# Patient Record
Sex: Male | Born: 1950
Health system: Southern US, Community
[De-identification: ages and names within clinical notes are randomized; demographics above are authoritative.]

## PROBLEM LIST (undated history)

## (undated) DIAGNOSIS — F329 Major depressive disorder, single episode, unspecified: Secondary | ICD-10-CM

## (undated) DIAGNOSIS — F419 Anxiety disorder, unspecified: Secondary | ICD-10-CM

## (undated) DIAGNOSIS — Z8546 Personal history of malignant neoplasm of prostate: Secondary | ICD-10-CM

## (undated) DIAGNOSIS — C801 Malignant (primary) neoplasm, unspecified: Secondary | ICD-10-CM

## (undated) DIAGNOSIS — R011 Cardiac murmur, unspecified: Secondary | ICD-10-CM

## (undated) DIAGNOSIS — K219 Gastro-esophageal reflux disease without esophagitis: Secondary | ICD-10-CM

## (undated) DIAGNOSIS — F4001 Agoraphobia with panic disorder: Secondary | ICD-10-CM

## (undated) DIAGNOSIS — L570 Actinic keratosis: Secondary | ICD-10-CM

## (undated) DIAGNOSIS — K769 Liver disease, unspecified: Secondary | ICD-10-CM

## (undated) DIAGNOSIS — F32A Depression, unspecified: Secondary | ICD-10-CM

## (undated) DIAGNOSIS — N289 Disorder of kidney and ureter, unspecified: Secondary | ICD-10-CM

## (undated) HISTORY — DX: Major depressive disorder, single episode, unspecified: F32.9

## (undated) HISTORY — DX: Cardiac murmur, unspecified: R01.1

## (undated) HISTORY — DX: Agoraphobia with panic disorder: F40.01

## (undated) HISTORY — DX: Personal history of malignant neoplasm of prostate: Z85.46

## (undated) HISTORY — DX: Depression, unspecified: F32.A

## (undated) HISTORY — PX: PROSTATE SURGERY: SHX751

## (undated) HISTORY — DX: Malignant (primary) neoplasm, unspecified: C80.1

## (undated) HISTORY — DX: Actinic keratosis: L57.0

## (undated) HISTORY — DX: Anxiety disorder, unspecified: F41.9

## (undated) HISTORY — DX: Liver disease, unspecified: K76.9

## (undated) HISTORY — DX: Disorder of kidney and ureter, unspecified: N28.9

## (undated) HISTORY — DX: Gastro-esophageal reflux disease without esophagitis: K21.9

## (undated) HISTORY — PX: ANKLE SURGERY: SHX546

---

## 2005-02-02 ENCOUNTER — Encounter: Admission: RE | Admit: 2005-02-02 | Discharge: 2005-02-02 | Payer: Self-pay | Admitting: Neurological Surgery

## 2006-06-03 ENCOUNTER — Ambulatory Visit: Payer: Self-pay | Admitting: Family Medicine

## 2006-12-16 ENCOUNTER — Ambulatory Visit: Payer: Self-pay | Admitting: Gastroenterology

## 2006-12-16 LAB — HM COLONOSCOPY

## 2008-05-08 ENCOUNTER — Ambulatory Visit: Payer: Self-pay

## 2010-08-17 ENCOUNTER — Encounter: Payer: Self-pay | Admitting: Neurological Surgery

## 2010-11-18 ENCOUNTER — Emergency Department: Payer: Self-pay | Admitting: Emergency Medicine

## 2012-04-11 ENCOUNTER — Ambulatory Visit: Payer: Self-pay | Admitting: Family Medicine

## 2012-12-14 ENCOUNTER — Ambulatory Visit: Payer: Self-pay | Admitting: Family Medicine

## 2013-05-19 ENCOUNTER — Ambulatory Visit: Payer: Self-pay | Admitting: Family Medicine

## 2013-06-08 DIAGNOSIS — Z8546 Personal history of malignant neoplasm of prostate: Secondary | ICD-10-CM | POA: Insufficient documentation

## 2013-06-08 DIAGNOSIS — R339 Retention of urine, unspecified: Secondary | ICD-10-CM | POA: Insufficient documentation

## 2013-06-08 DIAGNOSIS — N529 Male erectile dysfunction, unspecified: Secondary | ICD-10-CM | POA: Insufficient documentation

## 2013-06-08 DIAGNOSIS — N393 Stress incontinence (female) (male): Secondary | ICD-10-CM | POA: Insufficient documentation

## 2013-06-09 ENCOUNTER — Encounter: Payer: Self-pay | Admitting: Cardiovascular Disease

## 2014-05-31 ENCOUNTER — Ambulatory Visit: Payer: Self-pay | Admitting: Family Medicine

## 2014-11-20 DIAGNOSIS — Z859 Personal history of malignant neoplasm, unspecified: Secondary | ICD-10-CM | POA: Insufficient documentation

## 2015-01-17 ENCOUNTER — Ambulatory Visit: Admitting: Psychiatry

## 2015-01-21 ENCOUNTER — Other Ambulatory Visit: Payer: Self-pay | Admitting: Psychiatry

## 2015-01-21 ENCOUNTER — Other Ambulatory Visit: Payer: Self-pay | Admitting: Family Medicine

## 2015-01-21 DIAGNOSIS — N289 Disorder of kidney and ureter, unspecified: Secondary | ICD-10-CM

## 2015-01-21 DIAGNOSIS — R739 Hyperglycemia, unspecified: Secondary | ICD-10-CM

## 2015-01-24 ENCOUNTER — Other Ambulatory Visit: Payer: Self-pay | Admitting: Family Medicine

## 2015-01-25 LAB — RENAL FUNCTION PANEL
ALBUMIN: 4.5 g/dL (ref 3.6–4.8)
BUN/Creatinine Ratio: 13 (ref 10–22)
BUN: 19 mg/dL (ref 8–27)
CO2: 25 mmol/L (ref 18–29)
CREATININE: 1.42 mg/dL — AB (ref 0.76–1.27)
Calcium: 9.8 mg/dL (ref 8.6–10.2)
Chloride: 97 mmol/L (ref 97–108)
GFR, EST AFRICAN AMERICAN: 60 mL/min/{1.73_m2} (ref >59)
GFR, EST NON AFRICAN AMERICAN: 52 mL/min/{1.73_m2} — AB (ref >59)
GLUCOSE: 98 mg/dL (ref 65–99)
PHOSPHORUS: 3.2 mg/dL (ref 2.5–4.5)
Potassium: 5.2 mmol/L (ref 3.5–5.2)
Sodium: 138 mmol/L (ref 134–144)

## 2015-01-31 ENCOUNTER — Other Ambulatory Visit: Payer: Self-pay

## 2015-01-31 ENCOUNTER — Encounter: Payer: Self-pay | Admitting: Psychiatry

## 2015-01-31 ENCOUNTER — Ambulatory Visit (INDEPENDENT_AMBULATORY_CARE_PROVIDER_SITE_OTHER): Payer: 59 | Admitting: Psychiatry

## 2015-01-31 VITALS — BP 138/82 | HR 82 | Temp 97.3°F | Ht 72.0 in | Wt 216.0 lb

## 2015-01-31 DIAGNOSIS — F4001 Agoraphobia with panic disorder: Secondary | ICD-10-CM | POA: Diagnosis not present

## 2015-01-31 DIAGNOSIS — F3342 Major depressive disorder, recurrent, in full remission: Secondary | ICD-10-CM | POA: Insufficient documentation

## 2015-01-31 DIAGNOSIS — F331 Major depressive disorder, recurrent, moderate: Secondary | ICD-10-CM | POA: Insufficient documentation

## 2015-01-31 MED ORDER — PROPRANOLOL HCL 10 MG PO TABS: 10.0000 mg | ORAL_TABLET | Freq: Two times a day (BID) | ORAL | Status: DC

## 2015-01-31 NOTE — Progress Notes (Signed)
BH MD/PA/NP OP Progress Note  01/31/2015 2:07 PM Richard Bean  MRN:  [id number]  Subjective:     Patient is a 64 year old male who presented for follow-up. He reported that he has been compliant with his medication and takes Lexapro on a regular basis. He reported that he stays at home and lives with his wife and mother-in-law. Patient reported that he continues to have panic attacks especially when he is been driving around and has to cross the bridge. 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 Patient reported that he sleeps well at night. He reported that he just hangs around and is trying to find and her part-time position. Patient reported that he does not have any other acute issues at this time. He is interested in medication management at this time.    Chief Complaint:  Chief Complaint    Follow-up; Anxiety     Visit Diagnosis:     ICD-9-CM ICD-10-CM   1. Panic disorder with agoraphobia and mild panic attacks 300.21 F40.01     Past Medical History:  Past Medical History  Diagnosis Date  . Depression   . Panic disorder with agoraphobia     Past Surgical History  Procedure Laterality Date  . Prostate surgery     Family History:  Family History  Problem Relation Age of Onset  . COPD Mother   . Prostate cancer Father   . Depression Sister   . Breast cancer Sister   . Alcohol abuse Brother    Social History:  History   Social History  . Marital Status: Married    Spouse Name: N/A  . Number of Children: N/A  . Years of Education: N/A   Social History Main Topics  . Smoking status: Former Smoker -- 1.50 packs/day for 30 years    Types: Cigarettes    Quit date: 01/31/1975  . Smokeless tobacco: Never Used  . Alcohol Use: 0.0 oz/week    0 Standard drinks or equivalent per week  . Drug Use: No  . Sexual Activity: Not on file   Other Topics Concern  . None   Social History Narrative   Additional History:  Lives with his wife.  Assessment:    Musculoskeletal: Strength & Muscle Tone: within normal limits Gait & Station: normal Patient leans: N/A  Psychiatric Specialty Exam: HPI  Review of Systems  Constitutional: Negative for chills.  HENT: Negative for hearing loss and nosebleeds.   Eyes: Negative for photophobia.  Respiratory: Negative for hemoptysis.   Cardiovascular: Negative for palpitations.  Gastrointestinal: Negative for abdominal pain.  Genitourinary: Negative for frequency.  Musculoskeletal: Positive for back pain, joint pain and neck pain.  Neurological: Negative for sensory change.  Endo/Heme/Allergies: Negative for environmental allergies.  Psychiatric/Behavioral: Negative for depression, hallucinations and substance abuse. The patient is nervous/anxious.     Blood pressure 138/82, pulse 82, temperature 97.3 F (36.3 C), temperature source Tympanic, height 6' (1.829 m), weight 216 lb (97.977 kg), SpO2 98 %.Body mass index is 29.29 kg/(m^2).  General Appearance: Casual  Eye Contact:  Fair  Speech:  Clear and Coherent  Volume:  Normal  Mood:  Anxious  Affect:  Congruent  Thought Process:  Intact  Orientation:  Full (Time, Place, and Person)  Thought Content:  WDL  Suicidal Thoughts:  No  Homicidal Thoughts:  No  Memory:  Immediate;   Fair  Judgement:  Fair  Insight:  Fair  Psychomotor Activity:  Normal  Concentration:  Fair  Recall:  [place] of Knowledge: Fair  Language: Fair  Akathisia:  No  Handed:  Right  AIMS (if indicated):    Assets:  Communication Skills Desire for Improvement Physical Health  ADL's:  Intact  Cognition: WNL  Sleep:     Is the patient at risk to self?  No. Has the patient been a risk to self in the past 6 months?  No. Has the patient been a risk to self within the distant past?  No. Is the patient a risk to others?  No. Has the patient been a risk to others in the past 6 months?  No. Has the patient been a risk to others within the distant past?   No.  Current Medications: Current Outpatient Prescriptions  Medication Sig Dispense Refill  . escitalopram (LEXAPRO) 20 MG tablet Take 1 tablet (20 mg total) by mouth daily. 90 tablet 1  . tadalafil (CIALIS) 5 MG tablet Take by mouth.     No current facility-administered medications for this visit.    Medical Decision Making:  Established Problem, Stable/Improving (1), Review and summation of old records (2) and Review of Last Therapy Session (1)  Treatment Plan Summary:Medication management   Discussed with patient what the medications and will start him on propranolol when necessary for his panic attacks and discussed with him about the side effects in detail and he demonstrated understanding. He reported that he does not take any Xanax at this time. Follow-up in 2 months or earlier   More than 50% of the time spent in psychoeducation, counseling and coordination of care.    This note was generated in part or whole with voice recognition software. Voice regonition is usually quite accurate but there are transcription errors that can and very often do occur. I apologize for any typographical errors that were not detected and corrected.   Rainey Pines 01/31/2015, 2:07 PM

## 2015-02-01 ENCOUNTER — Telehealth: Payer: Self-pay

## 2015-02-01 NOTE — Telephone Encounter (Signed)
Patient has been advised

## 2015-02-01 NOTE — Telephone Encounter (Signed)
-----   Message from Carmon Ginsberg, [place] sent at 01/31/2015  4:13 PM EDT ----- Kidney function has improved a bit and is stable. Would repeat labs in 6 months.p

## 2015-02-13 ENCOUNTER — Telehealth: Payer: Self-pay

## 2015-02-13 NOTE — Telephone Encounter (Signed)
received a fax wanting clarification on medication propranolol 10 mg  per dr. Gretel Acre she responsed (please dispanse 60 tablets pt will take 1-2 tablets prm)

## 2015-03-26 ENCOUNTER — Other Ambulatory Visit: Payer: Self-pay | Admitting: Psychiatry

## 2015-04-04 ENCOUNTER — Ambulatory Visit: Payer: Self-pay | Admitting: Psychiatry

## 2015-05-06 ENCOUNTER — Encounter: Payer: Self-pay | Admitting: Psychiatry

## 2015-05-06 ENCOUNTER — Ambulatory Visit (INDEPENDENT_AMBULATORY_CARE_PROVIDER_SITE_OTHER): Payer: 59 | Admitting: Psychiatry

## 2015-05-06 VITALS — BP 122/92 | HR 60 | Temp 97.3°F | Ht 72.0 in | Wt 218.2 lb

## 2015-05-06 DIAGNOSIS — F4001 Agoraphobia with panic disorder: Secondary | ICD-10-CM | POA: Diagnosis not present

## 2015-05-06 MED ORDER — ESCITALOPRAM OXALATE 20 MG PO TABS: 20.0000 mg | ORAL_TABLET | Freq: Every day | ORAL | Status: DC

## 2015-05-06 MED ORDER — PROPRANOLOL HCL 10 MG PO TABS: 10.0000 mg | ORAL_TABLET | Freq: Two times a day (BID) | ORAL | Status: DC | PRN

## 2015-05-06 NOTE — Progress Notes (Signed)
BH MD/PA/NP OP Progress Note  05/06/2015 1:39 PM Richard Bean  MRN:  [id number]  Subjective:     Patient is a 64 year old male who presented for follow-up. He reported that he has been compliant with his medication and  continues to have occasional panic attacks. He reported that he is able to control them. He reported that he will feel the panic attack while he is driving his car. Patient stated that he is taking propranolol on a when necessary basis. He is still living with his wife and mother-in-law. He appears happy during the interview. He stated that her Lexapro is helping him and he is taking it at bedtime. He currently denied having any adverse effects of the medication. He appeared calm and cooperative during the interview. He currently denied having any paranoia. He denied having any mood swings anger anxiety he sleeps well at night.    Chief Complaint:  Chief Complaint    Follow-up; Medication Refill     Visit Diagnosis:     ICD-9-CM ICD-10-CM   1. Panic disorder with agoraphobia 300.21 F40.01     Past Medical History:  Past Medical History  Diagnosis Date  . Depression   . Panic disorder with agoraphobia   . Cancer ([place])   . Anxiety     Past Surgical History  Procedure Laterality Date  . Prostate surgery    . Ankle surgery Right    Family History:  Family History  Problem Relation Age of Onset  . COPD Mother   . Prostate cancer Father   . Cancer - Lung Father   . Depression Sister   . Stomach cancer Sister   . Alcohol abuse Brother   . Heart block Brother   . Breast cancer Sister   . Prostate cancer Brother   . Skin cancer Brother    Social History:  Social History   Social History  . Marital Status: Married    Spouse Name: N/A  . Number of Children: N/A  . Years of Education: N/A   Social History Main Topics  . Smoking status: Current Every Day Smoker -- 1.00 packs/day for 30 years    Types: Cigarettes    Start date: 05/06/1971  . Smokeless  tobacco: Never Used  . Alcohol Use: 6.6 oz/week    0 Standard drinks or equivalent, 10 Shots of liquor, 1 Cans of beer per week  . Drug Use: No  . Sexual Activity: No   Other Topics Concern  . None   Social History Narrative   Additional History:  Lives with his wife.  Assessment:   Musculoskeletal: Strength & Muscle Tone: within normal limits Gait & Station: normal Patient leans: N/A  Psychiatric Specialty Exam: HPI   Review of Systems  Constitutional: Negative for chills.  HENT: Negative for hearing loss and nosebleeds.   Eyes: Negative for photophobia.  Respiratory: Negative for hemoptysis.   Cardiovascular: Negative for palpitations.  Gastrointestinal: Negative for abdominal pain.  Genitourinary: Negative for frequency.  Musculoskeletal: Positive for back pain, joint pain and neck pain.  Neurological: Negative for sensory change.  Endo/Heme/Allergies: Negative for environmental allergies.  Psychiatric/Behavioral: Positive for depression. Negative for hallucinations and substance abuse. The patient is nervous/anxious.   All other systems reviewed and are negative.   Blood pressure 122/92, pulse 60, temperature 97.3 F (36.3 C), temperature source Tympanic, height 6' (1.829 m), weight 218 lb 3.2 oz (98.975 kg), SpO2 97 %.Body mass index is 29.59 kg/(m^2).  General Appearance: Casual  Eye  Contact:  Fair  Speech:  Clear and Coherent  Volume:  Normal  Mood:  Anxious  Affect:  Congruent  Thought Process:  Intact  Orientation:  Full (Time, Place, and Person)  Thought Content:  WDL  Suicidal Thoughts:  No  Homicidal Thoughts:  No  Memory:  Immediate;   Fair  Judgement:  Fair  Insight:  Fair  Psychomotor Activity:  Normal  Concentration:  Fair  Recall:  [employer] of Knowledge: Fair  Language: Fair  Akathisia:  No  Handed:  Right  AIMS (if indicated):    Assets:  Communication Skills Desire for Improvement Physical Health  ADL's:  Intact  Cognition: WNL   Sleep:     Is the patient at risk to self?  No. Has the patient been a risk to self in the past 6 months?  No. Has the patient been a risk to self within the distant past?  No. Is the patient a risk to others?  No. Has the patient been a risk to others in the past 6 months?  No. Has the patient been a risk to others within the distant past?  No.  Current Medications: Current Outpatient Prescriptions  Medication Sig Dispense Refill  . escitalopram (LEXAPRO) 20 MG tablet Take 1 tablet (20 mg total) by mouth daily. 90 tablet 1  . imipramine (TOFRANIL) 25 MG tablet Take 25 mg by mouth at bedtime.    . propranolol (INDERAL) 10 MG tablet Take 1 tablet by mouth  daily as needed for anxiety and titrate up to 1 tablet  two times daily as directed 120 tablet 0  . tadalafil (CIALIS) 5 MG tablet Take by mouth.     No current facility-administered medications for this visit.    Medical Decision Making:  Established Problem, Stable/Improving (1), Review and summation of old records (2) and Review of Last Therapy Session (1)  Treatment Plan Summary:Medication management   Anxiety symptoms Patient will continue on Lexapro 20 mg by mouth daily as well as on propranolol when necessary for his panic attacks. Discussed with him about the side effects of the medication and he demonstrated understanding   Follow-up in 4 months or earlier   More than 50% of the time spent in psychoeducation, counseling and coordination of care.  Time spent with the patient 25 minutes   This note was generated in part or whole with voice recognition software. Voice regonition is usually quite accurate but there are transcription errors that can and very often do occur. I apologize for any typographical errors that were not detected and corrected.   Rainey Pines 05/06/2015, 1:39 PM

## 2015-06-03 ENCOUNTER — Other Ambulatory Visit: Payer: Self-pay | Admitting: Psychiatry

## 2015-06-05 ENCOUNTER — Other Ambulatory Visit: Payer: Self-pay

## 2015-06-05 NOTE — Telephone Encounter (Signed)
received fax that rx last filled on  05-06-15 pt will not have enough medication to due until his next appt in  09-05-15 on propranolol 10mg 

## 2015-06-11 MED ORDER — PROPRANOLOL HCL 10 MG PO TABS: 10.0000 mg | ORAL_TABLET | Freq: Two times a day (BID) | ORAL | Status: DC | PRN

## 2015-06-17 ENCOUNTER — Encounter: Payer: Self-pay | Admitting: Family Medicine

## 2015-06-17 ENCOUNTER — Ambulatory Visit (INDEPENDENT_AMBULATORY_CARE_PROVIDER_SITE_OTHER): Payer: 59 | Admitting: Family Medicine

## 2015-06-17 VITALS — BP 130/76 | HR 102 | Temp 97.9°F | Resp 16 | Ht 72.0 in | Wt 222.8 lb

## 2015-06-17 DIAGNOSIS — Z23 Encounter for immunization: Secondary | ICD-10-CM | POA: Diagnosis not present

## 2015-06-17 DIAGNOSIS — Z0000 Encounter for general adult medical examination without abnormal findings: Secondary | ICD-10-CM

## 2015-06-17 DIAGNOSIS — R12 Heartburn: Secondary | ICD-10-CM | POA: Diagnosis not present

## 2015-06-17 NOTE — Progress Notes (Addendum)
Subjective:     Patient ID: Richard Bean, male   DOB: 1951/02/25, 64 y.o.   MRN: [id number]  HPI  Chief Complaint  Patient presents with  . Annual Exam    Patient comes in office today for his annual physical he states that he has no questions or concerns. Patient is due for today  Flu vaccine., Patient states that he has had Tdap was within the last ten years, last reported Shingles was 05/24/2014. 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   Continues to be followed by Dr. Gretel Acre for panic disorder and Dr. Jacqlyn Larsen for E.D., incontinence, and surveillance after prostate cancer. States he walks 1-2 miles daily with his dog.    Review of Systems General: Feeling well. Will defer pneumovax and Hep C screen until next year. Does not wish to stop smoking at this time (formerly smoked 2.5 ppd now 1 ppd.). Caffeine consumption is 3 cups of coffee daily. HEENT: regular dental visits and is pending eye exam. Cardiovascular: no chest pain, shortness of breath, or palpitations. Does not wish to be on medication for cholesterol so wishes to defer lipid screen. GI: Daily heartburn for which he takes 6 antacids daily. No change in bowel habits. Normal colonoscopy in 2008. GU: nocturia 0-1. no change in bladder habits  Psychiatric: not depressed. Stress over depressed mother-in-law living with them. Musculoskeletal: intermittent left hip pain.    Objective:   Physical Exam  Constitutional: He appears well-developed and well-nourished. No distress.  Eyes: PERRLA, EOMI Neck: no thyromegaly, tenderness or nodules, no cervical adenopathy, no carotid bruits ENT: TM's intact without inflammation; No tonsillar enlargement or exudate,. Lungs: Clear Heart : regularly irregular without murmur  Abd: bowel sounds present, soft, non-tender, no organomegaly Rectal: deferred to urology Extremities: no lateral left hip  tenderness; Can perform IR/ER without discomfort in groin area. Skin: no atypical lesions noted on his back.     Assessment:    1. Annual physical exam - Comprehensive metabolic panel  2. Need for influenza vaccination - Flu Vaccine QUAD 36+ mos IM  3. Heartburn    Plan:    Discussed use of Prevacid otc. Further f/u pending lab work.

## 2015-06-17 NOTE — Patient Instructions (Signed)
Discussed use of Prevacid 15 mg. two daily for 2-4 weeks for heartburn.

## 2015-06-18 NOTE — Addendum Note (Signed)
Addended by: Quay Burow on: 06/18/2015 07:38 AM   Modules accepted: Miquel Dunn

## 2015-06-26 NOTE — Progress Notes (Signed)
According to note on  05-06-15 pt to take lexapro and the inderal was refilled on 06-06-15

## 2015-07-09 DIAGNOSIS — Z0000 Encounter for general adult medical examination without abnormal findings: Secondary | ICD-10-CM | POA: Diagnosis not present

## 2015-07-10 ENCOUNTER — Telehealth: Payer: Self-pay

## 2015-07-10 LAB — COMPREHENSIVE METABOLIC PANEL
ALK PHOS: 113 IU/L (ref 39–117)
ALT: 18 IU/L (ref 0–44)
AST: 19 IU/L (ref 0–40)
Albumin/Globulin Ratio: 2 (ref 1.1–2.5)
Albumin: 4 g/dL (ref 3.6–4.8)
BUN/Creatinine Ratio: 11 (ref 10–22)
BUN: 20 mg/dL (ref 8–27)
Bilirubin Total: 0.3 mg/dL (ref 0.0–1.2)
CALCIUM: 9.9 mg/dL (ref 8.6–10.2)
CO2: 26 mmol/L (ref 18–29)
CREATININE: 1.76 mg/dL — AB (ref 0.76–1.27)
Chloride: 98 mmol/L (ref 96–106)
GFR calc Af Amer: 46 mL/min/{1.73_m2} — ABNORMAL LOW (ref >59)
GFR, EST NON AFRICAN AMERICAN: 40 mL/min/{1.73_m2} — AB (ref >59)
GLUCOSE: 94 mg/dL (ref 65–99)
Globulin, Total: 2 g/dL (ref 1.5–4.5)
Potassium: 4.3 mmol/L (ref 3.5–5.2)
Sodium: 139 mmol/L (ref 134–144)
Total Protein: 6 g/dL (ref 6.0–8.5)

## 2015-07-10 NOTE — Telephone Encounter (Signed)
LMTCB

## 2015-07-10 NOTE — Telephone Encounter (Signed)
-----   Message from Carmon Ginsberg, [place] sent at 07/10/2015  7:57 AM EST ----- Kidney function has declined again. Are you regularly using antiinflammatory medication like aleve or ibuprofen which may affect kidney function? If not would recommend seeing a kidney doctor to sort this out. You don't have the usual risk factors-diabetes or hypertension-which usually contribute to kidney decline.

## 2015-07-15 ENCOUNTER — Other Ambulatory Visit: Payer: Self-pay | Admitting: Family Medicine

## 2015-07-15 DIAGNOSIS — N183 Chronic kidney disease, stage 3 unspecified: Secondary | ICD-10-CM

## 2015-07-15 NOTE — Telephone Encounter (Signed)
Referral in progress. 

## 2015-07-15 NOTE — Telephone Encounter (Signed)
Patient was advised of report he states that he stopped taking antiinflammatories years ago, he would like to proceed with seeing a kidney doctor to discuss issue. KW

## 2015-08-06 DIAGNOSIS — I129 Hypertensive chronic kidney disease with stage 1 through stage 4 chronic kidney disease, or unspecified chronic kidney disease: Secondary | ICD-10-CM | POA: Diagnosis not present

## 2015-08-06 DIAGNOSIS — N183 Chronic kidney disease, stage 3 (moderate): Secondary | ICD-10-CM | POA: Diagnosis not present

## 2015-08-07 DIAGNOSIS — N183 Chronic kidney disease, stage 3 (moderate): Secondary | ICD-10-CM | POA: Diagnosis not present

## 2015-09-05 ENCOUNTER — Ambulatory Visit (INDEPENDENT_AMBULATORY_CARE_PROVIDER_SITE_OTHER): Payer: Medicare Other | Admitting: Psychiatry

## 2015-09-05 ENCOUNTER — Encounter: Payer: Self-pay | Admitting: Psychiatry

## 2015-09-05 VITALS — BP 148/90 | HR 48 | Temp 98.1°F | Ht 72.0 in | Wt 227.0 lb

## 2015-09-05 DIAGNOSIS — F4001 Agoraphobia with panic disorder: Secondary | ICD-10-CM

## 2015-09-05 MED ORDER — ESCITALOPRAM OXALATE 20 MG PO TABS: 20.0000 mg | ORAL_TABLET | Freq: Every day | ORAL | Status: DC

## 2015-09-05 NOTE — Progress Notes (Signed)
BH MD/PA/NP OP Progress Note  09/05/2015 1:35 PM HA KRENGEL  MRN:  [id number]  Subjective:     Patient is a 65 year old male who presented for follow-up. He reported that he has been compliant with his medication and  he feels improvement in his panic attacks. 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 He reported that his last panic attack was while driving to [place]. He reported that he forgot to take the propranolol which was prescribed at his last appointment. He still has several pills. Patient reported that he takes Lexapro at night. He appeared calm and cooperative during the interview. He currently denied having any adverse effects of the medications He is still living with his wife and mother-in-law. He appears happy during the interview. He stated that her Lexapro is helping him and he is taking it at bedtime. He currently denied having any paranoia. He denied having any mood swings anger anxiety he sleeps well at night. He stated that he does not have any suicidal ideations or plans.   Chief Complaint:  Chief Complaint    Follow-up; Medication Refill     Visit Diagnosis:     ICD-9-CM ICD-10-CM   1. Panic disorder with agoraphobia and mild panic attacks 300.21 F40.01     Past Medical History:  Past Medical History  Diagnosis Date  . Depression   . Panic disorder with agoraphobia   . Cancer ([place])   . Anxiety     Past Surgical History  Procedure Laterality Date  . Prostate surgery    . Ankle surgery Right    Family History:  Family History  Problem Relation Age of Onset  . COPD Mother   . Prostate cancer Father   . Cancer - Lung Father   . Depression Sister   . Stomach cancer Sister   . Alcohol abuse Brother   . Heart block Brother   . Breast cancer Sister   . Prostate cancer Brother   . Skin cancer Brother    Social History:  Social History   Social History  . Marital Status: Married    Spouse  Name: N/A  . Number of Children: N/A  . Years of Education: N/A   Social History Main Topics  . Smoking status: Current Every Day Smoker -- 1.00 packs/day for 30 years    Types: Cigarettes    Start date: 05/06/1971  . Smokeless tobacco: Never Used  . Alcohol Use: 6.6 oz/week    1 Cans of beer, 10 Shots of liquor, 0 Standard drinks or equivalent per week  . Drug Use: No  . Sexual Activity: No   Other Topics Concern  . None   Social History Narrative   Additional History:  Lives with his wife.  Assessment:   Musculoskeletal: Strength & Muscle Tone: within normal limits Gait & Station: normal Patient leans: N/A  Psychiatric Specialty Exam: HPI   Review of Systems  Constitutional: Negative for chills.  HENT: Negative for hearing loss and nosebleeds.   Eyes: Negative for photophobia.  Respiratory: Negative for hemoptysis.   Cardiovascular: Negative for palpitations.  Gastrointestinal: Negative for abdominal pain.  Genitourinary: Negative for frequency.  Musculoskeletal: Positive for back pain, joint pain and neck pain.  Neurological: Negative for sensory change.  Endo/Heme/Allergies: Negative for environmental allergies.  Psychiatric/Behavioral: Positive for depression. Negative for hallucinations and substance abuse. The patient is nervous/anxious.   All other systems reviewed and are negative.   Blood  pressure 148/90, pulse 48, temperature 98.1 F (36.7 C), temperature source Tympanic, height 6' (1.829 m), weight 227 lb (102.967 kg), SpO2 98 %.Body mass index is 30.78 kg/(m^2).  General Appearance: Casual  Eye Contact:  Fair  Speech:  Clear and Coherent  Volume:  Normal  Mood:  Anxious  Affect:  Congruent  Thought Process:  Intact  Orientation:  Full (Time, Place, and Person)  Thought Content:  WDL  Suicidal Thoughts:  No  Homicidal Thoughts:  No  Memory:  Immediate;   Fair  Judgement:  Fair  Insight:  Fair  Psychomotor Activity:  Normal  Concentration:   Fair  Recall:  [employer] of Knowledge: Fair  Language: Fair  Akathisia:  No  Handed:  Right  AIMS (if indicated):    Assets:  Communication Skills Desire for Improvement Physical Health  ADL's:  Intact  Cognition: WNL  Sleep:     Is the patient at risk to self?  No. Has the patient been a risk to self in the past 6 months?  No. Has the patient been a risk to self within the distant past?  No. Is the patient a risk to others?  No. Has the patient been a risk to others in the past 6 months?  No. Has the patient been a risk to others within the distant past?  No.  Current Medications: Current Outpatient Prescriptions  Medication Sig Dispense Refill  . escitalopram (LEXAPRO) 20 MG tablet     . imipramine (TOFRANIL) 25 MG tablet Take 25 mg by mouth at bedtime.    . propranolol (INDERAL) 10 MG tablet Take 1 tablet by mouth two  times daily as needed 120 tablet 1  . tadalafil (CIALIS) 5 MG tablet Take by mouth.     No current facility-administered medications for this visit.    Medical Decision Making:  Established Problem, Stable/Improving (1), Review and summation of old records (2) and Review of Last Therapy Session (1)  Treatment Plan Summary:Medication management   Anxiety symptoms Patient will continue on Lexapro 20 mg by mouth daily as well as on propranolol when necessary for his panic attacks. 90 day supply of the Lexapro was filled. He has enough supply of propranolol at this time. Discussed with him about the side effects of the medication and he demonstrated understanding   Follow-up in 4 months or earlier   More than 50% of the time spent in psychoeducation, counseling and coordination of care.     This note was generated in part or whole with voice recognition software. Voice regonition is usually quite accurate but there are transcription errors that can and very often do occur. I apologize for any typographical errors that were not detected and corrected.    Rainey Pines, MD  09/05/2015, 1:35 PM

## 2015-09-06 DIAGNOSIS — N183 Chronic kidney disease, stage 3 (moderate): Secondary | ICD-10-CM | POA: Diagnosis not present

## 2015-09-06 DIAGNOSIS — I129 Hypertensive chronic kidney disease with stage 1 through stage 4 chronic kidney disease, or unspecified chronic kidney disease: Secondary | ICD-10-CM | POA: Diagnosis not present

## 2015-09-18 DIAGNOSIS — N393 Stress incontinence (female) (male): Secondary | ICD-10-CM | POA: Diagnosis not present

## 2015-09-18 DIAGNOSIS — R339 Retention of urine, unspecified: Secondary | ICD-10-CM | POA: Diagnosis not present

## 2015-09-18 DIAGNOSIS — Z8546 Personal history of malignant neoplasm of prostate: Secondary | ICD-10-CM | POA: Diagnosis not present

## 2015-09-18 DIAGNOSIS — N529 Male erectile dysfunction, unspecified: Secondary | ICD-10-CM | POA: Diagnosis not present

## 2015-09-20 DIAGNOSIS — I129 Hypertensive chronic kidney disease with stage 1 through stage 4 chronic kidney disease, or unspecified chronic kidney disease: Secondary | ICD-10-CM | POA: Diagnosis not present

## 2015-09-20 DIAGNOSIS — N183 Chronic kidney disease, stage 3 (moderate): Secondary | ICD-10-CM | POA: Diagnosis not present

## 2015-09-20 DIAGNOSIS — N400 Enlarged prostate without lower urinary tract symptoms: Secondary | ICD-10-CM | POA: Diagnosis not present

## 2015-10-15 ENCOUNTER — Encounter: Payer: Self-pay | Admitting: Family Medicine

## 2015-12-11 DIAGNOSIS — I129 Hypertensive chronic kidney disease with stage 1 through stage 4 chronic kidney disease, or unspecified chronic kidney disease: Secondary | ICD-10-CM | POA: Diagnosis not present

## 2015-12-11 DIAGNOSIS — N183 Chronic kidney disease, stage 3 (moderate): Secondary | ICD-10-CM | POA: Diagnosis not present

## 2016-01-02 ENCOUNTER — Ambulatory Visit (INDEPENDENT_AMBULATORY_CARE_PROVIDER_SITE_OTHER): Payer: Medicare Other | Admitting: Psychiatry

## 2016-01-02 VITALS — BP 130/76 | HR 92 | Temp 96.3°F | Ht 72.0 in | Wt 218.8 lb

## 2016-01-02 DIAGNOSIS — F4001 Agoraphobia with panic disorder: Secondary | ICD-10-CM

## 2016-01-02 MED ORDER — ESCITALOPRAM OXALATE 20 MG PO TABS: 20.0000 mg | ORAL_TABLET | Freq: Every day | ORAL | Status: DC

## 2016-01-02 NOTE — Progress Notes (Signed)
BH MD/PA/NP OP Progress Note  01/02/2016 1:38 PM Richard Bean  MRN:  [id number]  Subjective:     Patient is a 65 year old male who presented for follow-up. He reported that he has been compliant with his medication and he feels improvement in his panic attacks. He reported that he has not done any long road trips which leads to his panic attacks and anxiety. He reported that he is planning a trip in July to go to [place] to visit his friend. Patient reported that he has been following with his kidney doctor on a regular basis due to his kidney issues and hypertension. He has been compliant with his medications. He takes propanolol on a when necessary basis. He still has supply of the medication. He has been compliant with Lexapro and ran out of the medication yesterday. He appeared jovial and happy during the interview as usual. Does not have any sleep issues at this time. He denied having any more swings anger anxiety. He reported age he does not have any family history of psychiatric illness and his grandfather died at the age of 25 and his father died in 81s due to cancer.  He stated that he enjoys his life most of the time except for occasional anxiety and panic.  He denied having any paranoia. He  denied having any suicidal ideations or plans.  Chief Complaint:   Visit Diagnosis:     ICD-9-CM ICD-10-CM   1. Panic disorder with agoraphobia and mild panic attacks 300.21 F40.01     Past Medical History:  Past Medical History  Diagnosis Date  . Depression   . Panic disorder with agoraphobia   . Cancer ([place])   . Anxiety     Past Surgical History  Procedure Laterality Date  . Prostate surgery    . Ankle surgery Right    Family History:  Family History  Problem Relation Age of Onset  . COPD Mother   . Prostate cancer Father   . Cancer - Lung Father   . Depression Sister   . Stomach cancer Sister   . Alcohol abuse Brother   . Heart block Brother   . Breast cancer Sister   .  Prostate cancer Brother   . Skin cancer Brother    Social History:  Social History   Social History  . Marital Status: Married    Spouse Name: N/A  . Number of Children: N/A  . Years of Education: N/A   Social History Main Topics  . Smoking status: Current Every Day Smoker -- 1.00 packs/day for 30 years    Types: Cigarettes    Start date: 05/06/1971  . Smokeless tobacco: Never Used  . Alcohol Use: 6.6 oz/week    1 Cans of beer, 10 Shots of liquor, 0 Standard drinks or equivalent per week  . Drug Use: No  . Sexual Activity: No   Other Topics Concern  . Not on file   Social History Narrative   Additional History:  Lives with his wife.  Assessment:   Musculoskeletal: Strength & Muscle Tone: within normal limits Gait & Station: normal Patient leans: N/A  Psychiatric Specialty Exam: HPI  Review of Systems  Constitutional: Negative for chills.  HENT: Negative for hearing loss and nosebleeds.   Eyes: Negative for photophobia.  Respiratory: Negative for hemoptysis.   Cardiovascular: Negative for palpitations.  Gastrointestinal: Negative for abdominal pain.  Genitourinary: Negative for frequency.  Musculoskeletal: Positive for back pain, joint pain and neck pain.  Neurological:  Negative for sensory change.  Endo/Heme/Allergies: Negative for environmental allergies.  Psychiatric/Behavioral: Negative for hallucinations and substance abuse.  All other systems reviewed and are negative.   There were no vitals taken for this visit.There is no weight on file to calculate BMI.  General Appearance: Casual  Eye Contact:  Fair  Speech:  Clear and Coherent  Volume:  Normal  Mood:  Anxious  Affect:  Congruent  Thought Process:  Intact  Orientation:  Full (Time, Place, and Person)  Thought Content:  WDL  Suicidal Thoughts:  No  Homicidal Thoughts:  No  Memory:  Immediate;   Fair  Judgement:  Fair  Insight:  Fair  Psychomotor Activity:  Normal  Concentration:  Fair   Recall:  [employer] of Knowledge: Fair  Language: Fair  Akathisia:  No  Handed:  Right  AIMS (if indicated):    Assets:  Communication Skills Desire for Improvement Physical Health  ADL's:  Intact  Cognition: WNL  Sleep:     Is the patient at risk to self?  No. Has the patient been a risk to self in the past 6 months?  No. Has the patient been a risk to self within the distant past?  No. Is the patient a risk to others?  No. Has the patient been a risk to others in the past 6 months?  No. Has the patient been a risk to others within the distant past?  No.  Current Medications: Current Outpatient Prescriptions  Medication Sig Dispense Refill  . escitalopram (LEXAPRO) 20 MG tablet Take 1 tablet (20 mg total) by mouth daily. 90 tablet 3  . imipramine (TOFRANIL) 25 MG tablet Take 25 mg by mouth at bedtime.    . propranolol (INDERAL) 10 MG tablet Take 1 tablet by mouth two  times daily as needed 120 tablet 1  . tadalafil (CIALIS) 5 MG tablet Take by mouth.     No current facility-administered medications for this visit.    Medical Decision Making:  Established Problem, Stable/Improving (1), Review and summation of old records (2) and Review of Last Therapy Session (1)  Treatment Plan Summary:Medication management   Anxiety symptoms Patient will continue on Lexapro 20 mg by mouth daily as well as on propranolol when necessary for his panic attacks.  90 day supply of the Lexapro was filled. I will also prescribe 15 day supply of the Lexapro as he ran out of the medication to bridge the prescription before he can get his 90 day supply.  He has enough supply of propranolol at this time. Discussed with him about the side effects of the medication and he demonstrated understanding   Follow-up in 4 months or earlier   More than 50% of the time spent in psychoeducation, counseling and coordination of care.     This note was generated in part or whole with voice recognition  software. Voice regonition is usually quite accurate but there are transcription errors that can and very often do occur. I apologize for any typographical errors that were not detected and corrected.   Rainey Pines, MD  01/02/2016, 1:38 PM

## 2016-02-08 ENCOUNTER — Other Ambulatory Visit: Payer: Self-pay | Admitting: Psychiatry

## 2016-02-10 NOTE — Telephone Encounter (Signed)
Pt was given 90 days supply of medication  With 3 refills at last appt

## 2016-03-16 ENCOUNTER — Other Ambulatory Visit: Payer: Self-pay | Admitting: Family Medicine

## 2016-03-16 DIAGNOSIS — N5234 Erectile dysfunction following simple prostatectomy: Secondary | ICD-10-CM

## 2016-03-16 MED ORDER — TADALAFIL 5 MG PO TABS: 5.0000 mg | ORAL_TABLET | Freq: Every day | ORAL | 11 refills | Status: DC

## 2016-04-30 ENCOUNTER — Ambulatory Visit (INDEPENDENT_AMBULATORY_CARE_PROVIDER_SITE_OTHER): Payer: Medicare Other | Admitting: Psychiatry

## 2016-04-30 ENCOUNTER — Encounter: Payer: Self-pay | Admitting: Psychiatry

## 2016-04-30 VITALS — BP 150/79 | HR 48 | Temp 97.8°F | Wt 219.0 lb

## 2016-04-30 DIAGNOSIS — F4001 Agoraphobia with panic disorder: Secondary | ICD-10-CM

## 2016-04-30 MED ORDER — ESCITALOPRAM OXALATE 20 MG PO TABS: 20.0000 mg | ORAL_TABLET | Freq: Every day | ORAL | 3 refills | Status: DC

## 2016-04-30 NOTE — Progress Notes (Signed)
BH MD/PA/NP OP Progress Note  04/30/2016 1:28 PM Richard Bean  MRN:  [id number]  Subjective:     Patient is a 65 year old male who presented for follow-up. He reported that he has been compliant with his medication. Patient appeared happy and calm during the interview. He reported that he has been compliant with his medications and feels around the house. He was happy as usual. No acute symptoms noted during the interview. He sleeps well at night. He denied having any suicidal homicidal ideations or plans.  He currently lives with his wife and his mother-in-law. He has good relationship with them. He currently smokes 1 pack of cigarettes on a daily basis. We discussed about smoking cessation and he reported that he has tried in the past but was not successful. He does not want to stop smoking at this time.    He denied having any paranoia. He  denied having any suicidal ideations or plans.  Chief Complaint:  Chief Complaint    Follow-up; Medication Refill     Visit Diagnosis:   No diagnosis found.  Past Medical History:  Past Medical History:  Diagnosis Date  . Anxiety   . Cancer ([place])   . Depression   . Panic disorder with agoraphobia     Past Surgical History:  Procedure Laterality Date  . ANKLE SURGERY Right   . PROSTATE SURGERY     Family History:  Family History  Problem Relation Age of Onset  . COPD Mother   . Prostate cancer Father   . Cancer - Lung Father   . Depression Sister   . Stomach cancer Sister   . Alcohol abuse Brother   . Heart block Brother   . Breast cancer Sister   . Prostate cancer Brother   . Skin cancer Brother    Social History:  Social History   Social History  . Marital status: Married    Spouse name: N/A  . Number of children: N/A  . Years of education: N/A   Social History Main Topics  . Smoking status: Current Every Day Smoker    Packs/day: 1.00    Years: 30.00    Types: Cigarettes    Start date: 05/06/1971  . Smokeless  tobacco: Never Used  . Alcohol use 6.6 oz/week    1 Cans of beer, 10 Shots of liquor per week  . Drug use: No  . Sexual activity: No   Other Topics Concern  . None   Social History Narrative  . None   Additional History:  Lives with his wife.  Assessment:   Musculoskeletal: Strength & Muscle Tone: within normal limits Gait & Station: normal Patient leans: N/A  Psychiatric Specialty Exam: Medication Refill  Associated symptoms include neck pain. Pertinent negatives include no abdominal pain or chills.    Review of Systems  Constitutional: Negative for chills.  HENT: Negative for hearing loss and nosebleeds.   Eyes: Negative for photophobia.  Respiratory: Negative for hemoptysis.   Cardiovascular: Negative for palpitations.  Gastrointestinal: Negative for abdominal pain.  Genitourinary: Negative for frequency.  Musculoskeletal: Positive for back pain, joint pain and neck pain.  Neurological: Negative for sensory change.  Endo/Heme/Allergies: Negative for environmental allergies.  Psychiatric/Behavioral: Negative for hallucinations and substance abuse.  All other systems reviewed and are negative.   Blood pressure (!) 150/79, pulse (!) 48, temperature 97.8 F (36.6 C), temperature source Oral, weight 219 lb (99.3 kg).Body mass index is 29.7 kg/m.  General Appearance: Casual  Eye Contact:  Fair  Speech:  Clear and Coherent  Volume:  Normal  Mood:  Anxious  Affect:  Congruent  Thought Process:  Intact  Orientation:  Full (Time, Place, and Person)  Thought Content:  WDL  Suicidal Thoughts:  No  Homicidal Thoughts:  No  Memory:  Immediate;   Fair  Judgement:  Fair  Insight:  Fair  Psychomotor Activity:  Normal  Concentration:  Fair  Recall:  [employer] of Knowledge: Fair  Language: Fair  Akathisia:  No  Handed:  Right  AIMS (if indicated):    Assets:  Communication Skills Desire for Improvement Physical Health  ADL's:  Intact  Cognition: WNL  Sleep:      Is the patient at risk to self?  No. Has the patient been a risk to self in the past 6 months?  No. Has the patient been a risk to self within the distant past?  No. Is the patient a risk to others?  No. Has the patient been a risk to others in the past 6 months?  No. Has the patient been a risk to others within the distant past?  No.  Current Medications: Current Outpatient Prescriptions  Medication Sig Dispense Refill  . amLODipine (NORVASC) 5 MG tablet Take 5 mg by mouth daily.    . enalapril (VASOTEC) 5 MG tablet Take 5 mg by mouth daily.    Marland Kitchen escitalopram (LEXAPRO) 20 MG tablet Take 1 tablet (20 mg total) by mouth daily. 90 tablet 3  . escitalopram (LEXAPRO) 20 MG tablet Take 1 tablet (20 mg total) by mouth daily. 15 tablet 0  . imipramine (TOFRANIL) 25 MG tablet Take 25 mg by mouth at bedtime.    . propranolol (INDERAL) 10 MG tablet Take 1 tablet by mouth two  times daily as needed 120 tablet 1  . tadalafil (CIALIS) 5 MG tablet Take 1 tablet (5 mg total) by mouth daily. 30 tablet 11   No current facility-administered medications for this visit.     Medical Decision Making:  Established Problem, Stable/Improving (1), Review and summation of old records (2) and Review of Last Therapy Session (1)  Treatment Plan Summary:Medication management   Anxiety symptoms Patient will continue on Lexapro 20 mg by mouth daily.  90 day supply of the Lexapro was filled.   He has enough supply of propranolol at this time. Discussed with him about the side effects of the medication and he demonstrated understanding  Advised patient about my departure from this practice and he demonstrated understanding. He will make a follow-up appointment with the next provider.   Follow-up in 6 months or earlier   More than 50% of the time spent in psychoeducation, counseling and coordination of care.     This note was generated in part or whole with voice recognition software. Voice regonition is  usually quite accurate but there are transcription errors that can and very often do occur. I apologize for any typographical errors that were not detected and corrected.   Rainey Pines, MD  04/30/2016, 1:28 PM

## 2016-06-04 ENCOUNTER — Ambulatory Visit (INDEPENDENT_AMBULATORY_CARE_PROVIDER_SITE_OTHER): Payer: Medicare Other

## 2016-06-04 DIAGNOSIS — Z23 Encounter for immunization: Secondary | ICD-10-CM | POA: Diagnosis not present

## 2016-06-04 DIAGNOSIS — I129 Hypertensive chronic kidney disease with stage 1 through stage 4 chronic kidney disease, or unspecified chronic kidney disease: Secondary | ICD-10-CM | POA: Diagnosis not present

## 2016-06-04 DIAGNOSIS — N183 Chronic kidney disease, stage 3 (moderate): Secondary | ICD-10-CM | POA: Diagnosis not present

## 2016-07-30 ENCOUNTER — Ambulatory Visit (INDEPENDENT_AMBULATORY_CARE_PROVIDER_SITE_OTHER): Payer: Medicare Other | Admitting: Psychiatry

## 2016-07-30 ENCOUNTER — Encounter: Payer: Self-pay | Admitting: Psychiatry

## 2016-07-30 VITALS — BP 178/90 | HR 97 | Temp 97.6°F | Wt 227.0 lb

## 2016-07-30 DIAGNOSIS — F4001 Agoraphobia with panic disorder: Secondary | ICD-10-CM

## 2016-07-30 MED ORDER — ESCITALOPRAM OXALATE 20 MG PO TABS: 20.0000 mg | ORAL_TABLET | Freq: Every day | ORAL | 3 refills | Status: DC

## 2016-07-30 NOTE — Progress Notes (Signed)
BH MD/PA/NP OP Progress Note  07/30/2016 2:17 PM Richard Bean  MRN:  [id number]  Subjective:     Patient is a 66 year old male who presented for follow-up. He was very excited to see I'm still his outpatient provider. He reported that he was going to see another provider in the office. Patient reported that he did not enjoy over the holidays as he was spending time with his mother-in-law and she was acting like a "grinch". Patient continues to be pleasant and cooperative during the interview. He stated that he sleeps between 4-8 hours and sometimes feels tired. He has been compliant with his medications. He has been happy and jovial as usual. He denied having any suicidal homicidal ideations or plans. He reported that he goes out with his friends when the weather is nice. He does not have any acute symptoms at this time. He denied having any perceptual disturbances. He denied having any suicidal homicidal ideations or plans.    He currently smokes 1 pack of cigarettes on a daily basis.   Chief Complaint:  Chief Complaint    Follow-up; Medication Refill     Visit Diagnosis:   No diagnosis found.  Past Medical History:  Past Medical History:  Diagnosis Date  . Anxiety   . Cancer ([place])   . Depression   . Panic disorder with agoraphobia     Past Surgical History:  Procedure Laterality Date  . ANKLE SURGERY Right   . PROSTATE SURGERY     Family History:  Family History  Problem Relation Age of Onset  . COPD Mother   . Prostate cancer Father   . Cancer - Lung Father   . Depression Sister   . Stomach cancer Sister   . Alcohol abuse Brother   . Heart block Brother   . Breast cancer Sister   . Prostate cancer Brother   . Skin cancer Brother    Social History:  Social History   Social History  . Marital status: Married    Spouse name: N/A  . Number of children: N/A  . Years of education: N/A   Social History Main Topics  . Smoking status: Current Every Day Smoker   Packs/day: 1.00    Years: 30.00    Types: Cigarettes    Start date: 05/06/1971  . Smokeless tobacco: Never Used  . Alcohol use 6.6 oz/week    1 Cans of beer, 10 Shots of liquor per week  . Drug use: No  . Sexual activity: No   Other Topics Concern  . None   Social History Narrative  . None   Additional History:  Lives with his wife.  Assessment:   Musculoskeletal: Strength & Muscle Tone: within normal limits Gait & Station: normal Patient leans: N/A  Psychiatric Specialty Exam: Medication Refill  Associated symptoms include neck pain. Pertinent negatives include no abdominal pain or chills.    Review of Systems  Constitutional: Negative for chills.  HENT: Negative for hearing loss and nosebleeds.   Eyes: Negative for photophobia.  Respiratory: Negative for hemoptysis.   Cardiovascular: Negative for palpitations.  Gastrointestinal: Negative for abdominal pain.  Genitourinary: Negative for frequency.  Musculoskeletal: Positive for back pain, joint pain and neck pain.  Neurological: Negative for sensory change.  Endo/Heme/Allergies: Negative for environmental allergies.  Psychiatric/Behavioral: Negative for hallucinations and substance abuse.  All other systems reviewed and are negative.   Blood pressure (!) 178/90, pulse 97, temperature 97.6 F (36.4 C), temperature source Oral, weight 227 lb (  103 kg).Body mass index is 30.79 kg/m.  General Appearance: Casual  Eye Contact:  Fair  Speech:  Clear and Coherent  Volume:  Normal  Mood:  Anxious  Affect:  Congruent  Thought Process:  Intact  Orientation:  Full (Time, Place, and Person)  Thought Content:  WDL  Suicidal Thoughts:  No  Homicidal Thoughts:  No  Memory:  Immediate;   Fair  Judgement:  Fair  Insight:  Fair  Psychomotor Activity:  Normal  Concentration:  Fair  Recall:  [employer] of Knowledge: Fair  Language: Fair  Akathisia:  No  Handed:  Right  AIMS (if indicated):    Assets:  Communication  Skills Desire for Improvement Physical Health  ADL's:  Intact  Cognition: WNL  Sleep:     Is the patient at risk to self?  No. Has the patient been a risk to self in the past 6 months?  No. Has the patient been a risk to self within the distant past?  No. Is the patient a risk to others?  No. Has the patient been a risk to others in the past 6 months?  No. Has the patient been a risk to others within the distant past?  No.  Current Medications: Current Outpatient Prescriptions  Medication Sig Dispense Refill  . amLODipine (NORVASC) 5 MG tablet Take 5 mg by mouth daily.    . enalapril (VASOTEC) 5 MG tablet Take 5 mg by mouth daily.    Marland Kitchen escitalopram (LEXAPRO) 20 MG tablet Take 1 tablet (20 mg total) by mouth daily. 90 tablet 3  . imipramine (TOFRANIL) 25 MG tablet Take 25 mg by mouth at bedtime.    . propranolol (INDERAL) 10 MG tablet Take 1 tablet by mouth two  times daily as needed 120 tablet 1  . tadalafil (CIALIS) 5 MG tablet Take 1 tablet (5 mg total) by mouth daily. 30 tablet 11   No current facility-administered medications for this visit.     Medical Decision Making:  Established Problem, Stable/Improving (1), Review and summation of old records (2) and Review of Last Therapy Session (1)  Treatment Plan Summary:Medication management   Anxiety symptoms Patient will continue on Lexapro 20 mg by mouth daily.  90 day supply of the Lexapro was filled.   He has enough supply of propranolol at this time as he only takes it on a when necessary basis. Discussed with him about the side effects of the medication and he demonstrated understanding  Follow-up in 3 months or earlier   More than 50% of the time spent in psychoeducation, counseling and coordination of care.     This note was generated in part or whole with voice recognition software. Voice regonition is usually quite accurate but there are transcription errors that can and very often do occur. I apologize for any  typographical errors that were not detected and corrected.   Rainey Pines, MD  07/30/2016, 2:17 PM

## 2016-10-08 DIAGNOSIS — N529 Male erectile dysfunction, unspecified: Secondary | ICD-10-CM | POA: Diagnosis not present

## 2016-10-08 DIAGNOSIS — N393 Stress incontinence (female) (male): Secondary | ICD-10-CM | POA: Diagnosis not present

## 2016-10-08 DIAGNOSIS — R339 Retention of urine, unspecified: Secondary | ICD-10-CM | POA: Diagnosis not present

## 2016-10-08 DIAGNOSIS — Z8546 Personal history of malignant neoplasm of prostate: Secondary | ICD-10-CM | POA: Diagnosis not present

## 2016-10-12 ENCOUNTER — Encounter: Payer: Self-pay | Admitting: Family Medicine

## 2016-10-12 ENCOUNTER — Ambulatory Visit (INDEPENDENT_AMBULATORY_CARE_PROVIDER_SITE_OTHER): Payer: Medicare Other | Admitting: Family Medicine

## 2016-10-12 VITALS — BP 130/80 | HR 76 | Temp 97.8°F | Resp 16 | Ht 72.0 in | Wt 223.6 lb

## 2016-10-12 DIAGNOSIS — I129 Hypertensive chronic kidney disease with stage 1 through stage 4 chronic kidney disease, or unspecified chronic kidney disease: Secondary | ICD-10-CM | POA: Diagnosis not present

## 2016-10-12 DIAGNOSIS — Z0000 Encounter for general adult medical examination without abnormal findings: Secondary | ICD-10-CM | POA: Diagnosis not present

## 2016-10-12 DIAGNOSIS — F331 Major depressive disorder, recurrent, moderate: Secondary | ICD-10-CM

## 2016-10-12 DIAGNOSIS — Z13220 Encounter for screening for lipoid disorders: Secondary | ICD-10-CM

## 2016-10-12 DIAGNOSIS — Z8546 Personal history of malignant neoplasm of prostate: Secondary | ICD-10-CM | POA: Diagnosis not present

## 2016-10-12 DIAGNOSIS — Z131 Encounter for screening for diabetes mellitus: Secondary | ICD-10-CM

## 2016-10-12 DIAGNOSIS — M5030 Other cervical disc degeneration, unspecified cervical region: Secondary | ICD-10-CM | POA: Insufficient documentation

## 2016-10-12 DIAGNOSIS — Z23 Encounter for immunization: Secondary | ICD-10-CM | POA: Diagnosis not present

## 2016-10-12 DIAGNOSIS — N183 Chronic kidney disease, stage 3 unspecified: Secondary | ICD-10-CM

## 2016-10-12 DIAGNOSIS — Z720 Tobacco use: Secondary | ICD-10-CM | POA: Diagnosis not present

## 2016-10-12 DIAGNOSIS — I10 Essential (primary) hypertension: Secondary | ICD-10-CM | POA: Insufficient documentation

## 2016-10-12 MED ORDER — TRAMADOL HCL 50 MG PO TABS: 50.0000 mg | ORAL_TABLET | Freq: Four times a day (QID) | ORAL | 0 refills | Status: DC | PRN

## 2016-10-12 NOTE — Patient Instructions (Addendum)
We will call you with the lab results. Consider exercising 30 minutes daily. Use Tylenol for mild neck pain; tramadol for severe pain. Let me know if it works.

## 2016-10-12 NOTE — Progress Notes (Addendum)
Subjective:     Patient ID: Richard Bean, male   DOB: 1951/04/25, 66 y.o.   MRN: [id number]  HPI  Chief Complaint  Patient presents with  . Annual Exam    Patient comes in office today for his annual physical, he states that he is feeling well and has no conerns to address today. Patient is working on improving his diet, he is not actively exercising, patient averages between 5-8hrs of sleep at night. Patient is due today for TDap but has declined stating he has had in the last 10 yrs . Patient last reported colonoscopy 12/16/06, Flu vaccine 06/04/16, Zoster vaccine 05/24/14.   States he remains retired and is pending divorce from his wife: "I'm moving to a new house." He is up to date with urology, Dr. Jacqlyn Larsen; psychiatry, Dr.Faheem; and renal, Dr. [name].    Review of Systems General: Feeling well, does not wish to stop smoking at this time. HEENT: regular dental visits and is pending eye exam (glasses) Cardiovascular: no chest pain, shortness of breath, or palpitations GI: occasional heartburn he treats with antacids. no change in bowel habits. He is due his 10 year f/u colonoscopy but wishes to wait. GU: nocturia x0- 1, no change in bladder habits. Followed by urology Neuro: no change in memory Psychiatric: not depressed- followed by psychiatry Musculoskeletal: cervical DDD has worsened but non-radicular. Takes tylenol with modest relief. Can not take nsaid's due to CKD. Had a tripping episode at home two months ago with residual right chest wall pain.    Objective:   Physical Exam Eyes: PERRLA, EOMI Neck: no thyromegaly, tenderness or nodules, no cervical adenopathy, no carotid bruits ENT: TM's intact without inflammation; No tonsillar enlargement or exudate, Lungs: Clear Heart : RRR without murmur or gallop Abd: bowel sounds present, soft, non-tender, no organomegaly Extremities: no edema Neuro: 1.What year is it?          0 or 4-----0 2.What month is it?       0 or  3---0 3.Remember the following address (ask patient to repeat and remember for later)                           Janice Norrie                          [address] [place]  4.What time is it?          0 or 4------0 5. Count backwards from 20-1     0 or 2 (1 error) or 4-----0 6. Months of the year backwards       0 or 2 or 4-----0 7. Repeat previous memory phrase   0 or 3 (one error) or 4 (2 errors) or 6 (3 errors) or 8 (4 errors) or 10 (all incorrect)------0 ______________________________________________________________________ Total score: 0 0-7 normal   8-9 mild cognitive impairment    10-28 significant cognitive impairment    Assessment:    1. Medicare annual wellness visit, initial  2. Need for vaccination against Streptococcus pneumoniae - Pneumococcal conjugate vaccine 13-valent IM  3. Screening for diabetes mellitus - Comprehensive metabolic panel  4. Screening for cholesterol level - Lipid panel  5. H/O malignant neoplasm of prostate: Per urology  6. Tobacco use: not contemplating cessation  7. Depression, major, recurrent, moderate ([place]): per psychiatry  8. DDD (degenerative disc disease), cervical - traMADol (ULTRAM) 50 MG tablet; Take 1 tablet (50  mg total) by mouth every 6 (six) hours as needed for severe pain.  Dispense: 28 tablet; Refill: 0  9. Chronic kidney disease (CKD), stage III (moderate): per renal - Comprehensive metabolic panel 10. HTN: continue current medication    Plan:    Further f/u pending lab work. Encouraged exercising 30 minutes daily. Phone f/u regarding tramadol efficacy and tolerance.

## 2016-10-13 ENCOUNTER — Telehealth: Payer: Self-pay

## 2016-10-13 LAB — COMPREHENSIVE METABOLIC PANEL
ALK PHOS: 142 IU/L — AB (ref 39–117)
ALT: 29 IU/L (ref 0–44)
AST: 24 IU/L (ref 0–40)
Albumin/Globulin Ratio: 1.9 (ref 1.2–2.2)
Albumin: 4.9 g/dL — ABNORMAL HIGH (ref 3.6–4.8)
BILIRUBIN TOTAL: 0.5 mg/dL (ref 0.0–1.2)
BUN/Creatinine Ratio: 14 (ref 10–24)
BUN: 21 mg/dL (ref 8–27)
CHLORIDE: 97 mmol/L (ref 96–106)
CO2: 25 mmol/L (ref 18–29)
Calcium: 9.9 mg/dL (ref 8.6–10.2)
Creatinine, Ser: 1.48 mg/dL — ABNORMAL HIGH (ref 0.76–1.27)
GFR calc Af Amer: 56 mL/min/{1.73_m2} — ABNORMAL LOW (ref >59)
GFR calc non Af Amer: 49 mL/min/{1.73_m2} — ABNORMAL LOW (ref >59)
GLUCOSE: 99 mg/dL (ref 65–99)
Globulin, Total: 2.6 g/dL (ref 1.5–4.5)
Potassium: 5.1 mmol/L (ref 3.5–5.2)
Sodium: 139 mmol/L (ref 134–144)
Total Protein: 7.5 g/dL (ref 6.0–8.5)

## 2016-10-13 LAB — LIPID PANEL
CHOL/HDL RATIO: 3.7 ratio (ref 0.0–5.0)
Cholesterol, Total: 265 mg/dL — ABNORMAL HIGH (ref 100–199)
HDL: 72 mg/dL (ref >39)
LDL Calculated: 169 mg/dL — ABNORMAL HIGH (ref 0–99)
Triglycerides: 118 mg/dL (ref 0–149)
VLDL CHOLESTEROL CAL: 24 mg/dL (ref 5–40)

## 2016-10-13 NOTE — Telephone Encounter (Signed)
-----   Message from Carmon Ginsberg, [place] sent at 10/13/2016  7:48 AM EDT ----- Your labs look ok except mildly elevated cholesterol. Due to your age, smoking, and hx of high blood pressure your calculated risk of developing cardiovascular disease is 21.5 %. This is above the 7.5% when we usually recommend a cholesterol lowering drug. Do you wish to start a cholesterol medication? Also I did not add a Hepatitis C screen to your labs. Do you want this test? (If so, see if we can add to the current blood draw)

## 2016-10-13 NOTE — Telephone Encounter (Signed)
Patient advised as directed below. Per patient he does not want Cholesterol lowering medication at this time and does not want the Hepatitis C screening.  Thanks,  -Myrle Dues

## 2016-10-28 ENCOUNTER — Encounter: Payer: Self-pay | Admitting: Psychiatry

## 2016-10-28 ENCOUNTER — Ambulatory Visit (INDEPENDENT_AMBULATORY_CARE_PROVIDER_SITE_OTHER): Payer: Medicare Other | Admitting: Psychiatry

## 2016-10-28 VITALS — BP 147/82 | HR 103 | Temp 98.3°F | Wt 221.6 lb

## 2016-10-28 DIAGNOSIS — F4001 Agoraphobia with panic disorder: Secondary | ICD-10-CM

## 2016-10-28 DIAGNOSIS — F39 Unspecified mood [affective] disorder: Secondary | ICD-10-CM

## 2016-10-28 MED ORDER — ESCITALOPRAM OXALATE 10 MG PO TABS: 10.0000 mg | ORAL_TABLET | Freq: Every day | ORAL | 1 refills | Status: DC

## 2016-10-28 NOTE — Progress Notes (Signed)
BH MD/PA/NP OP Progress Note  10/28/2016 2:02 PM Richard Bean  MRN:  [id number]  Subjective:     Patient is a 66 year old male who presented for follow-up. He was very hyper and excited as usual. He was noted to be talking fast during the interview. He reported that he has recently moved to his new home in [place] and is is still unpacking although he has moved in family. Patient reported that he is taking for her medications at this time but does not remember the names of his medications. He reported that the medications are helping him. He is not driving out of town at this time. He stated that the medications are helping him. He is not having any panic attacks. He currently denied having any suicidal homicidal ideations or plans. Discussed about decreasing the dose of Lexapro as it appears that it is making him more hyper and " manic-like". However he became anxious as he feels that he will become more anxious and he was start driving. Advised patient that he has not driven for long distance since last July and he demonstrated understanding.  He denied having any perceptual disturbances. He denied having any suicidal homicidal ideations or plans.    He currently smokes 1 pack of cigarettes on a daily basis.   Chief Complaint:  Chief Complaint    Follow-up; Medication Refill     Visit Diagnosis:     ICD-9-CM ICD-10-CM   1. Panic disorder with agoraphobia and mild panic attacks 300.21 F40.01   2. Episodic mood disorder (HCC) 296.90 F39     Past Medical History:  Past Medical History:  Diagnosis Date  . Anxiety   . Cancer ([place])   . Depression   . Panic disorder with agoraphobia     Past Surgical History:  Procedure Laterality Date  . ANKLE SURGERY Right   . PROSTATE SURGERY     Family History:  Family History  Problem Relation Age of Onset  . COPD Mother   . Prostate cancer Father   . Cancer - Lung Father   . Depression Sister   . Stomach cancer Sister   . Alcohol abuse  Brother   . Heart block Brother   . Breast cancer Sister   . Prostate cancer Brother   . Skin cancer Brother    Social History:  Social History   Social History  . Marital status: Married    Spouse name: N/A  . Number of children: N/A  . Years of education: N/A   Social History Main Topics  . Smoking status: Current Every Day Smoker    Packs/day: 1.00    Years: 30.00    Types: Cigarettes    Start date: 05/06/1971  . Smokeless tobacco: Never Used  . Alcohol use 6.6 oz/week    1 Cans of beer, 10 Shots of liquor per week  . Drug use: No  . Sexual activity: No   Other Topics Concern  . None   Social History Narrative  . None   Additional History:  Lives with his wife.  Assessment:   Musculoskeletal: Strength & Muscle Tone: within normal limits Gait & Station: normal Patient leans: N/A  Psychiatric Specialty Exam: Medication Refill  Associated symptoms include neck pain. Pertinent negatives include no abdominal pain or chills.    Review of Systems  Constitutional: Negative for chills.  HENT: Negative for hearing loss and nosebleeds.   Eyes: Negative for photophobia.  Respiratory: Negative for hemoptysis.   Cardiovascular: Negative  for palpitations.  Gastrointestinal: Negative for abdominal pain.  Genitourinary: Negative for frequency.  Musculoskeletal: Positive for back pain, joint pain and neck pain.  Neurological: Negative for sensory change.  Endo/Heme/Allergies: Negative for environmental allergies.  Psychiatric/Behavioral: Negative for hallucinations and substance abuse.  All other systems reviewed and are negative.   Blood pressure (!) 147/82, pulse (!) 103, temperature 98.3 F (36.8 C), temperature source Oral, weight 221 lb 9.6 oz (100.5 kg).Body mass index is 30.05 kg/m.  General Appearance: Casual  Eye Contact:  Fair  Speech:  Clear and Coherent  Volume:  Normal  Mood:  Anxious  Affect:  Congruent  Thought Process:  Intact  Orientation:   Full (Time, Place, and Person)  Thought Content:  WDL  Suicidal Thoughts:  No  Homicidal Thoughts:  No  Memory:  Immediate;   Fair  Judgement:  Fair  Insight:  Fair  Psychomotor Activity:  Normal  Concentration:  Fair  Recall:  [employer] of Knowledge: Fair  Language: Fair  Akathisia:  No  Handed:  Right  AIMS (if indicated):    Assets:  Communication Skills Desire for Improvement Physical Health  ADL's:  Intact  Cognition: WNL  Sleep:     Is the patient at risk to self?  No. Has the patient been a risk to self in the past 6 months?  No. Has the patient been a risk to self within the distant past?  No. Is the patient a risk to others?  No. Has the patient been a risk to others in the past 6 months?  No. Has the patient been a risk to others within the distant past?  No.  Current Medications: Current Outpatient Prescriptions  Medication Sig Dispense Refill  . amLODipine (NORVASC) 5 MG tablet Take 5 mg by mouth daily.    . enalapril (VASOTEC) 5 MG tablet Take 5 mg by mouth daily.    Marland Kitchen escitalopram (LEXAPRO) 10 MG tablet Take 1 tablet (10 mg total) by mouth daily. 90 tablet 1  . imipramine (TOFRANIL) 25 MG tablet Take 25 mg by mouth at bedtime.    . traMADol (ULTRAM) 50 MG tablet Take 1 tablet (50 mg total) by mouth every 6 (six) hours as needed for severe pain. 28 tablet 0   No current facility-administered medications for this visit.     Medical Decision Making:  Established Problem, Stable/Improving (1), Review and summation of old records (2) and Review of Last Therapy Session (1)  Treatment Plan Summary:Medication management   Anxiety symptoms I will decrease the dose of Lexapro 10 mg daily.  90 day supply of the Lexapro was filled.   Will discontinue propranolol as patient is not taking the medication at this time.   Follow-up in 3 months or earlier   More than 50% of the time spent in psychoeducation, counseling and coordination of care.     This note was  generated in part or whole with voice recognition software. Voice regonition is usually quite accurate but there are transcription errors that can and very often do occur. I apologize for any typographical errors that were not detected and corrected.   Rainey Pines, MD  10/28/2016, 2:02 PM

## 2016-11-30 DIAGNOSIS — N183 Chronic kidney disease, stage 3 (moderate): Secondary | ICD-10-CM | POA: Diagnosis not present

## 2016-11-30 DIAGNOSIS — I129 Hypertensive chronic kidney disease with stage 1 through stage 4 chronic kidney disease, or unspecified chronic kidney disease: Secondary | ICD-10-CM | POA: Diagnosis not present

## 2017-01-11 ENCOUNTER — Ambulatory Visit (INDEPENDENT_AMBULATORY_CARE_PROVIDER_SITE_OTHER): Payer: Medicare Other | Admitting: Physician Assistant

## 2017-01-11 ENCOUNTER — Encounter: Payer: Self-pay | Admitting: Physician Assistant

## 2017-01-11 VITALS — BP 138/70 | HR 84 | Temp 98.4°F | Resp 16 | Wt 220.0 lb

## 2017-01-11 DIAGNOSIS — I129 Hypertensive chronic kidney disease with stage 1 through stage 4 chronic kidney disease, or unspecified chronic kidney disease: Secondary | ICD-10-CM | POA: Diagnosis not present

## 2017-01-11 DIAGNOSIS — I491 Atrial premature depolarization: Secondary | ICD-10-CM

## 2017-01-11 DIAGNOSIS — R42 Dizziness and giddiness: Secondary | ICD-10-CM

## 2017-01-11 NOTE — Progress Notes (Addendum)
Patient: Richard Bean Male    DOB: [birth year]   66 y.o.   MRN: [id number] Visit Date: 01/11/2017  Today's Provider: Trinna Post, PA-C   Chief Complaint  Patient presents with  . Dizziness    Started about 3-4 weeks ago.    Subjective:     Richard Bean is a 66 y/o man with history of anxiety, HTN, untreated HLD, one pack per day smoking and CKD III presenting today with light headedness ongoing for 3-4 weeks.   He takes 5 mg Norvasc and 5 mg enalapril daily for his HTN, as prescribed by his nephrologist. He thinks his symptoms are due to his blood pressure. He says his blood pressure has been fluctuating. He says he does not measure his blood pressure, but only goes by readings in office. Initial reading today was 116/62. Patient says this is abnormal for him. He says the light headedness happens at no particular time. It happened every other day for while, but most recently has moved to daily. It does not happen at rest or when he's moving his head. Most recently it happened when he was hanging his curtains. He doesn't have any chest pain. Some nausea. No diaphoresis, no headache. He denies blood loss including melena, hematochezia, hematemesis. Last colonoscopy 10+ years ago, has refused new one. Has HLD, refused statin. Says this is not normal for him, something is wrong. Denies any new anxiety or depression symptoms.   Dizziness  This is a new problem. The current episode started 1 to 4 weeks ago. The problem occurs daily. The problem has been gradually worsening. Associated symptoms include nausea. Pertinent negatives include no abdominal pain, chest pain, headaches, numbness, vomiting or weakness. Nothing aggravates the symptoms. He has tried nothing for the symptoms.      Allergies  Allergen Reactions  . Chlorpheniramine-Phenylephrine      Current Outpatient Prescriptions:  .  amLODipine (NORVASC) 5 MG tablet, Take 5 mg by mouth daily., Disp: , Rfl:  .  enalapril  (VASOTEC) 5 MG tablet, Take 5 mg by mouth daily., Disp: , Rfl:  .  escitalopram (LEXAPRO) 10 MG tablet, Take 1 tablet (10 mg total) by mouth daily., Disp: 90 tablet, Rfl: 1 .  imipramine (TOFRANIL) 25 MG tablet, Take 25 mg by mouth at bedtime., Disp: , Rfl:  .  traMADol (ULTRAM) 50 MG tablet, Take 1 tablet (50 mg total) by mouth every 6 (six) hours as needed for severe pain. (Patient not taking: Reported on 01/11/2017), Disp: 28 tablet, Rfl: 0  Review of Systems  Constitutional: Negative.   HENT: Positive for tinnitus (Chronic issue). Negative for ear discharge, ear pain and nosebleeds.   Respiratory: Negative for apnea, choking, chest tightness, shortness of breath, wheezing and stridor.   Cardiovascular: Positive for palpitations. Negative for chest pain and leg swelling.  Gastrointestinal: Positive for nausea. Negative for abdominal distention, abdominal pain, anal bleeding, blood in stool, constipation, diarrhea, rectal pain and vomiting.  Musculoskeletal: Negative for gait problem.  Neurological: Positive for dizziness and light-headedness. Negative for tremors, seizures, syncope, facial asymmetry, speech difficulty, weakness, numbness and headaches.  Hematological: Negative for adenopathy. Does not bruise/bleed easily.    Social History  Substance Use Topics  . Smoking status: Current Every Day Smoker    Packs/day: 1.00    Years: 30.00    Types: Cigarettes    Start date: 05/06/1971  . Smokeless tobacco: Never Used  . Alcohol use 6.6 oz/week  1 Cans of beer, 10 Shots of liquor per week   Objective:   BP 138/70 (BP Location: Left Arm, Patient Position: Sitting, Cuff Size: Normal)   Pulse 84   Temp 98.4 F (36.9 C) (Oral)   Resp 16   Wt 220 lb (99.8 kg)   BMI 29.84 kg/m  Vitals:   01/11/17 1335  BP: 138/70  Pulse: 84  Resp: 16  Temp: 98.4 F (36.9 C)  TempSrc: Oral  Weight: 220 lb (99.8 kg)     Physical Exam  Constitutional: He is oriented to person, place, and  time. He appears well-developed and well-nourished.  Orthostatic vital signs are normal.   HENT:  Mouth/Throat: Mucous membranes are normal. Mucous membranes are not pale and not dry.  Eyes: Conjunctivae are normal. Pupils are equal, round, and reactive to light. Right eye exhibits no nystagmus. Left eye exhibits no nystagmus.  Cardiovascular: Normal rate and regular rhythm.   Pulmonary/Chest: Effort normal and breath sounds normal.  Abdominal: Soft. Bowel sounds are normal.  Musculoskeletal: He exhibits no edema or tenderness.  No tenderness in deep venous system bilateral lower extremity.  Neurological: He is alert and oriented to person, place, and time.  Skin: Skin is warm and dry. No erythema.  Psychiatric: He has a normal mood and affect. His behavior is normal.  Vitals reviewed.       Assessment & Plan:     1. Light headedness  Orthostatics normal, though patient with some symptoms standing up. Will check for anemia as below. EKG with frequent PACs, will refer to cardiology for event monitoring. Untreated HLD with 21% risk of CV event in next 10 years. Patient refuses statin. BP normotensive in office and also while patient had symptoms. Encouraged fluids, hydration. Should follow up with Mikki Santee if needed, otherwise f/u with cardiology.  - CBC With Differential - EKG 12-Lead - Ambulatory referral to Cardiology  2. Hypertension, renal disease, stage 1-4 or unspecified chronic kidney disease   3. Premature atrial contraction  - Ambulatory referral to Cardiology  Return if symptoms worsen or fail to improve.  The entirety of the information documented in the History of Present Illness, Review of Systems and Physical Exam were personally obtained by me. Portions of this information were initially documented by Ashley Royalty, CMA and reviewed by me for thoroughness and accuracy.   I have spent 25 minutes with this patient, >50% of which was spent on counseling and coordination of  care.       Trinna Post, PA-C  [facility]

## 2017-01-11 NOTE — Patient Instructions (Signed)
Dizziness Dizziness is a common problem. It is a feeling of unsteadiness or light-headedness. You may feel like you are about to faint. Dizziness can lead to injury if you stumble or fall. Anyone can become dizzy, but dizziness is more common in older adults. This condition can be caused by a number of things, including medicines, dehydration, or illness. Follow these instructions at home: Taking these steps may help with your condition: Eating and drinking   Drink enough fluid to keep your urine clear or pale yellow. This helps to keep you from becoming dehydrated. Try to drink more clear fluids, such as water.  Do not drink alcohol.  Limit your caffeine intake if directed by your health care provider.  Limit your salt intake if directed by your health care provider. Activity   Avoid making quick movements.  Rise slowly from chairs and steady yourself until you feel okay.  In the morning, first sit up on the side of the bed. When you feel okay, stand slowly while you hold onto something until you know that your balance is fine.  Move your legs often if you need to stand in one place for a long time. Tighten and relax your muscles in your legs while you are standing.  Do not drive or operate heavy machinery if you feel dizzy.  Avoid bending down if you feel dizzy. Place items in your home so that they are easy for you to reach without leaning over. Lifestyle   Do not use any tobacco products, including cigarettes, chewing tobacco, or electronic cigarettes. If you need help quitting, ask your health care provider.  Try to reduce your stress level, such as with yoga or meditation. Talk with your health care provider if you need help. General instructions   Watch your dizziness for any changes.  Take medicines only as directed by your health care provider. Talk with your health care provider if you think that your dizziness is caused by a medicine that you are taking.  Tell a friend  or a family member that you are feeling dizzy. If he or she notices any changes in your behavior, have this person call your health care provider.  Keep all follow-up visits as directed by your health care provider. This is important. Contact a health care provider if:  Your dizziness does not go away.  Your dizziness or light-headedness gets worse.  You feel nauseous.  You have reduced hearing.  You have new symptoms.  You are unsteady on your feet or you feel like the room is spinning. Get help right away if:  You vomit or have diarrhea and are unable to eat or drink anything.  You have problems talking, walking, swallowing, or using your arms, hands, or legs.  You feel generally weak.  You are not thinking clearly or you have trouble forming sentences. It may take a friend or family member to notice this.  You have chest pain, abdominal pain, shortness of breath, or sweating.  Your vision changes.  You notice any bleeding.  You have a headache.  You have neck pain or a stiff neck.  You have a fever. This information is not intended to replace advice given to you by your health care provider. Make sure you discuss any questions you have with your health care provider. Document Released: 01/06/2001 Document Revised: 12/19/2015 Document Reviewed: 07/09/2014 Elsevier Interactive Patient Education  2017 [employer]  

## 2017-01-12 LAB — CBC WITH DIFFERENTIAL
Basophils Absolute: 0.1 10*3/uL (ref 0.0–0.2)
Basos: 1 %
EOS (ABSOLUTE): 0.2 10*3/uL (ref 0.0–0.4)
Eos: 2 %
Hematocrit: 46.7 % (ref 37.5–51.0)
Hemoglobin: 16.2 g/dL (ref 13.0–17.7)
Immature Grans (Abs): 0.1 10*3/uL (ref 0.0–0.1)
Immature Granulocytes: 1 %
Lymphocytes Absolute: 1.4 10*3/uL (ref 0.7–3.1)
Lymphs: 15 %
MCH: 30.5 pg (ref 26.6–33.0)
MCHC: 34.7 g/dL (ref 31.5–35.7)
MCV: 88 fL (ref 79–97)
Monocytes Absolute: 0.7 10*3/uL (ref 0.1–0.9)
Monocytes: 8 %
Neutrophils Absolute: 6.9 10*3/uL (ref 1.4–7.0)
Neutrophils: 73 %
RBC: 5.31 x10E6/uL (ref 4.14–5.80)
RDW: 14.2 % (ref 12.3–15.4)
WBC: 9.3 10*3/uL (ref 3.4–10.8)

## 2017-01-12 NOTE — Progress Notes (Signed)
Advised  ED 

## 2017-02-01 ENCOUNTER — Ambulatory Visit: Payer: Medicare Other | Admitting: Psychiatry

## 2017-02-05 NOTE — Progress Notes (Signed)
Cardiology Office Note  Date:  02/08/2017   ID:  Richard Bean, DOB Jun 08, 1951, MRN [id number]  PCP:  Richard Ginsberg, PA   Chief Complaint  Patient presents with  . other    Ref by Dr. Jaynie Bean for lightheaded and PAC's on recent EKG. Pt. c/o being lightheaded at times and tingling in left shoulder and arm. Meds reviewed by the pt. verbally.     HPI:  Richard Bean is a 66 y/o man with history of  anxiety,  HTN,  HLD, total cholesterol 265 one pack per day smoking  CKD III  creatinine 1.5  lightheadedness ongoing for 3-4 weeks. Orthostatics normal He presents today by referral from  Richard Bean for consultation of his  APCs and dizziness  He reports having dizzy spells over the past several months Calls them lightheaded spells Typically will happen when he is standing, Last episode was approximately 3 weeks ago, since then no further episodes Had episode was a "bad spell", lasted for  2 min  he was Opening curtains.  Appreciated severe lightheadedness/dizziness, Tunnel vision, just stood there Felt his bypass out Went away on its own  Previous episodes were not as severe but were similar No documentation of orthostasis on recent visit with primary care or again today  Blood pressure remaining 150s up to 160s when supine or standing, his no significant change in heart rate  Denies any shortness of breath or chest pain on exertion  EKG personally reviewed by myself on todays visit Shows normal sinus rhythm with rate 89 bpm no significant ST or T-wave changes, rare APC   Has HLD, refused statin in the past   PMH:   has a past medical history of Anxiety; Cancer ([place]); Depression; and Panic disorder with agoraphobia.  PSH:    Past Surgical History:  Procedure Laterality Date  . ANKLE SURGERY Right   . PROSTATE SURGERY      Current Outpatient Prescriptions  Medication Sig Dispense Refill  . amLODipine (NORVASC) 5 MG tablet Take 5 mg by mouth daily.    . enalapril  (VASOTEC) 5 MG tablet Take 5 mg by mouth daily.    Marland Kitchen escitalopram (LEXAPRO) 10 MG tablet Take 1 tablet (10 mg total) by mouth daily. 90 tablet 1  . imipramine (TOFRANIL) 25 MG tablet Take 25 mg by mouth at bedtime.    . sildenafil (REVATIO) 20 MG tablet Take 20 mg by mouth as directed.     No current facility-administered medications for this visit.      Allergies:   Chlorpheniramine-phenylephrine   Social History:  The patient  reports that he has been smoking Cigarettes.  He started smoking about 45 years ago. He has a 30.00 pack-year smoking history. He has never used smokeless tobacco. He reports that he drinks about 6.6 oz of alcohol per week . He reports that he does not use drugs.   Family History:   family history includes Alcohol abuse in his brother; Breast cancer in his sister; COPD in his mother; Cancer - Lung in his father; Depression in his sister; Heart block in his brother; Prostate cancer in his brother and father; Skin cancer in his brother; Stomach cancer in his sister.    Review of Systems: Review of Systems  Constitutional: Negative.   Respiratory: Negative.   Cardiovascular: Negative.   Gastrointestinal: Negative.   Musculoskeletal: Negative.   Neurological: Positive for dizziness.       Lightheaded, near syncope  Psychiatric/Behavioral: Negative.   All  other systems reviewed and are negative.    PHYSICAL EXAM: VS:  BP (!) 150/82 (BP Location: Right Arm, Patient Position: Sitting, Cuff Size: Normal)   Pulse 89   Ht 6' (1.829 m)   Wt 219 lb (99.3 kg)   BMI 29.70 kg/m  , BMI Body mass index is 29.7 kg/m. GEN: Well nourished, well developed, in no acute distress  HEENT: normal  Neck: no JVD, carotid bruits, or masses Cardiac: RRR; no murmurs, rubs, or gallops,no edema  Respiratory:  clear to auscultation bilaterally, normal work of breathing GI: soft, nontender, nondistended, + BS MS: no deformity or atrophy  Skin: warm and dry, no rash Neuro:   Strength and sensation are intact Psych: euthymic mood, full affect    Recent Labs: 10/12/2016: ALT 29; BUN 21; Creatinine, Ser 1.48; Potassium 5.1; Sodium 139 01/11/2017: Hemoglobin 16.2    Lipid Panel Lab Results  Component Value Date   CHOL 265 (H) 10/12/2016   HDL 72 10/12/2016   LDLCALC 169 (H) 10/12/2016   TRIG 118 10/12/2016      Wt Readings from Last 3 Encounters:  02/08/17 219 lb (99.3 kg)  01/11/17 220 lb (99.8 kg)  10/12/16 223 lb 9.6 oz (101.4 kg)       ASSESSMENT AND PLAN:  Lightheaded Etiology of episodes is unclear Denies having symptoms of angina Unable to exclude arrhythmia as a cause of his symptoms Less likely orthostasis Suggested he wear a 30 day monitor given the infrequent nature of his near syncope episodes Symptoms present acutely and resolve relatively acutely concerning for arrhythmia  PAC (premature atrial contraction) Benign in nature, no further workup needed  Tobacco use We have encouraged him to continue to work on weaning his cigarettes and smoking cessation. He will continue to work on this and does not want any assistance with chantix.   Chronic kidney disease (CKD), stage III (moderate) Baseline creatinine 1.48 Recommended he avoid NSAIDs  Hypertension, renal disease, stage 1-4 or unspecified chronic kidney disease Blood pressure elevated, recommended he monitor blood pressure at home If this is continues to run high, would increase amlodipine up to 10 mg daily Could also increase enalapril up to 10 mg  Depression, major, recurrent, moderate ([place]) Managed by primary care  Hyperlipidemia Does not want a statin  Prefers the least amount of pills possible   Disposition:   F/U   as needed  We will call him with the results of his 30 day monitor  Suggested he call our office for further episodes   No orders of the defined types were placed in this encounter.    Signed, Esmond Plants, M.D., Ph.D. 02/08/2017  [name], [place]

## 2017-02-08 ENCOUNTER — Encounter: Payer: Self-pay | Admitting: Cardiovascular Disease

## 2017-02-08 ENCOUNTER — Ambulatory Visit (INDEPENDENT_AMBULATORY_CARE_PROVIDER_SITE_OTHER): Payer: Medicare Other | Admitting: Cardiovascular Disease

## 2017-02-08 VITALS — BP 150/82 | HR 89 | Ht 72.0 in | Wt 219.0 lb

## 2017-02-08 DIAGNOSIS — F331 Major depressive disorder, recurrent, moderate: Secondary | ICD-10-CM | POA: Diagnosis not present

## 2017-02-08 DIAGNOSIS — Z720 Tobacco use: Secondary | ICD-10-CM

## 2017-02-08 DIAGNOSIS — R42 Dizziness and giddiness: Secondary | ICD-10-CM

## 2017-02-08 DIAGNOSIS — E782 Mixed hyperlipidemia: Secondary | ICD-10-CM | POA: Diagnosis not present

## 2017-02-08 DIAGNOSIS — N183 Chronic kidney disease, stage 3 unspecified: Secondary | ICD-10-CM

## 2017-02-08 DIAGNOSIS — I491 Atrial premature depolarization: Secondary | ICD-10-CM

## 2017-02-08 DIAGNOSIS — I129 Hypertensive chronic kidney disease with stage 1 through stage 4 chronic kidney disease, or unspecified chronic kidney disease: Secondary | ICD-10-CM

## 2017-02-08 NOTE — Patient Instructions (Addendum)
Medication Instructions:   No medication changes made  Labwork:  No new labs needed  Testing/Procedures:  We will order a 30 day monitor for near syncope   Follow-Up: It was a pleasure seeing you in the office today. Please call us if you have new issues that need to be addressed before your next appt.  [phone]  Your physician wants you to follow-up in:  As needed  If you need a refill on your cardiac medications before your next appointment, please call your pharmacy.

## 2017-02-14 DIAGNOSIS — R42 Dizziness and giddiness: Secondary | ICD-10-CM | POA: Diagnosis not present

## 2017-02-15 ENCOUNTER — Ambulatory Visit (INDEPENDENT_AMBULATORY_CARE_PROVIDER_SITE_OTHER): Payer: Medicare Other | Admitting: Psychiatry

## 2017-02-15 ENCOUNTER — Encounter: Payer: Self-pay | Admitting: Psychiatry

## 2017-02-15 VITALS — BP 128/68 | HR 93 | Ht 72.0 in | Wt 221.0 lb

## 2017-02-15 DIAGNOSIS — F39 Unspecified mood [affective] disorder: Secondary | ICD-10-CM

## 2017-02-15 DIAGNOSIS — F4001 Agoraphobia with panic disorder: Secondary | ICD-10-CM

## 2017-02-15 MED ORDER — ESCITALOPRAM OXALATE 10 MG PO TABS: 10.0000 mg | ORAL_TABLET | Freq: Every day | ORAL | 1 refills | Status: DC

## 2017-02-15 NOTE — Progress Notes (Signed)
BH MD/PA/NP OP Progress Note  02/15/2017 2:10 PM Richard Bean  MRN:  [id number]  Subjective:     Patient is a 66 year old male who presented for follow-up. He was very hyper and excited as usual. He stated that he started having panic attack when he was driving to [place] last week. He stated that he pulled over and took his propranolol. He stated that the medication helped him and he was able to drive again in 15 minutes. He stated that he has been taking Lexapro on a regular basis. He stays at home and spends time at home. He was calm and alert during the interview. Patient reported that he does not have any adverse reactions from the medications. He is planning to go to the [facility] next week. He stated that the current combination of medications has been helpful. He currently denied having any suicidal homicidal ideations or plans.     He denied having any perceptual disturbances.  He currently smokes 1 pack of cigarettes on a daily basis.   Chief Complaint:   Visit Diagnosis:     ICD-10-CM   1. Panic disorder with agoraphobia and mild panic attacks F40.01   2. Episodic mood disorder (HCC) F39     Past Medical History:  Past Medical History:  Diagnosis Date  . Anxiety   . Cancer ([place])   . Depression   . Panic disorder with agoraphobia     Past Surgical History:  Procedure Laterality Date  . ANKLE SURGERY Right   . PROSTATE SURGERY     Family History:  Family History  Problem Relation Age of Onset  . COPD Mother   . Prostate cancer Father   . Cancer - Lung Father   . Depression Sister   . Stomach cancer Sister   . Alcohol abuse Brother   . Heart block Brother   . Breast cancer Sister   . Prostate cancer Brother   . Skin cancer Brother    Social History:  Social History   Social History  . Marital status: Married    Spouse name: N/A  . Number of children: N/A  . Years of education: N/A   Social History Main Topics  . Smoking status: Current  Every Day Smoker    Packs/day: 1.00    Years: 30.00    Types: Cigarettes    Start date: 05/06/1971  . Smokeless tobacco: Never Used  . Alcohol use 6.6 oz/week    1 Cans of beer, 10 Shots of liquor per week  . Drug use: No  . Sexual activity: No   Other Topics Concern  . Not on file   Social History Narrative  . No narrative on file   Additional History:  Lives with his wife.  Assessment:   Musculoskeletal: Strength & Muscle Tone: within normal limits Gait & Station: normal Patient leans: N/A  Psychiatric Specialty Exam: Medication Refill  Associated symptoms include neck pain. Pertinent negatives include no abdominal pain or chills.    Review of Systems  Constitutional: Negative for chills.  HENT: Negative for hearing loss and nosebleeds.   Eyes: Negative for photophobia.  Respiratory: Negative for hemoptysis.   Cardiovascular: Negative for palpitations.  Gastrointestinal: Negative for abdominal pain.  Genitourinary: Negative for frequency.  Musculoskeletal: Positive for back pain, joint pain and neck pain.  Neurological: Negative for sensory change.  Endo/Heme/Allergies: Negative for environmental allergies.  Psychiatric/Behavioral: Negative for hallucinations and substance abuse.  All other systems reviewed and are negative.  There were no vitals taken for this visit.There is no height or weight on file to calculate BMI.  General Appearance: Casual  Eye Contact:  Fair  Speech:  Clear and Coherent  Volume:  Normal  Mood:  Anxious  Affect:  Congruent  Thought Process:  Intact  Orientation:  Full (Time, Place, and Person)  Thought Content:  WDL  Suicidal Thoughts:  No  Homicidal Thoughts:  No  Memory:  Immediate;   Fair  Judgement:  Fair  Insight:  Fair  Psychomotor Activity:  Normal  Concentration:  Fair  Recall:  [employer] of Knowledge: Fair  Language: Fair  Akathisia:  No  Handed:  Right  AIMS (if indicated):    Assets:  Communication  Skills Desire for Improvement Physical Health  ADL's:  Intact  Cognition: WNL  Sleep:     Is the patient at risk to self?  No. Has the patient been a risk to self in the past 6 months?  No. Has the patient been a risk to self within the distant past?  No. Is the patient a risk to others?  No. Has the patient been a risk to others in the past 6 months?  No. Has the patient been a risk to others within the distant past?  No.  Current Medications: Current Outpatient Prescriptions  Medication Sig Dispense Refill  . amLODipine (NORVASC) 5 MG tablet Take 5 mg by mouth daily.    . enalapril (VASOTEC) 5 MG tablet Take 5 mg by mouth daily.    Marland Kitchen escitalopram (LEXAPRO) 10 MG tablet Take 1 tablet (10 mg total) by mouth daily. 90 tablet 1  . imipramine (TOFRANIL) 25 MG tablet Take 25 mg by mouth at bedtime.    . sildenafil (REVATIO) 20 MG tablet Take 20 mg by mouth as directed.     No current facility-administered medications for this visit.     Medical Decision Making:  Established Problem, Stable/Improving (1), Review and summation of old records (2) and Review of Last Therapy Session (1)  Treatment Plan Summary:Medication management   Anxiety symptoms Continue Lexapro 10 mg daily.  90 day supply of the Lexapro was filled.  Propanolol 10 mg when necessary as needed for panic attacks  Follow-up in 3 months or earlier   More than 50% of the time spent in psychoeducation, counseling and coordination of care.     This note was generated in part or whole with voice recognition software. Voice regonition is usually quite accurate but there are transcription errors that can and very often do occur. I apologize for any typographical errors that were not detected and corrected.   Rainey Pines, MD  02/15/2017, 2:10 PM

## 2017-03-26 ENCOUNTER — Encounter: Payer: Self-pay | Admitting: Family Medicine

## 2017-05-17 ENCOUNTER — Ambulatory Visit: Payer: Medicare Other | Admitting: Psychiatry

## 2017-06-07 DIAGNOSIS — I129 Hypertensive chronic kidney disease with stage 1 through stage 4 chronic kidney disease, or unspecified chronic kidney disease: Secondary | ICD-10-CM | POA: Diagnosis not present

## 2017-06-07 DIAGNOSIS — N183 Chronic kidney disease, stage 3 (moderate): Secondary | ICD-10-CM | POA: Diagnosis not present

## 2017-06-21 ENCOUNTER — Encounter: Payer: Self-pay | Admitting: Psychiatry

## 2017-06-21 ENCOUNTER — Ambulatory Visit (INDEPENDENT_AMBULATORY_CARE_PROVIDER_SITE_OTHER): Payer: Medicare Other | Admitting: Psychiatry

## 2017-06-21 DIAGNOSIS — F4001 Agoraphobia with panic disorder: Secondary | ICD-10-CM

## 2017-06-21 DIAGNOSIS — F39 Unspecified mood [affective] disorder: Secondary | ICD-10-CM | POA: Diagnosis not present

## 2017-06-21 MED ORDER — ESCITALOPRAM OXALATE 20 MG PO TABS: 20.0000 mg | ORAL_TABLET | Freq: Every day | ORAL | 2 refills | Status: DC

## 2017-06-21 NOTE — Progress Notes (Signed)
BH MD/PA/NP OP Progress Note  06/21/2017 1:26 PM Richard Bean  MRN:  [id number]  Subjective:     Patient is a 66 year old male who presented for follow-up. He stated that he continues to have anxiety which is getting worse since her dose of Lexapro was decreased to 10 mg. He wants to go back on 20 mg. He reported that he travels to [place] at least twice per week to meet with his friends in the bar over there. He reported that he drinks on Mondays and Fridays on a regular basis. He reported that he has been trying to control his drinking. He denied having any withdrawal symptoms. Patient reported that continued milligram of Lexapro was helping him. He only takes the propranolol on a when necessary basis. He denied having any suicidal ideations or plans. He appeared calm and alert during the interview. He reported that he does not have any mood symptoms. He denied having any suicidal homicidal ideations or plans. He currently lives by himself.   He denied having any perceptual disturbances.  He currently smokes 1 pack of cigarettes on a daily basis.   Chief Complaint:   Visit Diagnosis:     ICD-10-CM   1. Panic disorder with agoraphobia and mild panic attacks F40.01   2. Episodic mood disorder (HCC) F39     Past Medical History:  Past Medical History:  Diagnosis Date  . Anxiety   . Cancer ([place])   . Depression   . Panic disorder with agoraphobia     Past Surgical History:  Procedure Laterality Date  . ANKLE SURGERY Right   . PROSTATE SURGERY     Family History:  Family History  Problem Relation Age of Onset  . COPD Mother   . Prostate cancer Father   . Cancer - Lung Father   . Depression Sister   . Stomach cancer Sister   . Alcohol abuse Brother   . Heart block Brother   . Breast cancer Sister   . Prostate cancer Brother   . Skin cancer Brother    Social History:  Social History   Socioeconomic History  . Marital status: Married    Spouse name: Not on file  .  Number of children: Not on file  . Years of education: Not on file  . Highest education level: Not on file  Social Needs  . Financial resource strain: Not on file  . Food insecurity - worry: Not on file  . Food insecurity - inability: Not on file  . Transportation needs - medical: Not on file  . Transportation needs - non-medical: Not on file  Occupational History  . Not on file  Tobacco Use  . Smoking status: Current Every Day Smoker    Packs/day: 1.00    Years: 30.00    Pack years: 30.00    Types: Cigarettes    Start date: 05/06/1971  . Smokeless tobacco: Never Used  Substance and Sexual Activity  . Alcohol use: Yes    Alcohol/week: 9.6 oz    Types: 10 Shots of liquor, 6 Cans of beer per week  . Drug use: No  . Sexual activity: No  Other Topics Concern  . Not on file  Social History Narrative  . Not on file   Additional History:  Lives with his wife.  Assessment:   Musculoskeletal: Strength & Muscle Tone: within normal limits Gait & Station: normal Patient leans: N/A  Psychiatric Specialty Exam: Medication Refill  Associated symptoms include neck pain.  Pertinent negatives include no abdominal pain or chills.    Review of Systems  Constitutional: Negative for chills.  HENT: Negative for hearing loss and nosebleeds.   Eyes: Negative for photophobia.  Respiratory: Negative for hemoptysis.   Cardiovascular: Negative for palpitations.  Gastrointestinal: Negative for abdominal pain.  Genitourinary: Negative for frequency.  Musculoskeletal: Positive for back pain, joint pain and neck pain.  Neurological: Negative for sensory change.  Endo/Heme/Allergies: Negative for environmental allergies.  Psychiatric/Behavioral: Negative for hallucinations and substance abuse.  All other systems reviewed and are negative.   There were no vitals taken for this visit.There is no height or weight on file to calculate BMI.  General Appearance: Casual  Eye Contact:  Fair   Speech:  Clear and Coherent  Volume:  Normal  Mood:  Anxious  Affect:  Congruent  Thought Process:  Intact  Orientation:  Full (Time, Place, and Person)  Thought Content:  WDL  Suicidal Thoughts:  No  Homicidal Thoughts:  No  Memory:  Immediate;   Fair  Judgement:  Fair  Insight:  Fair  Psychomotor Activity:  Normal  Concentration:  Fair  Recall:  [employer] of Knowledge: Fair  Language: Fair  Akathisia:  No  Handed:  Right  AIMS (if indicated):    Assets:  Communication Skills Desire for Improvement Physical Health  ADL's:  Intact  Cognition: WNL  Sleep:     Is the patient at risk to self?  No. Has the patient been a risk to self in the past 6 months?  No. Has the patient been a risk to self within the distant past?  No. Is the patient a risk to others?  No. Has the patient been a risk to others in the past 6 months?  No. Has the patient been a risk to others within the distant past?  No.  Current Medications: Current Outpatient Medications  Medication Sig Dispense Refill  . amLODipine (NORVASC) 5 MG tablet Take 5 mg by mouth daily.    . enalapril (VASOTEC) 5 MG tablet Take 5 mg by mouth daily.    Marland Kitchen escitalopram (LEXAPRO) 20 MG tablet Take 1 tablet (20 mg total) by mouth daily. 90 tablet 2  . imipramine (TOFRANIL) 25 MG tablet Take 25 mg by mouth at bedtime.    . sildenafil (REVATIO) 20 MG tablet Take 20 mg by mouth as directed.     No current facility-administered medications for this visit.     Medical Decision Making:  Established Problem, Stable/Improving (1), Review and summation of old records (2) and Review of Last Therapy Session (1)  Treatment Plan Summary:Medication management   Anxiety symptoms Increase Lexapro 20 mg daily.  90 day supply of the Lexapro was filled.  Propanolol 10 mg when necessary as needed for panic attacks-has supply  Follow-up in 3 months or earlier   More than 50% of the time spent in psychoeducation, counseling and  coordination of care.     This note was generated in part or whole with voice recognition software. Voice regonition is usually quite accurate but there are transcription errors that can and very often do occur. I apologize for any typographical errors that were not detected and corrected.   Rainey Pines, MD  06/21/2017, 1:26 PM

## 2017-08-12 ENCOUNTER — Telehealth: Payer: Self-pay

## 2017-08-12 DIAGNOSIS — Z1211 Encounter for screening for malignant neoplasm of colon: Secondary | ICD-10-CM

## 2017-08-12 NOTE — Telephone Encounter (Signed)
Advised patient that screening colonoscopy is due , patient agreed. KW

## 2017-08-12 NOTE — Telephone Encounter (Signed)
Pt returned call. Thanks TNP °

## 2017-08-12 NOTE — Telephone Encounter (Signed)
Left message for patient to call office back. Patient is due for colonoscopy screening, order has been placed in chart for patient to have screening done.

## 2017-09-15 DIAGNOSIS — Z1211 Encounter for screening for malignant neoplasm of colon: Secondary | ICD-10-CM | POA: Diagnosis not present

## 2017-09-15 DIAGNOSIS — K219 Gastro-esophageal reflux disease without esophagitis: Secondary | ICD-10-CM | POA: Diagnosis not present

## 2017-09-20 ENCOUNTER — Encounter: Payer: Self-pay | Admitting: Psychiatry

## 2017-09-20 ENCOUNTER — Other Ambulatory Visit: Payer: Self-pay

## 2017-09-20 ENCOUNTER — Ambulatory Visit (INDEPENDENT_AMBULATORY_CARE_PROVIDER_SITE_OTHER): Payer: Medicare Other | Admitting: Psychiatry

## 2017-09-20 VITALS — BP 160/78 | HR 99 | Temp 98.3°F | Wt 225.6 lb

## 2017-09-20 DIAGNOSIS — F4001 Agoraphobia with panic disorder: Secondary | ICD-10-CM

## 2017-09-20 DIAGNOSIS — F39 Unspecified mood [affective] disorder: Secondary | ICD-10-CM

## 2017-09-20 MED ORDER — ESCITALOPRAM OXALATE 20 MG PO TABS: 20.0000 mg | ORAL_TABLET | Freq: Every day | ORAL | 2 refills | Status: DC

## 2017-09-20 NOTE — Progress Notes (Signed)
BH MD/PA/NP OP Progress Note  09/20/2017 2:29 PM Richard Bean  MRN:  [id number]  Subjective:     Patient is a 67 year old male who presented for follow-up. He stated that he has been doing well and has been taking his Lexapro 20 mg on a daily basis. Patient reported that it is much better and he is not having any anxiety. He is compliant with his medications. He reported that he takes propanolol on a when necessary basis. He drinks alcohol with his friends and he is going out to play pool 2-3 times per week. He is minimizing his use of alcohol at this time. He does not have any withdrawal symptoms. He denied having any suicidal homicidal ideations or plans. He denied having any perceptual disturbances. He appears pleasant and cooperative during the interview.   He currently smokes 1 pack of cigarettes on a daily basis.   Chief Complaint:  Chief Complaint    Follow-up; Medication Refill     Visit Diagnosis:     ICD-10-CM   1. Panic disorder with agoraphobia and mild panic attacks F40.01   2. Episodic mood disorder (HCC) F39     Past Medical History:  Past Medical History:  Diagnosis Date  . Anxiety   . Cancer ([place])   . Depression   . Panic disorder with agoraphobia     Past Surgical History:  Procedure Laterality Date  . ANKLE SURGERY Right   . PROSTATE SURGERY     Family History:  Family History  Problem Relation Age of Onset  . COPD Mother   . Prostate cancer Father   . Cancer - Lung Father   . Depression Sister   . Stomach cancer Sister   . Alcohol abuse Brother   . Heart block Brother   . Breast cancer Sister   . Prostate cancer Brother   . Skin cancer Brother    Social History:  Social History   Socioeconomic History  . Marital status: Married    Spouse name: None  . Number of children: None  . Years of education: None  . Highest education level: None  Social Needs  . Financial resource strain: None  . Food insecurity - worry: None  . Food insecurity  - inability: None  . Transportation needs - medical: None  . Transportation needs - non-medical: None  Occupational History  . None  Tobacco Use  . Smoking status: Current Every Day Smoker    Packs/day: 1.00    Years: 30.00    Pack years: 30.00    Types: Cigarettes    Start date: 05/06/1971  . Smokeless tobacco: Never Used  Substance and Sexual Activity  . Alcohol use: Yes    Alcohol/week: 9.6 oz    Types: 10 Shots of liquor, 6 Cans of beer per week  . Drug use: No  . Sexual activity: No  Other Topics Concern  . None  Social History Narrative  . None   Additional History:  Lives with his wife.  Assessment:   Musculoskeletal: Strength & Muscle Tone: within normal limits Gait & Station: normal Patient leans: N/A  Psychiatric Specialty Exam: Medication Refill  Associated symptoms include neck pain. Pertinent negatives include no abdominal pain or chills.    Review of Systems  Constitutional: Negative for chills.  HENT: Negative for hearing loss and nosebleeds.   Eyes: Negative for photophobia.  Respiratory: Negative for hemoptysis.   Cardiovascular: Negative for palpitations.  Gastrointestinal: Negative for abdominal pain.  Genitourinary: Negative  for frequency.  Musculoskeletal: Positive for back pain, joint pain and neck pain.  Neurological: Negative for sensory change.  Endo/Heme/Allergies: Negative for environmental allergies.  Psychiatric/Behavioral: Negative for hallucinations and substance abuse.  All other systems reviewed and are negative.   Blood pressure (!) 160/78, pulse 99, temperature 98.3 F (36.8 C), temperature source Oral, weight 225 lb 9.6 oz (102.3 kg).Body mass index is 30.6 kg/m.  General Appearance: Casual  Eye Contact:  Fair  Speech:  Clear and Coherent  Volume:  Normal  Mood:  Anxious  Affect:  Congruent  Thought Process:  Intact  Orientation:  Full (Time, Place, and Person)  Thought Content:  WDL  Suicidal Thoughts:  No   Homicidal Thoughts:  No  Memory:  Immediate;   Fair  Judgement:  Fair  Insight:  Fair  Psychomotor Activity:  Normal  Concentration:  Fair  Recall:  [employer] of Knowledge: Fair  Language: Fair  Akathisia:  No  Handed:  Right  AIMS (if indicated):    Assets:  Communication Skills Desire for Improvement Physical Health  ADL's:  Intact  Cognition: WNL  Sleep:     Is the patient at risk to self?  No. Has the patient been a risk to self in the past 6 months?  No. Has the patient been a risk to self within the distant past?  No. Is the patient a risk to others?  No. Has the patient been a risk to others in the past 6 months?  No. Has the patient been a risk to others within the distant past?  No.  Current Medications: Current Outpatient Medications  Medication Sig Dispense Refill  . amLODipine (NORVASC) 5 MG tablet Take 5 mg by mouth daily.    . enalapril (VASOTEC) 5 MG tablet Take 5 mg by mouth daily.    Marland Kitchen escitalopram (LEXAPRO) 20 MG tablet Take 1 tablet (20 mg total) by mouth daily. 90 tablet 2  . imipramine (TOFRANIL) 25 MG tablet Take 25 mg by mouth at bedtime.    . sildenafil (REVATIO) 20 MG tablet Take 20 mg by mouth as directed.     No current facility-administered medications for this visit.     Medical Decision Making:  Established Problem, Stable/Improving (1), Review and summation of old records (2) and Review of Last Therapy Session (1)  Treatment Plan Summary:Medication management   Anxiety symptoms  Lexapro 20 mg daily.  90 day supply of the Lexapro was filled.  Propanolol 10 mg when necessary as needed for panic attacks-has supply  Follow-up in 3 months or earlier   More than 50% of the time spent in psychoeducation, counseling and coordination of care.     This note was generated in part or whole with voice recognition software. Voice regonition is usually quite accurate but there are transcription errors that can and very often do occur. I  apologize for any typographical errors that were not detected and corrected.   Rainey Pines, MD  09/20/2017, 2:29 PM

## 2017-10-22 DIAGNOSIS — K635 Polyp of colon: Secondary | ICD-10-CM | POA: Diagnosis not present

## 2017-10-22 DIAGNOSIS — D126 Benign neoplasm of colon, unspecified: Secondary | ICD-10-CM | POA: Diagnosis not present

## 2017-10-22 DIAGNOSIS — Z1211 Encounter for screening for malignant neoplasm of colon: Secondary | ICD-10-CM | POA: Diagnosis not present

## 2017-10-22 DIAGNOSIS — I10 Essential (primary) hypertension: Secondary | ICD-10-CM | POA: Diagnosis not present

## 2017-10-22 DIAGNOSIS — D123 Benign neoplasm of transverse colon: Secondary | ICD-10-CM | POA: Diagnosis not present

## 2017-10-22 DIAGNOSIS — K620 Anal polyp: Secondary | ICD-10-CM | POA: Diagnosis not present

## 2017-10-22 DIAGNOSIS — F418 Other specified anxiety disorders: Secondary | ICD-10-CM | POA: Diagnosis not present

## 2017-10-22 DIAGNOSIS — K621 Rectal polyp: Secondary | ICD-10-CM | POA: Diagnosis not present

## 2017-10-22 LAB — HM COLONOSCOPY

## 2017-12-13 DIAGNOSIS — J129 Viral pneumonia, unspecified: Secondary | ICD-10-CM | POA: Diagnosis not present

## 2017-12-13 DIAGNOSIS — I129 Hypertensive chronic kidney disease with stage 1 through stage 4 chronic kidney disease, or unspecified chronic kidney disease: Secondary | ICD-10-CM | POA: Diagnosis not present

## 2017-12-13 DIAGNOSIS — Z720 Tobacco use: Secondary | ICD-10-CM | POA: Diagnosis not present

## 2017-12-13 DIAGNOSIS — N183 Chronic kidney disease, stage 3 (moderate): Secondary | ICD-10-CM | POA: Diagnosis not present

## 2018-03-07 ENCOUNTER — Encounter: Payer: Self-pay | Admitting: Family Medicine

## 2018-03-07 ENCOUNTER — Ambulatory Visit (INDEPENDENT_AMBULATORY_CARE_PROVIDER_SITE_OTHER): Payer: Medicare Other | Admitting: Family Medicine

## 2018-03-07 VITALS — BP 120/70 | HR 68 | Temp 98.7°F | Resp 15 | Ht 72.0 in | Wt 220.0 lb

## 2018-03-07 DIAGNOSIS — R05 Cough: Secondary | ICD-10-CM | POA: Diagnosis not present

## 2018-03-07 DIAGNOSIS — N183 Chronic kidney disease, stage 3 unspecified: Secondary | ICD-10-CM

## 2018-03-07 DIAGNOSIS — M5030 Other cervical disc degeneration, unspecified cervical region: Secondary | ICD-10-CM | POA: Diagnosis not present

## 2018-03-07 DIAGNOSIS — E782 Mixed hyperlipidemia: Secondary | ICD-10-CM | POA: Diagnosis not present

## 2018-03-07 DIAGNOSIS — I129 Hypertensive chronic kidney disease with stage 1 through stage 4 chronic kidney disease, or unspecified chronic kidney disease: Secondary | ICD-10-CM | POA: Diagnosis not present

## 2018-03-07 DIAGNOSIS — F331 Major depressive disorder, recurrent, moderate: Secondary | ICD-10-CM

## 2018-03-07 DIAGNOSIS — L989 Disorder of the skin and subcutaneous tissue, unspecified: Secondary | ICD-10-CM | POA: Diagnosis not present

## 2018-03-07 DIAGNOSIS — R053 Chronic cough: Secondary | ICD-10-CM

## 2018-03-07 DIAGNOSIS — Z122 Encounter for screening for malignant neoplasm of respiratory organs: Secondary | ICD-10-CM | POA: Diagnosis not present

## 2018-03-07 DIAGNOSIS — Z8546 Personal history of malignant neoplasm of prostate: Secondary | ICD-10-CM | POA: Diagnosis not present

## 2018-03-07 DIAGNOSIS — Z0000 Encounter for general adult medical examination without abnormal findings: Secondary | ICD-10-CM | POA: Diagnosis not present

## 2018-03-07 MED ORDER — IMIPRAMINE HCL 25 MG PO TABS: 25.0000 mg | ORAL_TABLET | Freq: Every day | ORAL | 3 refills | Status: DC

## 2018-03-07 NOTE — Progress Notes (Signed)
  Subjective:     Patient ID: Richard Bean, male   DOB: 1951-03-17, 67 y.o.   MRN: [id number] Chief Complaint  Patient presents with  . Annual Exam    Patient comes in office today for his annual physical, he states that he feels well today but would like to address bowel issues he has had in the past 30days. Patient reports that he follows a well balanced diet but is not actively exercising. Patient sleeps on average 5hrs a night and states that his libido is absent   HPI Continues to be followed by psychiatry, Dr. Gretel Acre; Renal, Dr.Kolluru, and urology, Dr. Jacqlyn Larsen who has moved out of state. Wishes referral to Dr. Bernardo Heater. Continues to smoke 1 ppd but is contemplating quitting. He does drink regularly but does not consider himself a heavy drinker.  Review of Systems General: Feeling well. Will update Pneumovax and tetanus if needed at time of flu shot in September HEENT: regular dental visits and eye exams Cardiovascular: no chest pain, shortness of breath, or palpitations Respiratory: reports chronic smoker's cough. Wishes screening for lung cancer GI: no heartburn. States after he ran out of imipramine in June he has had more bowel frequency but no diarrhea. Reports feeling of incomplete emptying but no frank incontinence. GU: Increased urinary frequency after running out of imipramine. Followed by urology. Psychiatric: not depressed. Continues to be followed by psychiatry Musculoskeletal: no joint pain    Objective:   Physical Exam  Constitutional: He appears well-developed and well-nourished. No distress.  Eyes: PERRLA, EOMI Neck: no thyromegaly, tenderness or nodules, no cervical adenopathy or carotid bruits. ENT: TM's intact without inflammation; No tonsillar enlargement or exudate, Lungs: Clear Heart : RRR without murmur or gallop Abd: bowel sounds present, soft, non-tender, no organomegaly Extremities: no edema,  Skin: Right upper arm with ? Cutaneous horn. Cryotherapy for 45  seconds.     Assessment:    1. Annual physical exam  2. Chronic kidney disease (CKD), stage III (moderate) ([place]): Per renal  3. DDD (degenerative disc disease), cervical: magnetic bracelet helps and has reduced his analgesic use.  4. Depression, major, recurrent, moderate ([place]): per psychiatry  5. Hypertension, renal disease, stage 1-4 or unspecified chronic kidney disease: per renal - Comprehensive metabolic panel  6. H/O malignant neoplasm of prostate - PSA - Ambulatory referral to Urology  7. Mixed hyperlipidemia - Lipid panel  8. Skin lesion of right arm - Cryotherapy/destruction benign or premalignant lesion  9. Encounter for screening for malignant neoplasm of respiratory organs - CT CHEST LUNG CA SCREEN LOW DOSE W/O CM; Future  10. Chronic cough - CT CHEST LUNG CA SCREEN LOW DOSE W/O CM; Future    Plan:    Further f/u pending lab work. Update immunizations at flu clinic next month.

## 2018-03-07 NOTE — Patient Instructions (Signed)
We will call you with the lab results and referral to urology.

## 2018-03-11 ENCOUNTER — Other Ambulatory Visit: Payer: Self-pay | Admitting: *Deleted

## 2018-03-11 ENCOUNTER — Telehealth: Payer: Self-pay | Admitting: *Deleted

## 2018-03-11 NOTE — Progress Notes (Signed)
Attempted to cancel order, unable CT notified

## 2018-03-11 NOTE — Telephone Encounter (Signed)
Received referral for low dose lung cancer screening CT scan made incorrectly.  Provider notified.  Will get patient into CT screening through the process here at the cancer center, appt that was scheduled is cancelled at this time.  Left message for patient to NOT got to the appt previously scheduled and to call us here.   will attempt call at later point.

## 2018-03-14 ENCOUNTER — Ambulatory Visit: Payer: Medicare Other | Admitting: Psychiatry

## 2018-03-14 ENCOUNTER — Ambulatory Visit: Admission: RE | Admit: 2018-03-14 | Payer: Medicare Other | Source: Ambulatory Visit

## 2018-03-14 ENCOUNTER — Ambulatory Visit: Payer: Medicare Other

## 2018-03-15 ENCOUNTER — Telehealth: Payer: Self-pay | Admitting: *Deleted

## 2018-03-15 NOTE — Telephone Encounter (Signed)
Attempted to contact patient r/t LDCT Screening follow up due at this time.  No answer received, message left for patient to call [phone] to schedule appointment.

## 2018-03-17 ENCOUNTER — Encounter: Payer: Self-pay | Admitting: *Deleted

## 2018-03-17 ENCOUNTER — Telehealth: Payer: Self-pay | Admitting: *Deleted

## 2018-03-17 NOTE — Telephone Encounter (Signed)
Received referral for low dose lung cancer screening CT scan.  Message left at phone number listed in EMR for patient to call either myself or Shawn Perkins back at [phone] to facilitate scheduling the scan.    

## 2018-03-21 ENCOUNTER — Ambulatory Visit: Payer: Medicare Other | Admitting: Psychiatry

## 2018-03-22 ENCOUNTER — Encounter: Payer: Self-pay | Admitting: *Deleted

## 2018-03-22 ENCOUNTER — Telehealth: Payer: Self-pay | Admitting: *Deleted

## 2018-03-22 DIAGNOSIS — Z122 Encounter for screening for malignant neoplasm of respiratory organs: Secondary | ICD-10-CM

## 2018-03-22 NOTE — Telephone Encounter (Signed)
Received a referral for initial lung cancer screening scan.  Contacted the patient and obtained their smoking history, 70.5 pkyr history, current smoker trying to decrease to 0.25ppd  as well as answering questions related to screening process.  Patient denies signs of lung cancer such as weight loss or hemoptysis at this time.  Patient denies comorbidity that would prevent curative treatment if lung cancer were found.  Patient is scheduled for the Shared Decision Making Visit and CT scan on 03-29-18 @1345 .

## 2018-03-25 ENCOUNTER — Telehealth: Payer: Self-pay | Admitting: *Deleted

## 2018-03-25 NOTE — Telephone Encounter (Signed)
Called patient to remind him of his LDCT screening on 03-29-18 at 1345, unable to reach patient message left for patient and instructed to call Tuesday am with questions or cancellation to [phone]

## 2018-03-29 ENCOUNTER — Inpatient Hospital Stay: Payer: Medicare Other | Attending: Oncology | Admitting: Oncology

## 2018-03-29 ENCOUNTER — Ambulatory Visit
Admission: RE | Admit: 2018-03-29 | Discharge: 2018-03-29 | Disposition: A | Discharge status: Home / Self Care | Payer: Medicare Other | Source: Ambulatory Visit | Attending: Oncology | Admitting: Oncology

## 2018-03-29 DIAGNOSIS — J438 Other emphysema: Secondary | ICD-10-CM | POA: Diagnosis not present

## 2018-03-29 DIAGNOSIS — I2510 Atherosclerotic heart disease of native coronary artery without angina pectoris: Secondary | ICD-10-CM | POA: Insufficient documentation

## 2018-03-29 DIAGNOSIS — N200 Calculus of kidney: Secondary | ICD-10-CM | POA: Diagnosis not present

## 2018-03-29 DIAGNOSIS — Z87891 Personal history of nicotine dependence: Secondary | ICD-10-CM

## 2018-03-29 DIAGNOSIS — F17210 Nicotine dependence, cigarettes, uncomplicated: Secondary | ICD-10-CM | POA: Diagnosis not present

## 2018-03-29 DIAGNOSIS — Z122 Encounter for screening for malignant neoplasm of respiratory organs: Secondary | ICD-10-CM | POA: Insufficient documentation

## 2018-03-29 DIAGNOSIS — J432 Centrilobular emphysema: Secondary | ICD-10-CM | POA: Insufficient documentation

## 2018-03-29 DIAGNOSIS — I700 Atherosclerosis of aorta: Secondary | ICD-10-CM | POA: Insufficient documentation

## 2018-03-29 NOTE — Progress Notes (Signed)
In accordance with CMS guidelines, patient has met eligibility criteria including age, absence of signs or symptoms of lung cancer.  Social History   Tobacco Use  . Smoking status: Current Every Day Smoker    Packs/day: 1.50    Years: 47.00    Pack years: 70.50    Types: Cigarettes    Start date: 05/06/1971  . Smokeless tobacco: Never Used  Substance Use Topics  . Alcohol use: Yes    Alcohol/week: 16.0 standard drinks    Types: 6 Cans of beer, 10 Shots of liquor per week  . Drug use: No     A shared decision-making session was conducted prior to the performance of CT scan. This includes one or more decision aids, includes benefits and harms of screening, follow-up diagnostic testing, over-diagnosis, false positive rate, and total radiation exposure.  Counseling on the importance of adherence to annual lung cancer LDCT screening, impact of co-morbidities, and ability or willingness to undergo diagnosis and treatment is imperative for compliance of the program.  Counseling on the importance of continued smoking cessation for former smokers; the importance of smoking cessation for current smokers, and information about tobacco cessation interventions have been given to patient including [place] and [facility] quit [place] programs.  Written order for lung cancer screening with LDCT has been given to the patient and any and all questions have been answered to the best of my abilities.   Yearly follow up will be coordinated by Burgess Estelle, Thoracic Navigator.  Faythe Casa, NP 03/29/2018 2:46 PM

## 2018-04-01 ENCOUNTER — Encounter: Payer: Self-pay | Admitting: *Deleted

## 2018-04-04 ENCOUNTER — Ambulatory Visit (INDEPENDENT_AMBULATORY_CARE_PROVIDER_SITE_OTHER): Payer: Medicare Other | Admitting: Psychiatry

## 2018-04-04 ENCOUNTER — Encounter: Payer: Self-pay | Admitting: Psychiatry

## 2018-04-04 ENCOUNTER — Other Ambulatory Visit: Payer: Self-pay

## 2018-04-04 VITALS — BP 132/70 | HR 93 | Temp 98.8°F | Wt 219.2 lb

## 2018-04-04 DIAGNOSIS — F39 Unspecified mood [affective] disorder: Secondary | ICD-10-CM | POA: Diagnosis not present

## 2018-04-04 DIAGNOSIS — F4001 Agoraphobia with panic disorder: Secondary | ICD-10-CM | POA: Diagnosis not present

## 2018-04-04 MED ORDER — ESCITALOPRAM OXALATE 20 MG PO TABS: 20.0000 mg | ORAL_TABLET | Freq: Every day | ORAL | 2 refills | Status: DC

## 2018-04-04 NOTE — Progress Notes (Signed)
BH MD/PA/NP OP Progress Note  04/04/2018 3:06 PM Richard Bean  MRN:  [id number]  Subjective:     Patient is a 67 year old male who presented for follow-up. He stated that he has been doing well and has been taking his Lexapro 20 mg on a daily basis. Patient reported that his family.  He enjoyed the trip.  He reported that he does not have any side effects of the medications at this time.  As needed basis and he still has enough supply of the medication.  He stated that his blood pressure is under control.  He appears calm and cooperative during the interview.  No acute issues noted at this time.  He denies having any withdrawal symptoms and continues to minimize his use of alcohol at this time..   . He appears pleasant and cooperative during the interview.   He currently smokes 1 pack of cigarettes on a daily basis.   Chief Complaint:  Chief Complaint    Follow-up; Medication Refill     Visit Diagnosis:     ICD-10-CM   1. Panic disorder with agoraphobia and mild panic attacks F40.01   2. Episodic mood disorder (HCC) F39     Past Medical History:  Past Medical History:  Diagnosis Date  . Anxiety   . [place] ([place]   . Depression   . Panic disorder with agoraphobia     Past Surgical History:  Procedure Laterality Date  . ANKLE SURGERY Right   . PROSTATE SURGERY     Family History:  Family History  Problem Relation Age of Onset  . COPD Mother   . Prostate cancer Father   . Cancer - Lung Father   . Depression Sister   . Stomach cancer Sister   . Alcohol abuse Brother   . Heart block Brother   . Breast cancer Sister   . Prostate cancer Brother   . Skin cancer Brother    Social History:  Social History   Socioeconomic History  . Marital status: Married    Spouse name: Not on file  . Number of children: Not on file  . Years of education: Not on file  . Highest education level: Not on file  Occupational History  . Not on file  Social Needs  . Financial resource  strain: Not hard at all  . Food insecurity:    Worry: Never true    Inability: Never true  . Transportation needs:    Medical: No    Non-medical: No  Tobacco Use  . Smoking status: Current Every Day Smoker    Packs/day: 1.50    Years: 47.00    Pack years: 70.50    Types: Cigarettes    Start date: 05/06/1971  . Smokeless tobacco: Never Used  Substance and Sexual Activity  . Alcohol use: Yes    Alcohol/week: 16.0 standard drinks    Types: 6 Cans of beer, 10 Shots of liquor per week  . Drug use: No  . Sexual activity: Never  Lifestyle  . Physical activity:    Days per week: 0 days    Minutes per session: 0 min  . Stress: Not on file  Relationships  . Social connections:    Talks on phone: Not on file    Gets together: Not on file    Attends religious service: Not on file    Active member of club or organization: Not on file    Attends meetings of clubs or organizations: Not on file  Relationship status: Not on file  Other Topics Concern  . Not on file  Social History Narrative  . Not on file   Additional History:  Lives with his wife.  Assessment:   Musculoskeletal: Strength & Muscle Tone: within normal limits Gait & Station: normal Patient leans: N/A  Psychiatric Specialty Exam: Medication Refill  Associated symptoms include neck pain. Pertinent negatives include no abdominal pain or chills.    Review of Systems  Constitutional: Negative for chills.  HENT: Negative for hearing loss and nosebleeds.   Eyes: Negative for photophobia.  Respiratory: Negative for hemoptysis.   Cardiovascular: Negative for palpitations.  Gastrointestinal: Negative for abdominal pain.  Genitourinary: Negative for frequency.  Musculoskeletal: Positive for back pain, joint pain and neck pain.  Neurological: Negative for sensory change.  Endo/Heme/Allergies: Negative for environmental allergies.  Psychiatric/Behavioral: Negative for hallucinations and substance abuse.  All other  systems reviewed and are negative.   Blood pressure 132/70, pulse 93, temperature 98.8 F (37.1 C), temperature source Oral, weight 219 lb 3.2 oz (99.4 kg).Body mass index is 29.73 kg/m.  General Appearance: Casual  Eye Contact:  Fair  Speech:  Clear and Coherent  Volume:  Normal  Mood:  Anxious  Affect:  Congruent  Thought Process:  Intact  Orientation:  Full (Time, Place, and Person)  Thought Content:  WDL  Suicidal Thoughts:  No  Homicidal Thoughts:  No  Memory:  Immediate;   Fair  Judgement:  Fair  Insight:  Fair  Psychomotor Activity:  Normal  Concentration:  Fair  Recall:  [employer] of Knowledge: Fair  Language: Fair  Akathisia:  No  Handed:  Right  AIMS (if indicated):    Assets:  Communication Skills Desire for Improvement Physical Health  ADL's:  Intact  Cognition: WNL  Sleep:     Is the patient at risk to self?  No. Has the patient been a risk to self in the past 6 months?  No. Has the patient been a risk to self within the distant past?  No. Is the patient a risk to others?  No. Has the patient been a risk to others in the past 6 months?  No. Has the patient been a risk to others within the distant past?  No.  Current Medications: Current Outpatient Medications  Medication Sig Dispense Refill  . amLODipine (NORVASC) 5 MG tablet Take 5 mg by mouth daily.    . enalapril (VASOTEC) 5 MG tablet Take 5 mg by mouth daily.    Marland Kitchen escitalopram (LEXAPRO) 20 MG tablet Take 1 tablet (20 mg total) by mouth daily. 90 tablet 2  . imipramine (TOFRANIL) 25 MG tablet Take 1 tablet (25 mg total) by mouth at bedtime. 90 tablet 3  . sildenafil (REVATIO) 20 MG tablet Take 20 mg by mouth as directed.     No current facility-administered medications for this visit.     Medical Decision Making:  Established Problem, Stable/Improving (1), Review and summation of old records (2) and Review of Last Therapy Session (1)  Treatment Plan Summary:Medication management   Anxiety  symptoms  Lexapro 20 mg daily.  90 day supply of the Lexapro was filled.  Propanolol 10 mg when necessary as needed for panic attacks-has supply  Follow-up in 3 months or earlier   More than 50% of the time spent in psychoeducation, counseling and coordination of care.     This note was generated in part or whole with voice recognition software. Voice regonition is usually quite  accurate but there are transcription errors that can and very often do occur. I apologize for any typographical errors that were not detected and corrected.   Rainey Pines, MD  04/04/2018, 3:06 PM

## 2018-05-05 ENCOUNTER — Ambulatory Visit (INDEPENDENT_AMBULATORY_CARE_PROVIDER_SITE_OTHER): Payer: Medicare Other | Admitting: Family Medicine

## 2018-05-05 ENCOUNTER — Encounter: Payer: Self-pay | Admitting: Family Medicine

## 2018-05-05 VITALS — BP 126/80 | HR 89 | Temp 98.8°F | Resp 16 | Wt 223.0 lb

## 2018-05-05 DIAGNOSIS — Z23 Encounter for immunization: Secondary | ICD-10-CM

## 2018-05-05 DIAGNOSIS — F17210 Nicotine dependence, cigarettes, uncomplicated: Secondary | ICD-10-CM | POA: Diagnosis not present

## 2018-05-05 MED ORDER — BUPROPION HCL ER (SR) 150 MG PO TB12: 150.0000 mg | ORAL_TABLET | Freq: Two times a day (BID) | ORAL | 0 refills | Status: DC

## 2018-05-05 NOTE — Patient Instructions (Signed)
Do add nicotine products and hypnosis. Let me know how you are doing before you run out of medication.

## 2018-05-05 NOTE — Addendum Note (Signed)
Addended by: Minette Headland on: 05/05/2018 03:45 PM   Modules accepted: Orders

## 2018-05-05 NOTE — Progress Notes (Signed)
  Subjective:     Patient ID: Richard Bean, male   DOB: 1951/04/13, 67 y.o.   MRN: [id number] Chief Complaint  Patient presents with  . Nicotine Dependence    Patient comes in office today to address quitting smoking. Patient states that he started smoking at the age of 67 and smokes 2 packs a day. Patient reports that he tried quitting "cold [place]" once before and got down to less than half a pack a day but than slowly increased back up. Today patient would like to discuss medication options.    HPI States he tried a cigarette taper but was not successful. Does not wish to try Chantix due to dream side effect. He has also signed up for hypnosis. He decided to quit smoking when recent lung CT showed evidence of emphysema.   Review of Systems     Objective:   Physical Exam  Constitutional: He appears well-developed and well-nourished. No distress.  Psychiatric:  anxious       Assessment:    1. Cigarette nicotine dependence without complication - buPROPion (WELLBUTRIN SR) 150 MG 12 hr tablet; Take 1 tablet (150 mg total) by mouth 2 (two) times daily. Start at one pill for the first week  Dispense: 60 tablet; Refill: 0  2. Need for influenza vaccination    Plan:    Also use the nicotine patch and proceed with hypnosis. Phone f/u in 3-4 weeks.

## 2018-05-10 ENCOUNTER — Ambulatory Visit: Payer: Self-pay | Admitting: Urology

## 2018-05-19 ENCOUNTER — Encounter: Payer: Self-pay | Admitting: Urology

## 2018-05-19 ENCOUNTER — Ambulatory Visit (INDEPENDENT_AMBULATORY_CARE_PROVIDER_SITE_OTHER): Payer: Medicare Other | Admitting: Urology

## 2018-05-19 VITALS — BP 127/73 | HR 93 | Ht 72.0 in | Wt 219.4 lb

## 2018-05-19 DIAGNOSIS — Z8546 Personal history of malignant neoplasm of prostate: Secondary | ICD-10-CM | POA: Diagnosis not present

## 2018-05-19 DIAGNOSIS — R339 Retention of urine, unspecified: Secondary | ICD-10-CM | POA: Diagnosis not present

## 2018-05-19 DIAGNOSIS — N393 Stress incontinence (female) (male): Secondary | ICD-10-CM | POA: Diagnosis not present

## 2018-05-19 LAB — URINALYSIS, COMPLETE
Bilirubin, UA: NEGATIVE
GLUCOSE, UA: NEGATIVE
KETONES UA: NEGATIVE
Leukocytes, UA: NEGATIVE
NITRITE UA: NEGATIVE
Protein, UA: NEGATIVE
RBC UA: NEGATIVE
Specific Gravity, UA: 1.015 (ref 1.005–1.030)
UUROB: 0.2 mg/dL (ref 0.2–1.0)
pH, UA: 6.5 (ref 5.0–7.5)

## 2018-05-19 LAB — BLADDER SCAN AMB NON-IMAGING

## 2018-05-19 NOTE — Progress Notes (Signed)
05/19/2018 2:23 PM   Richard Bean Jun 20, 1951 [id number]  Referring provider: Carmon Ginsberg, [address] [place] [address], [place] [address]  Chief Complaint  Patient presents with  . Establish Care  . Prostate Cancer    HPI: 67 year-old male presents to establish local urologic care.  He has a history of adenocarcinoma the prostate and is status post RALP in [place] in 2007.  He has most recently been followed by Dr. Jacqlyn Larsen and saw him in March 2018.  He was placed on imipramine for lower urinary tract symptoms/stress incontinence which he states has been effective.  He is taking 25 mg daily.  He has no bothersome lower urinary tract symptoms.  His last PSA March 2018 was undetectable at <0.1.  Denies dysuria or gross hematuria.  Denies flank, abdominal, pelvic or scrotal pain.   PMH: Past Medical History:  Diagnosis Date  . Anxiety   . Cancer ([place])   . Depression   . GERD (gastroesophageal reflux disease)   . H/O prostate cancer   . Heart murmur   . Panic disorder with agoraphobia     Surgical History: Past Surgical History:  Procedure Laterality Date  . ANKLE SURGERY Right   . PROSTATE SURGERY      Home Medications:  Allergies as of 05/19/2018      Reactions   Chlorpheniramine-phenylephrine       Medication List        Accurate as of 05/19/18  2:23 PM. Always use your most recent med list.          amLODipine 5 MG tablet Commonly known as:  NORVASC Take 5 mg by mouth daily.   buPROPion 150 MG 12 hr tablet Commonly known as:  WELLBUTRIN SR Take 1 tablet (150 mg total) by mouth 2 (two) times daily. Start at one pill for the first week   enalapril 5 MG tablet Commonly known as:  VASOTEC Take 5 mg by mouth daily.   escitalopram 20 MG tablet Commonly known as:  LEXAPRO Take 1 tablet (20 mg total) by mouth daily.   imipramine 25 MG tablet Commonly known as:  TOFRANIL Take 1 tablet (25 mg total) by mouth at bedtime.   sildenafil 20 MG  tablet Commonly known as:  REVATIO Take 20 mg by mouth as directed.       Allergies:  Allergies  Allergen Reactions  . Chlorpheniramine-Phenylephrine     Family History: Family History  Problem Relation Age of Onset  . COPD Mother   . Prostate cancer Father   . Cancer - Lung Father   . Depression Sister   . Stomach cancer Sister   . Alcohol abuse Brother   . Heart block Brother   . Breast cancer Sister   . Prostate cancer Brother   . Skin cancer Brother     Social History:  reports that he has been smoking cigarettes. He started smoking about 47 years ago. He has a 70.50 pack-year smoking history. He has never used smokeless tobacco. He reports that he drinks about 16.0 standard drinks of alcohol per week. He reports that he does not use drugs.  ROS: UROLOGY Frequent Urination?: No Hard to postpone urination?: Yes Burning/pain with urination?: No Get up at night to urinate?: No Leakage of urine?: Yes Urine stream starts and stops?: No Trouble starting stream?: No Do you have to strain to urinate?: No Blood in urine?: No Urinary tract infection?: No Sexually transmitted disease?: No Injury to kidneys  or bladder?: No Painful intercourse?: No Weak stream?: Yes Erection problems?: Yes Penile pain?: No  Gastrointestinal Nausea?: No Vomiting?: No Indigestion/heartburn?: Yes Diarrhea?: No Constipation?: No  Constitutional Fever: No Night sweats?: No Weight loss?: No Fatigue?: No  Skin Skin rash/lesions?: No Itching?: No  Eyes Blurred vision?: No Double vision?: No  Ears/Nose/Throat Sore throat?: No Sinus problems?: No  Hematologic/Lymphatic Swollen glands?: No Easy bruising?: No  Cardiovascular Leg swelling?: No Chest pain?: No  Respiratory Cough?: Yes Shortness of breath?: No  Endocrine Excessive thirst?: No  Musculoskeletal Back pain?: No Joint pain?: Yes  Neurological Headaches?: No Dizziness?: No  Psychologic Depression?:  No Anxiety?: Yes  Physical Exam: BP 127/73 (BP Location: Left Arm, Patient Position: Sitting, Cuff Size: Large)   Pulse 93   Ht 6' (1.829 m)   Wt 219 lb 6.4 oz (99.5 kg)   BMI 29.76 kg/m   Constitutional:  Alert and oriented, No acute distress. Cardiovascular: No clubbing, cyanosis, or edema. Respiratory: Normal respiratory effort, no increased work of breathing. Lymph: No cervical or inguinal lymphadenopathy. Skin: No rashes, bruises or suspicious lesions. Neurologic: Grossly intact, no focal deficits, moving all 4 extremities. Psychiatric: Normal mood and affect.   Assessment & Plan:   67 year old male with a history of adenocarcinoma the prostate status post RALP over 10 years ago.  His PSA has remained undetectable.  A PSA was ordered today and he will be notified with results.  Imipramine was refilled and he will continue annual follow-up.   Abbie Sons, [place] [address], [place], [place] [address] [phone]

## 2018-05-20 ENCOUNTER — Telehealth: Payer: Self-pay | Admitting: Family Medicine

## 2018-05-20 LAB — PSA: Prostate Specific Ag, Serum: <0.1 ng/mL (ref 0.0–4.0)

## 2018-05-20 NOTE — Telephone Encounter (Signed)
Patient notified

## 2018-05-20 NOTE — Telephone Encounter (Signed)
-----   Message from Abbie Sons, MD sent at 05/20/2018  8:53 AM EDT ----- PSA is undetectable at <0.1.  Urinalysis was normal.

## 2018-06-02 DIAGNOSIS — N183 Chronic kidney disease, stage 3 (moderate): Secondary | ICD-10-CM | POA: Diagnosis not present

## 2018-06-02 DIAGNOSIS — I129 Hypertensive chronic kidney disease with stage 1 through stage 4 chronic kidney disease, or unspecified chronic kidney disease: Secondary | ICD-10-CM | POA: Diagnosis not present

## 2018-06-02 DIAGNOSIS — Z720 Tobacco use: Secondary | ICD-10-CM | POA: Diagnosis not present

## 2018-06-02 DIAGNOSIS — N400 Enlarged prostate without lower urinary tract symptoms: Secondary | ICD-10-CM | POA: Diagnosis not present

## 2018-06-08 DIAGNOSIS — N183 Chronic kidney disease, stage 3 (moderate): Secondary | ICD-10-CM | POA: Diagnosis not present

## 2018-06-08 DIAGNOSIS — I129 Hypertensive chronic kidney disease with stage 1 through stage 4 chronic kidney disease, or unspecified chronic kidney disease: Secondary | ICD-10-CM | POA: Diagnosis not present

## 2018-08-01 ENCOUNTER — Ambulatory Visit: Payer: Medicare Other | Admitting: Psychiatry

## 2018-08-08 ENCOUNTER — Ambulatory Visit (INDEPENDENT_AMBULATORY_CARE_PROVIDER_SITE_OTHER): Payer: Medicare Other | Admitting: Psychiatry

## 2018-08-08 ENCOUNTER — Encounter: Payer: Self-pay | Admitting: Psychiatry

## 2018-08-08 ENCOUNTER — Other Ambulatory Visit: Payer: Self-pay

## 2018-08-08 VITALS — BP 135/79 | HR 102 | Temp 98.5°F | Wt 233.4 lb

## 2018-08-08 DIAGNOSIS — F39 Unspecified mood [affective] disorder: Secondary | ICD-10-CM | POA: Diagnosis not present

## 2018-08-08 DIAGNOSIS — F4001 Agoraphobia with panic disorder: Secondary | ICD-10-CM

## 2018-08-08 MED ORDER — ESCITALOPRAM OXALATE 20 MG PO TABS: 20.0000 mg | ORAL_TABLET | Freq: Every day | ORAL | 2 refills | Status: DC

## 2018-08-08 NOTE — Progress Notes (Signed)
BH MD/PA/NP OP Progress Note  08/08/2018 3:08 PM Richard Bean  MRN:  [id number]  Subjective:     Patient is a 68 year old male who presented for follow-up. He stated that he has been doing well and has been taking his Lexapro 20 mg on a daily basis. Patient reported that he stayed locally for the holidays and did not travel as his daughter lives in [place].  Patient reported that he has been doing well and has been trying to quit smoking.  He was prescribed bupropion by his primary care physician but he stopped the medication after 1 month as the medication was not helpful.  He reported that he is using the patch at this time.  He reported that he is only smoking 3 to 4 cigarettes on a weekly basis.  He stated that he is still struggling to stop smoking.  He remains pleasant and cooperative during the interview as usual.  No side effects of the medications reported at this time.  He denied having any suicidal homicidal ideations.  He drinks 3-4 times per week with his friends when he go out to play pool with them.  He stated that he does not have any addiction and does not have any withdrawal symptoms.    No acute issues noted at this time.  He denies having any withdrawal symptoms and continues to minimize his use of alcohol at this time..   . He appears pleasant and cooperative during the interview.    Chief Complaint:  Chief Complaint    Follow-up; Medication Refill     Visit Diagnosis:     ICD-10-CM   1. Panic disorder with agoraphobia and mild panic attacks F40.01   2. Episodic mood disorder (HCC) F39     Past Medical History:  Past Medical History:  Diagnosis Date  . Anxiety   . Cancer ([place])   . Depression   . GERD (gastroesophageal reflux disease)   . H/O prostate cancer   . Heart murmur   . Panic disorder with agoraphobia     Past Surgical History:  Procedure Laterality Date  . ANKLE SURGERY Right   . PROSTATE SURGERY     Family History:  Family History  Problem  Relation Age of Onset  . COPD Mother   . Prostate cancer Father   . Cancer - Lung Father   . Depression Sister   . Stomach cancer Sister   . Alcohol abuse Brother   . Heart block Brother   . Breast cancer Sister   . Prostate cancer Brother   . Skin cancer Brother    Social History:  Social History   Socioeconomic History  . Marital status: Married    Spouse name: Not on file  . Number of children: Not on file  . Years of education: Not on file  . Highest education level: Not on file  Occupational History  . Not on file  Social Needs  . Financial resource strain: Not hard at all  . Food insecurity:    Worry: Never true    Inability: Never true  . Transportation needs:    Medical: No    Non-medical: No  Tobacco Use  . Smoking status: Current Every Day Smoker    Packs/day: 1.50    Years: 47.00    Pack years: 70.50    Types: Cigarettes    Start date: 05/06/1971  . Smokeless tobacco: Never Used  Substance and Sexual Activity  . Alcohol use: Yes  Alcohol/week: 16.0 standard drinks    Types: 6 Cans of beer, 10 Shots of liquor per week  . Drug use: No  . Sexual activity: Yes  Lifestyle  . Physical activity:    Days per week: 0 days    Minutes per session: 0 min  . Stress: Not on file  Relationships  . Social connections:    Talks on phone: Not on file    Gets together: Not on file    Attends religious service: Not on file    Active member of club or organization: Not on file    Attends meetings of clubs or organizations: Not on file    Relationship status: Not on file  Other Topics Concern  . Not on file  Social History Narrative  . Not on file   Additional History:  Lives with his wife.  Assessment:   Musculoskeletal: Strength & Muscle Tone: within normal limits Gait & Station: normal Patient leans: N/A  Psychiatric Specialty Exam: Medication Refill  Associated symptoms include neck pain. Pertinent negatives include no abdominal pain or chills.     Review of Systems  Constitutional: Negative for chills.  HENT: Negative for hearing loss and nosebleeds.   Eyes: Negative for photophobia.  Respiratory: Negative for hemoptysis.   Cardiovascular: Negative for palpitations.  Gastrointestinal: Negative for abdominal pain.  Genitourinary: Negative for frequency.  Musculoskeletal: Positive for back pain, joint pain and neck pain.  Neurological: Negative for sensory change.  Endo/Heme/Allergies: Negative for environmental allergies.  Psychiatric/Behavioral: Negative for hallucinations and substance abuse.  All other systems reviewed and are negative.   Blood pressure 135/79, pulse (!) 102, temperature 98.5 F (36.9 C), temperature source Oral, weight 233 lb 6.4 oz (105.9 kg).Body mass index is 31.65 kg/m.  General Appearance: Casual  Eye Contact:  Fair  Speech:  Clear and Coherent  Volume:  Normal  Mood:  Anxious  Affect:  Congruent  Thought Process:  Intact  Orientation:  Full (Time, Place, and Person)  Thought Content:  WDL  Suicidal Thoughts:  No  Homicidal Thoughts:  No  Memory:  Immediate;   Fair  Judgement:  Fair  Insight:  Fair  Psychomotor Activity:  Normal  Concentration:  Fair  Recall:  [employer] of Knowledge: Fair  Language: Fair  Akathisia:  No  Handed:  Right  AIMS (if indicated):    Assets:  Communication Skills Desire for Improvement Physical Health  ADL's:  Intact  Cognition: WNL  Sleep:     Is the patient at risk to self?  No. Has the patient been a risk to self in the past 6 months?  No. Has the patient been a risk to self within the distant past?  No. Is the patient a risk to others?  No. Has the patient been a risk to others in the past 6 months?  No. Has the patient been a risk to others within the distant past?  No.  Current Medications: Current Outpatient Medications  Medication Sig Dispense Refill  . amLODipine (NORVASC) 5 MG tablet Take 5 mg by mouth daily.    Marland Kitchen buPROPion (WELLBUTRIN  SR) 150 MG 12 hr tablet Take 1 tablet (150 mg total) by mouth 2 (two) times daily. Start at one pill for the first week 60 tablet 0  . enalapril (VASOTEC) 5 MG tablet Take 5 mg by mouth daily.    Marland Kitchen escitalopram (LEXAPRO) 20 MG tablet Take 1 tablet (20 mg total) by mouth daily. 90 tablet 2  .  imipramine (TOFRANIL) 25 MG tablet Take 1 tablet (25 mg total) by mouth at bedtime. 90 tablet 3  . sildenafil (REVATIO) 20 MG tablet Take 20 mg by mouth as directed.     No current facility-administered medications for this visit.     Medical Decision Making:  Established Problem, Stable/Improving (1), Review and summation of old records (2) and Review of Last Therapy Session (1)  Treatment Plan Summary:Medication management   Anxiety symptoms  Lexapro 20 mg daily.  90 day supply of the Lexapro was filled.  Propanolol 10 mg when necessary as needed for panic attacks-has supply  Follow-up in 3 months or earlier   More than 50% of the time spent in psychoeducation, counseling and coordination of care.     This note was generated in part or whole with voice recognition software. Voice regonition is usually quite accurate but there are transcription errors that can and very often do occur. I apologize for any typographical errors that were not detected and corrected.   Rainey Pines, MD  08/08/2018, 3:08 PM

## 2018-11-07 ENCOUNTER — Other Ambulatory Visit: Payer: Self-pay

## 2018-11-07 ENCOUNTER — Encounter: Payer: Self-pay | Admitting: Psychiatry

## 2018-11-07 ENCOUNTER — Ambulatory Visit (INDEPENDENT_AMBULATORY_CARE_PROVIDER_SITE_OTHER): Payer: Medicare Other | Admitting: Psychiatry

## 2018-11-07 DIAGNOSIS — F4001 Agoraphobia with panic disorder: Secondary | ICD-10-CM

## 2018-11-07 MED ORDER — ESCITALOPRAM OXALATE 20 MG PO TABS: 20.0000 mg | ORAL_TABLET | Freq: Every day | ORAL | 2 refills | Status: DC

## 2018-11-07 NOTE — Progress Notes (Signed)
Patient ID: Richard Bean, male   DOB: 03-08-1951, 68 y.o.   MRN: [id number]  Patient is a 68 year old male with history of panic disorder who was followed up for his medication. He reported that he has been staying at home due to Covid. He stated that he has some friends who will visit him occasionally.he stated that he is compliant with his medications. He traveled from [place] few weeks ago and had a panic attack at that time. However he was able to pull up and control his symptoms. He currently denied side effects of his medications. He is compliant with the medications. No other acute symptoms noted at this time. He denied having any suicidal ideations or plans.  Plan Will refill his medications. He will follow up and three months earlier depending on his symptoms. I have discussed the assessment and treatment plan with the patient. The patient was provided an opportunity to ask questions and all were answered. The patient agreed with the plan and demonstrated an understanding of the instructions.   The patient was advised to call back or seek an in-person evaluation if the symptoms worsen or if the condition fails to improve as anticipated.   I provided 10 minutes of non-face-to-face time during this encounter.

## 2019-01-02 DIAGNOSIS — N183 Chronic kidney disease, stage 3 (moderate): Secondary | ICD-10-CM | POA: Diagnosis not present

## 2019-01-02 DIAGNOSIS — I129 Hypertensive chronic kidney disease with stage 1 through stage 4 chronic kidney disease, or unspecified chronic kidney disease: Secondary | ICD-10-CM | POA: Diagnosis not present

## 2019-02-06 ENCOUNTER — Other Ambulatory Visit: Payer: Self-pay

## 2019-02-06 ENCOUNTER — Encounter: Payer: Self-pay | Admitting: Psychiatry

## 2019-02-06 ENCOUNTER — Ambulatory Visit (INDEPENDENT_AMBULATORY_CARE_PROVIDER_SITE_OTHER): Payer: Medicare Other | Admitting: Psychiatry

## 2019-02-06 DIAGNOSIS — F39 Unspecified mood [affective] disorder: Secondary | ICD-10-CM

## 2019-02-06 DIAGNOSIS — F4001 Agoraphobia with panic disorder: Secondary | ICD-10-CM | POA: Diagnosis not present

## 2019-02-06 MED ORDER — ESCITALOPRAM OXALATE 20 MG PO TABS: 20.0000 mg | ORAL_TABLET | Freq: Every day | ORAL | 2 refills | Status: DC

## 2019-02-06 NOTE — Progress Notes (Signed)
Patient ID: Richard Bean, male   DOB: November 09, 1950, 68 y.o.   MRN: [id number]  Patient is a 68 year old male with history of more disorder and panic who was evaluated for medication adjustment. He reported that he is trying to stay safe and healthy and has been staying at home. He reported that his sister is chronically sick and they are not expecting her to live by this weekend. He's trying to take care of her. He reported that he's going to visit her at this time. He stated that he is staying at home and is taking precautions. He stated that he is compliant with his medications. He currently denied feeling depressed hopeless helpless. He denied having any suicidal homicidal ideations or plans. No other acute symptoms noted at this time.  Plan Continue medications as prescribed. He will follow in four months earlier depending on his symptoms.    I connected with patient via telemedicine application and verified that I am speaking with the correct person using two identifiers.  I discussed the limitations of evaluation and management by telemedicine and the availability of in person appointments. The patient expressed understanding and agreed to proceed.  I discussed the assessment and treatment plan with the patient. The patient was provided an opportunity to ask questions and all were answered. The patient agreed with the plan and demonstrated an understanding of the instructions.   The patient was advised to call back or seek an in-person evaluation if the symptoms worsen or if the condition fails to improve as anticipated.   I provided 15 minutes of non-face-to-face time during this encounter.

## 2019-03-02 ENCOUNTER — Telehealth: Payer: Self-pay | Admitting: Family Medicine

## 2019-03-02 DIAGNOSIS — F331 Major depressive disorder, recurrent, moderate: Secondary | ICD-10-CM

## 2019-03-02 MED ORDER — IMIPRAMINE HCL 25 MG PO TABS: 25.0000 mg | ORAL_TABLET | Freq: Every day | ORAL | 0 refills | Status: DC

## 2019-03-02 NOTE — Telephone Encounter (Signed)
[facility] faxed refill request for the following medications:  imipramine (TOFRANIL) 25 MG tablet   Please advise.

## 2019-03-02 NOTE — Telephone Encounter (Signed)
I've sent 90 days. He needs to schedule a follow up, chronic issues not addressed in one year.

## 2019-03-07 ENCOUNTER — Ambulatory Visit: Payer: Self-pay | Admitting: Physician Assistant

## 2019-03-09 ENCOUNTER — Telehealth: Payer: Self-pay | Admitting: Physician Assistant

## 2019-03-09 ENCOUNTER — Encounter: Payer: Self-pay | Admitting: Physician Assistant

## 2019-03-09 ENCOUNTER — Ambulatory Visit (INDEPENDENT_AMBULATORY_CARE_PROVIDER_SITE_OTHER): Payer: Medicare Other | Admitting: Physician Assistant

## 2019-03-09 ENCOUNTER — Other Ambulatory Visit: Payer: Self-pay

## 2019-03-09 VITALS — BP 136/72 | HR 80 | Temp 98.6°F | Resp 16 | Wt 222.6 lb

## 2019-03-09 DIAGNOSIS — J439 Emphysema, unspecified: Secondary | ICD-10-CM | POA: Diagnosis not present

## 2019-03-09 DIAGNOSIS — Z114 Encounter for screening for human immunodeficiency virus [HIV]: Secondary | ICD-10-CM

## 2019-03-09 DIAGNOSIS — Z125 Encounter for screening for malignant neoplasm of prostate: Secondary | ICD-10-CM | POA: Diagnosis not present

## 2019-03-09 DIAGNOSIS — L989 Disorder of the skin and subcutaneous tissue, unspecified: Secondary | ICD-10-CM | POA: Diagnosis not present

## 2019-03-09 DIAGNOSIS — Z23 Encounter for immunization: Secondary | ICD-10-CM | POA: Diagnosis not present

## 2019-03-09 DIAGNOSIS — I2510 Atherosclerotic heart disease of native coronary artery without angina pectoris: Secondary | ICD-10-CM

## 2019-03-09 DIAGNOSIS — Z1159 Encounter for screening for other viral diseases: Secondary | ICD-10-CM | POA: Diagnosis not present

## 2019-03-09 DIAGNOSIS — I129 Hypertensive chronic kidney disease with stage 1 through stage 4 chronic kidney disease, or unspecified chronic kidney disease: Secondary | ICD-10-CM

## 2019-03-09 DIAGNOSIS — E782 Mixed hyperlipidemia: Secondary | ICD-10-CM

## 2019-03-09 NOTE — Progress Notes (Addendum)
Patient: Richard Bean Male    DOB: January 10, 1951   68 y.o.   MRN: [id number] Visit Date: 03/09/2019  Today's Provider: Trinna Post, PA-C   Chief Complaint  Patient presents with  . Hypertension   Subjective:    Lives in [place], [place]. Lives with dog - one grown. Two grandchildren. One granddaughter going to [employer] and Program[employer], systems, grandson graduated high school. Daughter lives in [place]. Retired, used to work as a Engineer, maintenance. Previously saw Mariel Sleet, who has since retired.   HPI   Colonoscopy: 10/22/2017, pathology not in chart.  Prostate Cancer Screening: History of prostatectomy, followed by Dr. Bernardo Heater at urology.  Lung Cancer Screening: Due 03/2019 Shingles Vaccine: Zostavax 2015, never got Shingrix Prevnar: done Pneumovax: due today   Hypertension, follow-up:  BP Readings from Last 3 Encounters:  03/09/19 136/72  05/19/18 127/73  05/05/18 126/80    He was last seen for hypertension 1 years ago.  BP at that visit was 120/70. Currently taking amlodipine 5 mg and enalapril 5 mg.  Management changes since that visit include none. He reports excellent compliance with treatment. He is not having side effects.  He is not exercising. He is adherent to low salt diet.   Outside blood pressures are not being checked. He is experiencing none.  Patient denies chest pain, chest pressure/discomfort, claudication, dyspnea, exertional chest pressure/discomfort, fatigue, irregular heart beat, lower extremity edema, near-syncope, orthopnea, palpitations, paroxysmal nocturnal dyspnea, syncope and tachypnea.   Cardiovascular risk factors include advanced age (older than 71 for men, 1 for women), hypertension, male gender and smoking/ tobacco exposure.  Use of agents associated with hypertension: none.     Weight trend: stable Wt Readings from Last 3 Encounters:  03/09/19 222 lb 9.6 oz (101 kg)  05/19/18 219 lb 6.4 oz (99.5 kg)  05/05/18 223 lb (101.2 kg)     Current diet: well balanced  History of Prostate Cancer: Prostatectomy in [place]. Did not have chemo or radiation. Takes imipramine for urinary leakage. Sees Dr. Bernardo Heater with urology.    Tobacco Abuse: Continues. He is working on cutting down.   COPD/Emphysema: Present on most recent Lung cancer screening. He reports a cough but denies wheezing or SOB. May have some wheezing or SOB after activity. Declines inhaler.   CAD: Aortic atherosclerosis and 3 vessel disease on previous lung cancer screening.   HLD:  Lipid Panel     Component Value Date/Time   CHOL 265 (H) 10/12/2016 1505   TRIG 118 10/12/2016 1505   HDL 72 10/12/2016 1505   CHOLHDL 3.7 10/12/2016 1505   LDLCALC 169 (H) 10/12/2016 1505   The CVD Risk score (D'Agostino, et al., 2008) failed to calculate for the following reasons:   CVD risk score not calculated  CKD: Stage III. Currently sees Dr. [name] at [facility]. Takes amlodipine 5 mg daily and enalapril 5 mg daily.   CMP Latest Ref Rng & Units 10/12/2016 07/09/2015 01/24/2015  Glucose 65 - 99 mg/dL 99 94 98  BUN 8 - 27 mg/dL 21 20 19   Creatinine 0.76 - 1.27 mg/dL 1.48(H) 1.76(H) 1.42(H)  Sodium 134 - 144 mmol/L 139 139 138  Potassium 3.5 - 5.2 mmol/L 5.1 4.3 5.2  Chloride 96 - 106 mmol/L 97 98 97  CO2 18 - 29 mmol/L 25 26 25   Calcium 8.6 - 10.2 mg/dL 9.9 9.9 9.8  Total Protein 6.0 - 8.5 g/dL 7.5 6.0 -  Total Bilirubin 0.0 -  1.2 mg/dL 0.5 0.3 -  Alkaline Phos 39 - 117 IU/L 142(H) 113 -  AST 0 - 40 IU/L 24 19 -  ALT 0 - 44 IU/L 29 18 -    Reports he has two skin lesions that he wants to check and make sure they are not cancer.  ------------------------------------------------------------------------  Allergies  Allergen Reactions  . Chlorpheniramine-Phenylephrine      Current Outpatient Medications:  .  amLODipine (NORVASC) 5 MG tablet, Take 5 mg by mouth daily., Disp: , Rfl:  .  enalapril (VASOTEC) 5 MG tablet, Take 5 mg by  mouth daily., Disp: , Rfl:  .  escitalopram (LEXAPRO) 20 MG tablet, Take 1 tablet (20 mg total) by mouth daily., Disp: 90 tablet, Rfl: 2 .  imipramine (TOFRANIL) 25 MG tablet, Take 1 tablet (25 mg total) by mouth at bedtime., Disp: 90 tablet, Rfl: 0 .  sildenafil (REVATIO) 20 MG tablet, Take 20 mg by mouth as directed., Disp: , Rfl:   Review of Systems  Social History   Tobacco Use  . Smoking status: Current Every Day Smoker    Packs/day: 1.50    Years: 47.00    Pack years: 70.50    Types: Cigarettes    Start date: 05/06/1971  . Smokeless tobacco: Never Used  Substance Use Topics  . Alcohol use: Yes    Alcohol/week: 16.0 standard drinks    Types: 6 Cans of beer, 10 Shots of liquor per week      Objective:   BP 136/72   Pulse 80   Temp 98.6 F (37 C) (Oral)   Resp 16   Wt 222 lb 9.6 oz (101 kg)   BMI 30.19 kg/m  Vitals:   03/09/19 1033  BP: 136/72  Pulse: 80  Resp: 16  Temp: 98.6 F (37 C)  TempSrc: Oral  Weight: 222 lb 9.6 oz (101 kg)     Physical Exam Constitutional:      Appearance: Normal appearance. He is obese.  Cardiovascular:     Rate and Rhythm: Normal rate and regular rhythm.     Heart sounds: Normal heart sounds.  Pulmonary:     Effort: Pulmonary effort is normal.     Breath sounds: Normal breath sounds.  Abdominal:     General: Abdomen is flat. Bowel sounds are normal.     Palpations: Abdomen is soft.  Skin:    General: Skin is warm and dry.     Comments: Hyperpigmented papule on right hand and abdomen.   Neurological:     Mental Status: He is alert and oriented to person, place, and time. Mental status is at baseline.  Psychiatric:        Mood and Affect: Mood normal.        Behavior: Behavior normal.      No results found for any visits on 03/09/19.     Assessment & Plan     2. Hypertension, renal disease, stage 1-4 or unspecified chronic kidney disease  Followed by Dr. [name].   - Comprehensive Metabolic Panel (CMET) - CBC  with Differential - TSH  3. Mixed hyperlipidemia  Have counseled that he has elevated lipids, 3 vessel CAD evident on lung CT and is at a high risk for having a heart attack or a stroke. Strongly recommend statin to reduce this risk. He refuses. States he feels side effects of the medication are worse than any potential benefits at this point.   - Lipid Profile  4. Coronary artery disease  involving native coronary artery of native heart without angina pectoris  See #3.   5. Encounter for screening for HIV  - HIV antibody (with reflex)  6. Encounter for hepatitis C screening test for low risk patient  - Hepatitis C antibody  7. Prostate cancer screening  Followed by urology.  8. Pulmonary emphysema, unspecified emphysema type (HCC)  Declines inhalers. Counseled on smoking cessation.   9. Need for Streptococcus pneumoniae vaccination  - Pneumococcal polysaccharide vaccine 23-valent greater than or equal to 2yo subcutaneous/IM  10. Skin lesion  - Ambulatory referral to Dermatology  The entirety of the information documented in the History of Present Illness, Review of Systems and Physical Exam were personally obtained by me. Portions of this information were initially documented by Jennings Books, CMA and reviewed by me for thoroughness and accuracy.      Trinna Post, PA-C  [place] as a scribe for Trinna Post, PA-C.,have documented all relevant documentation on the behalf of Trinna Post, PA-C,as directed by  Trinna Post, PA-C while in the presence of Trinna Post, PA-C.

## 2019-03-09 NOTE — Patient Instructions (Signed)
Shingrix: shingles shot, call insurance and see where they pay for it - 2 doses    Health Maintenance After Age 69 After age 31, you are at a higher risk for certain long-term diseases and infections as well as injuries from falls. Falls are a major cause of broken bones and head injuries in people who are older than age 27. Getting regular preventive care can help to keep you healthy and well. Preventive care includes getting regular testing and making lifestyle changes as recommended by your health care provider. Talk with your health care provider about:  Which screenings and tests you should have. A screening is a test that checks for a disease when you have no symptoms.  A diet and exercise plan that is right for you. What should I know about screenings and tests to prevent falls? Screening and testing are the best ways to find a health problem early. Early diagnosis and treatment give you the best chance of managing medical conditions that are common after age 88. Certain conditions and lifestyle choices may make you more likely to have a fall. Your health care provider may recommend:  Regular vision checks. Poor vision and conditions such as cataracts can make you more likely to have a fall. If you wear glasses, make sure to get your prescription updated if your vision changes.  Medicine review. Work with your health care provider to regularly review all of the medicines you are taking, including over-the-counter medicines. Ask your health care provider about any side effects that may make you more likely to have a fall. Tell your health care provider if any medicines that you take make you feel dizzy or sleepy.  Osteoporosis screening. Osteoporosis is a condition that causes the bones to get weaker. This can make the bones weak and cause them to break more easily.  Blood pressure screening. Blood pressure changes and medicines to control blood pressure can make you feel dizzy.  Strength  and balance checks. Your health care provider may recommend certain tests to check your strength and balance while standing, walking, or changing positions.  Foot health exam. Foot pain and numbness, as well as not wearing proper footwear, can make you more likely to have a fall.  Depression screening. You may be more likely to have a fall if you have a fear of falling, feel emotionally low, or feel unable to do activities that you used to do.  Alcohol use screening. Using too much alcohol can affect your balance and may make you more likely to have a fall. What actions can I take to lower my risk of falls? General instructions  Talk with your health care provider about your risks for falling. Tell your health care provider if: ? You fall. Be sure to tell your health care provider about all falls, even ones that seem minor. ? You feel dizzy, sleepy, or off-balance.  Take over-the-counter and prescription medicines only as told by your health care provider. These include any supplements.  Eat a healthy diet and maintain a healthy weight. A healthy diet includes low-fat dairy products, low-fat (lean) meats, and fiber from whole grains, beans, and lots of fruits and vegetables. Home safety  Remove any tripping hazards, such as rugs, cords, and clutter.  Install safety equipment such as grab bars in bathrooms and safety rails on stairs.  Keep rooms and walkways well-lit. Activity   Follow a regular exercise program to stay fit. This will help you maintain your balance. Ask your  health care provider what types of exercise are appropriate for you.  If you need a cane or walker, use it as recommended by your health care provider.  Wear supportive shoes that have nonskid soles. Lifestyle  Do not drink alcohol if your health care provider tells you not to drink.  If you drink alcohol, limit how much you have: ? 0-1 drink a day for women. ? 0-2 drinks a day for men.  Be aware of how  much alcohol is in your drink. In the [place] one drink equals one typical bottle of beer (12 oz), one-half glass of wine (5 oz), or one shot of hard liquor (1 oz).  Do not use any products that contain nicotine or tobacco, such as cigarettes and e-cigarettes. If you need help quitting, ask your health care provider. Summary  Having a healthy lifestyle and getting preventive care can help to protect your health and wellness after age 65.  Screening and testing are the best way to find a health problem early and help you avoid having a fall. Early diagnosis and treatment give you the best chance for managing medical conditions that are more common for people who are older than age 32.  Falls are a major cause of broken bones and head injuries in people who are older than age 48. Take precautions to prevent a fall at home.  Work with your health care provider to learn what changes you can make to improve your health and wellness and to prevent falls. This information is not intended to replace advice given to you by your health care provider. Make sure you discuss any questions you have with your health care provider. Document Released: 05/26/2017 Document Revised: 11/03/2018 Document Reviewed: 05/26/2017 Elsevier Patient Education  2020 [employer].

## 2019-03-09 NOTE — Telephone Encounter (Signed)
Request pending. KW

## 2019-03-09 NOTE — Telephone Encounter (Signed)
Please request and abstract colonoscopy pathology from [facility], thank you.

## 2019-03-10 ENCOUNTER — Telehealth: Payer: Self-pay

## 2019-03-10 LAB — CBC WITH DIFFERENTIAL/PLATELET
Basophils Absolute: 0.1 10*3/uL (ref 0.0–0.2)
Basos: 1 %
EOS (ABSOLUTE): 0.1 10*3/uL (ref 0.0–0.4)
Eos: 1 %
Hematocrit: 45.4 % (ref 37.5–51.0)
Hemoglobin: 15.9 g/dL (ref 13.0–17.7)
Immature Grans (Abs): 0.1 10*3/uL (ref 0.0–0.1)
Immature Granulocytes: 1 %
Lymphocytes Absolute: 1.4 10*3/uL (ref 0.7–3.1)
Lymphs: 17 %
MCH: 30.7 pg (ref 26.6–33.0)
MCHC: 35 g/dL (ref 31.5–35.7)
MCV: 88 fL (ref 79–97)
Monocytes Absolute: 0.6 10*3/uL (ref 0.1–0.9)
Monocytes: 8 %
Neutrophils Absolute: 5.9 10*3/uL (ref 1.4–7.0)
Neutrophils: 72 %
Platelets: 300 10*3/uL (ref 150–450)
RBC: 5.18 x10E6/uL (ref 4.14–5.80)
RDW: 13.1 % (ref 11.6–15.4)
WBC: 8.1 10*3/uL (ref 3.4–10.8)

## 2019-03-10 LAB — COMPREHENSIVE METABOLIC PANEL
ALT: 27 IU/L (ref 0–44)
AST: 26 IU/L (ref 0–40)
Albumin/Globulin Ratio: 2.2 (ref 1.2–2.2)
Albumin: 4.8 g/dL (ref 3.8–4.8)
Alkaline Phosphatase: 122 IU/L — ABNORMAL HIGH (ref 39–117)
BUN/Creatinine Ratio: 16 (ref 10–24)
BUN: 20 mg/dL (ref 8–27)
Bilirubin Total: 0.4 mg/dL (ref 0.0–1.2)
CO2: 21 mmol/L (ref 20–29)
Calcium: 9.6 mg/dL (ref 8.6–10.2)
Chloride: 100 mmol/L (ref 96–106)
Creatinine, Ser: 1.29 mg/dL — ABNORMAL HIGH (ref 0.76–1.27)
GFR calc Af Amer: 65 mL/min/{1.73_m2} (ref >59)
GFR calc non Af Amer: 57 mL/min/{1.73_m2} — ABNORMAL LOW (ref >59)
Globulin, Total: 2.2 g/dL (ref 1.5–4.5)
Glucose: 92 mg/dL (ref 65–99)
Potassium: 4.4 mmol/L (ref 3.5–5.2)
Sodium: 137 mmol/L (ref 134–144)
Total Protein: 7 g/dL (ref 6.0–8.5)

## 2019-03-10 LAB — TSH: TSH: 1.27 u[IU]/mL (ref 0.450–4.500)

## 2019-03-10 LAB — LIPID PANEL
Chol/HDL Ratio: 4.2 ratio (ref 0.0–5.0)
Cholesterol, Total: 236 mg/dL — ABNORMAL HIGH (ref 100–199)
HDL: 56 mg/dL (ref >39)
LDL Calculated: 155 mg/dL — ABNORMAL HIGH (ref 0–99)
Triglycerides: 126 mg/dL (ref 0–149)
VLDL Cholesterol Cal: 25 mg/dL (ref 5–40)

## 2019-03-10 LAB — HIV ANTIBODY (ROUTINE TESTING W REFLEX): HIV Screen 4th Generation wRfx: NONREACTIVE

## 2019-03-10 LAB — HEPATITIS C ANTIBODY: Hep C Virus Ab: <0.1 s/co ratio (ref 0.0–0.9)

## 2019-03-10 NOTE — Telephone Encounter (Signed)
-----   Message from Trinna Post, [place] sent at 03/10/2019  9:05 AM EDT ----- Kidney function stable and actually slightly better than last lab. Cholesterol elevated and I have recommended cholesterol medication which he has declined. Remaining labwork see normal. See him in a year.

## 2019-03-10 NOTE — Telephone Encounter (Signed)
[id number] 

## 2019-03-13 NOTE — Addendum Note (Signed)
Addended by: Trinna Post on: 03/13/2019 04:01 PM   Modules accepted: Level of Service

## 2019-03-13 NOTE — Telephone Encounter (Signed)
LMTCB for lab results.  

## 2019-03-14 NOTE — Telephone Encounter (Signed)
lmtcb

## 2019-03-15 NOTE — Telephone Encounter (Signed)
Patient advised as directed below. 

## 2019-04-10 DIAGNOSIS — L820 Inflamed seborrheic keratosis: Secondary | ICD-10-CM | POA: Diagnosis not present

## 2019-04-10 DIAGNOSIS — L570 Actinic keratosis: Secondary | ICD-10-CM | POA: Diagnosis not present

## 2019-04-10 DIAGNOSIS — D229 Melanocytic nevi, unspecified: Secondary | ICD-10-CM | POA: Diagnosis not present

## 2019-04-10 DIAGNOSIS — D225 Melanocytic nevi of trunk: Secondary | ICD-10-CM | POA: Diagnosis not present

## 2019-04-10 DIAGNOSIS — Z1283 Encounter for screening for malignant neoplasm of skin: Secondary | ICD-10-CM | POA: Diagnosis not present

## 2019-04-10 DIAGNOSIS — D224 Melanocytic nevi of scalp and neck: Secondary | ICD-10-CM | POA: Diagnosis not present

## 2019-04-11 ENCOUNTER — Telehealth: Payer: Self-pay | Admitting: *Deleted

## 2019-04-11 NOTE — Telephone Encounter (Signed)
Left message for patient to notify them that it is time to schedule annual low dose lung cancer screening CT scan. Instructed patient to call back to verify information prior to the scan being scheduled.  

## 2019-05-24 ENCOUNTER — Ambulatory Visit: Payer: Self-pay | Admitting: Urology

## 2019-05-29 ENCOUNTER — Encounter: Payer: Self-pay | Admitting: Urology

## 2019-05-29 ENCOUNTER — Ambulatory Visit (INDEPENDENT_AMBULATORY_CARE_PROVIDER_SITE_OTHER): Payer: Medicare Other | Admitting: Urology

## 2019-05-29 ENCOUNTER — Other Ambulatory Visit: Payer: Self-pay

## 2019-05-29 VITALS — BP 136/77 | HR 93 | Ht 72.0 in | Wt 224.5 lb

## 2019-05-29 DIAGNOSIS — Z8546 Personal history of malignant neoplasm of prostate: Secondary | ICD-10-CM

## 2019-05-29 DIAGNOSIS — N393 Stress incontinence (female) (male): Secondary | ICD-10-CM

## 2019-05-29 DIAGNOSIS — R339 Retention of urine, unspecified: Secondary | ICD-10-CM | POA: Diagnosis not present

## 2019-05-29 DIAGNOSIS — F331 Major depressive disorder, recurrent, moderate: Secondary | ICD-10-CM

## 2019-05-29 LAB — BLADDER SCAN AMB NON-IMAGING

## 2019-05-29 NOTE — Progress Notes (Signed)
05/29/2019 3:02 PM   Richard Bean Jun 12, 1951 [id number]  Referring provider: Carmon Ginsberg, PA No address on file  Chief Complaint  Patient presents with  . Follow-up    Urologic history: 1.  History of prostate cancer -[facility] [place]; undetectable PSA    HPI: 68 y.o. male presents for annual follow-up.  Former patient of Dr. Jacqlyn Larsen at [facility].  He has mild stress incontinence and has continued on imipramine 25 mg daily.  He otherwise has no bothersome lower urinary tract symptoms.  He does note urinary hesitancy and decreased stream in the early morning hours.  IPSS 4/35.    Denies dysuria or gross hematuria.  No flank, abdominal or pelvic pain.  PMH: Past Medical History:  Diagnosis Date  . Anxiety   . Cancer ([place])   . Depression   . GERD (gastroesophageal reflux disease)   . H/O prostate cancer   . Heart murmur   . Panic disorder with agoraphobia     Surgical History: Past Surgical History:  Procedure Laterality Date  . ANKLE SURGERY Right   . PROSTATE SURGERY      Home Medications:  Allergies as of 05/29/2019      Reactions   Chlorpheniramine-phenylephrine       Medication List       Accurate as of May 29, 2019  3:02 PM. If you have any questions, ask your nurse or doctor.        amLODipine 5 MG tablet Commonly known as: NORVASC Take 5 mg by mouth daily.   enalapril 5 MG tablet Commonly known as: VASOTEC Take 5 mg by mouth daily.   escitalopram 20 MG tablet Commonly known as: LEXAPRO Take 1 tablet (20 mg total) by mouth daily.   imipramine 25 MG tablet Commonly known as: TOFRANIL Take 1 tablet (25 mg total) by mouth at bedtime.   sildenafil 20 MG tablet Commonly known as: REVATIO Take 20 mg by mouth as directed.       Allergies:  Allergies  Allergen Reactions  . Chlorpheniramine-Phenylephrine     Family History: Family History  Problem Relation Age of Onset  . COPD Mother   . Prostate cancer Father   . Cancer - Lung  Father   . Depression Sister   . Stomach cancer Sister   . Alcohol abuse Brother   . Heart block Brother   . Breast cancer Sister   . Prostate cancer Brother   . Skin cancer Brother     Social History:  reports that he has been smoking cigarettes. He started smoking about 48 years ago. He has a 70.50 pack-year smoking history. He has never used smokeless tobacco. He reports current alcohol use of about 16.0 standard drinks of alcohol per week. He reports that he does not use drugs.  ROS: UROLOGY Frequent Urination?: No Hard to postpone urination?: No Burning/pain with urination?: No Get up at night to urinate?: No Leakage of urine?: Yes Urine stream starts and stops?: No Trouble starting stream?: Yes Do you have to strain to urinate?: No Blood in urine?: No Urinary tract infection?: No Sexually transmitted disease?: No Injury to kidneys or bladder?: No Painful intercourse?: No Weak stream?: No Erection problems?: No Penile pain?: No  Gastrointestinal Nausea?: No Vomiting?: No Indigestion/heartburn?: No Diarrhea?: No Constipation?: No  Constitutional Fever: No Night sweats?: No Weight loss?: No Fatigue?: No  Skin Skin rash/lesions?: No Itching?: No  Eyes Blurred vision?: No Double vision?: No  Ears/Nose/Throat Sore throat?: No Sinus  problems?: No  Hematologic/Lymphatic Swollen glands?: No Easy bruising?: No  Cardiovascular Leg swelling?: No Chest pain?: No  Respiratory Cough?: No Shortness of breath?: No  Endocrine Excessive thirst?: No  Musculoskeletal Back pain?: No Joint pain?: Yes  Neurological Headaches?: No Dizziness?: No  Psychologic Depression?: No Anxiety?: No  Physical Exam: BP 136/77 (BP Location: Left Arm, Patient Position: Sitting, Cuff Size: Normal)   Pulse 93   Ht 6' (1.829 m)   Wt 224 lb 8 oz (101.8 kg)   BMI 30.45 kg/m   Constitutional:  Alert and oriented, No acute distress. HEENT: [place] AT, moist mucus  membranes.  Trachea midline, no masses. Cardiovascular: No clubbing, cyanosis, or edema. Respiratory: Normal respiratory effort, no increased work of breathing. Skin: No rashes, bruises or suspicious lesions. Neurologic: Grossly intact, no focal deficits, moving all 4 extremities. Psychiatric: Normal mood and affect.   Assessment & Plan:    - H/O malignant neoplasm of prostate PSA drawn today and he will be notified with results.  - Lower urinary tract symptoms/stress incontinence Imipramine was refilled.  PVR by bladder scan was 97 mL.  Continue annual follow-up.  Abbie Sons, [place] [address] [employer] [place], [place] [address] [phone]

## 2019-05-30 ENCOUNTER — Telehealth: Payer: Self-pay

## 2019-05-30 ENCOUNTER — Encounter: Payer: Self-pay | Admitting: Urology

## 2019-05-30 LAB — PSA: Prostate Specific Ag, Serum: <0.1 ng/mL (ref 0.0–4.0)

## 2019-05-30 MED ORDER — IMIPRAMINE HCL 25 MG PO TABS: 25.0000 mg | ORAL_TABLET | Freq: Every day | ORAL | 3 refills | Status: DC

## 2019-05-30 NOTE — Telephone Encounter (Signed)
Called pt no answer. LM per DPR informing pt of results.

## 2019-05-30 NOTE — Telephone Encounter (Signed)
-----   Message from Abbie Sons, MD sent at 05/30/2019  9:36 AM EST ----- PSA remains undetectable at <0.1

## 2019-06-07 DIAGNOSIS — L821 Other seborrheic keratosis: Secondary | ICD-10-CM | POA: Diagnosis not present

## 2019-06-07 DIAGNOSIS — L570 Actinic keratosis: Secondary | ICD-10-CM | POA: Diagnosis not present

## 2019-06-07 DIAGNOSIS — Z23 Encounter for immunization: Secondary | ICD-10-CM | POA: Diagnosis not present

## 2019-06-07 DIAGNOSIS — L578 Other skin changes due to chronic exposure to nonionizing radiation: Secondary | ICD-10-CM | POA: Diagnosis not present

## 2019-06-07 DIAGNOSIS — L820 Inflamed seborrheic keratosis: Secondary | ICD-10-CM | POA: Diagnosis not present

## 2019-06-12 ENCOUNTER — Ambulatory Visit (INDEPENDENT_AMBULATORY_CARE_PROVIDER_SITE_OTHER): Payer: Medicare Other | Admitting: Psychiatry

## 2019-06-12 ENCOUNTER — Other Ambulatory Visit: Payer: Self-pay

## 2019-06-12 ENCOUNTER — Encounter: Payer: Self-pay | Admitting: Psychiatry

## 2019-06-12 ENCOUNTER — Ambulatory Visit: Payer: Medicare Other | Admitting: Psychiatry

## 2019-06-12 DIAGNOSIS — F4001 Agoraphobia with panic disorder: Secondary | ICD-10-CM

## 2019-06-12 MED ORDER — ESCITALOPRAM OXALATE 20 MG PO TABS: 20.0000 mg | ORAL_TABLET | Freq: Every day | ORAL | 1 refills | Status: DC

## 2019-06-12 NOTE — Progress Notes (Signed)
[name] OP Progress Note  I connected with  Richard Bean on 06/12/19 by a video enabled telemedicine application and verified that I am speaking with the correct person using two identifiers.   I discussed the limitations of evaluation and management by telemedicine. The patient expressed understanding and agreed to proceed.   06/12/2019 10:04 AM Richard Bean  MRN:  [id number]  Chief Complaint: " I am doing pretty good."  HPI: Patient reported that he has been doing fine.  He said he is easygoing and overall things are going okay for him.  When asked he mentioned that his sister passed away in 2023-03-12 and last month her husband passed away as well.  He stated that this is part of life and he has been dealing with this for him.  He denied any issues or concerns.  Visit Diagnosis:    ICD-10-CM   1. Panic disorder with agoraphobia and mild panic attacks  F40.01 escitalopram (LEXAPRO) 20 MG tablet    Past Psychiatric History: Panic d/o and agoraphobia  Past Medical History:  Past Medical History:  Diagnosis Date  . Anxiety   . Cancer ([place])   . Depression   . GERD (gastroesophageal reflux disease)   . H/O prostate cancer   . Heart murmur   . Panic disorder with agoraphobia     Past Surgical History:  Procedure Laterality Date  . ANKLE SURGERY Right   . PROSTATE SURGERY      Family Psychiatric History: denied  Family History:  Family History  Problem Relation Age of Onset  . COPD Mother   . Prostate cancer Father   . Cancer - Lung Father   . Depression Sister   . Stomach cancer Sister   . Alcohol abuse Brother   . Heart block Brother   . Breast cancer Sister   . Prostate cancer Brother   . Skin cancer Brother     Social History:  Social History   Socioeconomic History  . Marital status: Married    Spouse name: Not on file  . Number of children: Not on file  . Years of education: Not on file  . Highest education level: Not on file  Occupational History  .  Not on file  Social Needs  . Financial resource strain: Not hard at all  . Food insecurity    Worry: Never true    Inability: Never true  . Transportation needs    Medical: No    Non-medical: No  Tobacco Use  . Smoking status: Current Every Day Smoker    Packs/day: 1.50    Years: 47.00    Pack years: 70.50    Types: Cigarettes    Start date: 05/06/1971  . Smokeless tobacco: Never Used  Substance and Sexual Activity  . Alcohol use: Yes    Alcohol/week: 16.0 standard drinks    Types: 6 Cans of beer, 10 Shots of liquor per week  . Drug use: No  . Sexual activity: Yes  Lifestyle  . Physical activity    Days per week: 0 days    Minutes per session: 0 min  . Stress: Not on file  Relationships  . Social Herbalist on phone: Not on file    Gets together: Not on file    Attends religious service: Not on file    Active member of club or organization: Not on file    Attends meetings of clubs or organizations: Not on file  Relationship status: Not on file  Other Topics Concern  . Not on file  Social History Narrative  . Not on file    Allergies:  Allergies  Allergen Reactions  . Chlorpheniramine-Phenylephrine     Metabolic Disorder Labs: No results found for: HGBA1C, MPG No results found for: PROLACTIN Lab Results  Component Value Date   CHOL 236 (H) 03/09/2019   TRIG 126 03/09/2019   HDL 56 03/09/2019   CHOLHDL 4.2 03/09/2019   LDLCALC 155 (H) 03/09/2019   LDLCALC 169 (H) 10/12/2016   Lab Results  Component Value Date   TSH 1.270 03/09/2019    Therapeutic Level Labs: No results found for: LITHIUM No results found for: VALPROATE No components found for:  CBMZ  Current Medications: Current Outpatient Medications  Medication Sig Dispense Refill  . amLODipine (NORVASC) 5 MG tablet Take 5 mg by mouth daily.    . enalapril (VASOTEC) 5 MG tablet Take 5 mg by mouth daily.    Marland Kitchen escitalopram (LEXAPRO) 20 MG tablet Take 1 tablet (20 mg total) by mouth  daily. 90 tablet 1  . imipramine (TOFRANIL) 25 MG tablet Take 1 tablet (25 mg total) by mouth at bedtime. 90 tablet 3  . sildenafil (REVATIO) 20 MG tablet Take 20 mg by mouth as directed.     No current facility-administered medications for this visit.     Musculoskeletal: Strength & Muscle Tone: unable to assess due to telemed visit Gait & Station: unable to assess due to telemed visit Patient leans: unable to assess due to telemed visit   Psychiatric Specialty Exam: ROS  There were no vitals taken for this visit.There is no height or weight on file to calculate BMI.  General Appearance: Fairly Groomed  Eye Contact:  Good  Speech:  Clear and Coherent and Normal Rate  Volume:  Normal  Mood:  Euthymic  Affect:  Appropriate  Thought Process:  Goal Directed, Linear and Descriptions of Associations: Intact  Orientation:  Full (Time, Place, and Person)  Thought Content: Logical   Suicidal Thoughts:  No  Homicidal Thoughts:  No  Memory:  Recent;   Good Remote;   Good  Judgement:  Fair  Insight:  Fair  Psychomotor Activity:  Normal  Concentration:  Concentration: Good and Attention Span: Good  Recall:  Good  Fund of Knowledge: Good  Language: Good  Akathisia:  Negative  Handed:  Right  AIMS (if indicated): not done  Assets:  Communication Skills Desire for Improvement Financial Resources/Insurance [place] Talents/Skills Transportation  ADL's:  Intact  Cognition: WNL  Sleep:  Fair   Screenings: PHQ2-9     Office Visit from 03/07/2018 in [place] Visit from 10/12/2016 in [facility] Visit from 06/17/2015 in [facility]  PHQ-2 Total Score  6  0  0  PHQ-9 Total Score  12  -  -       Assessment and Plan: This is a 68 year old male with history of panic disorder, agoraphobia not doing well on Lexapro. 1. Panic disorder with agoraphobia and mild panic attacks  - escitalopram (LEXAPRO) 20 MG  tablet; Take 1 tablet (20 mg total) by mouth daily.  Dispense: 90 tablet; Refill: 1  F/up in 3 months.   Nevada Crane, MD 06/12/2019, 10:04 AM

## 2019-06-13 ENCOUNTER — Other Ambulatory Visit: Payer: Self-pay | Admitting: Physician Assistant

## 2019-06-13 DIAGNOSIS — F331 Major depressive disorder, recurrent, moderate: Secondary | ICD-10-CM

## 2019-06-19 ENCOUNTER — Other Ambulatory Visit: Payer: Self-pay

## 2019-06-23 ENCOUNTER — Telehealth: Payer: Self-pay | Admitting: *Deleted

## 2019-06-23 NOTE — Telephone Encounter (Signed)
Left a voicemail for patient to notify him that it is time to schedule annual low dose lung cancer screening CT scan. Instructed patient to call back to verify information prior to the scan being scheduled.  

## 2019-07-26 DIAGNOSIS — N2581 Secondary hyperparathyroidism of renal origin: Secondary | ICD-10-CM | POA: Insufficient documentation

## 2019-08-15 ENCOUNTER — Encounter: Payer: Self-pay | Admitting: *Deleted

## 2019-08-30 ENCOUNTER — Telehealth: Payer: Self-pay | Admitting: *Deleted

## 2019-08-30 DIAGNOSIS — Z87891 Personal history of nicotine dependence: Secondary | ICD-10-CM

## 2019-08-30 NOTE — Telephone Encounter (Signed)
Patient has been notified that annual lung cancer screening low dose CT scan is due currently or will be in near future. Confirmed that patient is within the age range of 55-77, and asymptomatic, (no signs or symptoms of lung cancer). Patient denies illness that would prevent curative treatment for lung cancer if found. Verified smoking history, (current, 70.5 pack year). The shared decision making visit was done 03/29/18. Patient is agreeable for CT scan being scheduled.

## 2019-09-04 ENCOUNTER — Ambulatory Visit
Admission: RE | Admit: 2019-09-04 | Discharge: 2019-09-04 | Disposition: A | Discharge status: Home / Self Care | Payer: Medicare Other | Source: Ambulatory Visit | Attending: Oncology | Admitting: Oncology

## 2019-09-04 ENCOUNTER — Other Ambulatory Visit: Payer: Self-pay

## 2019-09-04 DIAGNOSIS — Z87891 Personal history of nicotine dependence: Secondary | ICD-10-CM | POA: Insufficient documentation

## 2019-09-04 DIAGNOSIS — F17210 Nicotine dependence, cigarettes, uncomplicated: Secondary | ICD-10-CM | POA: Diagnosis not present

## 2019-09-06 ENCOUNTER — Encounter: Payer: Self-pay | Admitting: *Deleted

## 2019-09-06 ENCOUNTER — Encounter: Payer: Self-pay | Admitting: Psychiatry

## 2019-09-06 ENCOUNTER — Other Ambulatory Visit: Payer: Self-pay

## 2019-09-06 ENCOUNTER — Ambulatory Visit (INDEPENDENT_AMBULATORY_CARE_PROVIDER_SITE_OTHER): Payer: Medicare Other | Admitting: Psychiatry

## 2019-09-06 DIAGNOSIS — F4001 Agoraphobia with panic disorder: Secondary | ICD-10-CM

## 2019-09-06 MED ORDER — ESCITALOPRAM OXALATE 20 MG PO TABS: 20.0000 mg | ORAL_TABLET | Freq: Every day | ORAL | 1 refills | Status: DC

## 2019-09-06 NOTE — Progress Notes (Signed)
[facility] MD OP Progress Note  I connected with  Richard Bean on 09/06/19 by a video enabled telemedicine application and verified that I am speaking with the correct person using two identifiers.   I discussed the limitations of evaluation and management by telemedicine. The patient expressed understanding and agreed to proceed.   09/06/2019 11:26 AM Richard Bean  MRN:  [id number]  Chief Complaint: " I am doing pretty good."  HPI: Patient reported that everything is going well. He is waiting to get his COVID 19 vaccine.  He has scheduled some medication in April and he hopes to get his vaccination for his vacation.  He stated that everything is going well in general and he has no acute issues or concerns at this time.   Visit Diagnosis:    ICD-10-CM   1. Panic disorder with agoraphobia and mild panic attacks  F40.01     Past Psychiatric History: Panic d/o and agoraphobia  Past Medical History:  Past Medical History:  Diagnosis Date  . Anxiety   . Cancer ([place])   . Depression   . GERD (gastroesophageal reflux disease)   . H/O prostate cancer   . Heart murmur   . Panic disorder with agoraphobia     Past Surgical History:  Procedure Laterality Date  . ANKLE SURGERY Right   . PROSTATE SURGERY      Family Psychiatric History: denied  Family History:  Family History  Problem Relation Age of Onset  . COPD Mother   . Prostate cancer Father   . Cancer - Lung Father   . Depression Sister   . Stomach cancer Sister   . Alcohol abuse Brother   . Heart block Brother   . Breast cancer Sister   . Prostate cancer Brother   . Skin cancer Brother     Social History:  Social History   Socioeconomic History  . Marital status: Married    Spouse name: Not on file  . Number of children: Not on file  . Years of education: Not on file  . Highest education level: Not on file  Occupational History  . Not on file  Tobacco Use  . Smoking status: Current Every Day Smoker     Packs/day: 1.50    Years: 47.00    Pack years: 70.50    Types: Cigarettes    Start date: 05/06/1971  . Smokeless tobacco: Never Used  Substance and Sexual Activity  . Alcohol use: Yes    Alcohol/week: 16.0 standard drinks    Types: 6 Cans of beer, 10 Shots of liquor per week  . Drug use: No  . Sexual activity: Yes  Other Topics Concern  . Not on file  Social History Narrative  . Not on file   Social Determinants of Health   Financial Resource Strain:   . Difficulty of Paying Living Expenses: Not on file  Food Insecurity:   . Worried About Charity fundraiser in the Last Year: Not on file  . Ran Out of Food in the Last Year: Not on file  Transportation Needs:   . Lack of Transportation (Medical): Not on file  . Lack of Transportation (Non-Medical): Not on file  Physical Activity:   . Days of Exercise per Week: Not on file  . Minutes of Exercise per Session: Not on file  Stress:   . Feeling of Stress : Not on file  Social Connections:   . Frequency of Communication with Friends and Family: Not  on file  . Frequency of Social Gatherings with Friends and Family: Not on file  . Attends Religious Services: Not on file  . Active Member of Clubs or Organizations: Not on file  . Attends Archivist Meetings: Not on file  . Marital Status: Not on file    Allergies:  Allergies  Allergen Reactions  . Chlorpheniramine-Phenylephrine     Metabolic Disorder Labs: No results found for: HGBA1C, MPG No results found for: PROLACTIN Lab Results  Component Value Date   CHOL 236 (H) 03/09/2019   TRIG 126 03/09/2019   HDL 56 03/09/2019   CHOLHDL 4.2 03/09/2019   LDLCALC 155 (H) 03/09/2019   LDLCALC 169 (H) 10/12/2016   Lab Results  Component Value Date   TSH 1.270 03/09/2019    Therapeutic Level Labs: No results found for: LITHIUM No results found for: VALPROATE No components found for:  CBMZ  Current Medications: Current Outpatient Medications  Medication Sig  Dispense Refill  . amLODipine (NORVASC) 5 MG tablet Take 5 mg by mouth daily.    . enalapril (VASOTEC) 5 MG tablet Take 5 mg by mouth daily.    Marland Kitchen escitalopram (LEXAPRO) 20 MG tablet Take 1 tablet (20 mg total) by mouth daily. 90 tablet 1  . imipramine (TOFRANIL) 25 MG tablet TAKE 1 TABLET AT BEDTIME 90 tablet 3  . sildenafil (REVATIO) 20 MG tablet Take 20 mg by mouth as directed.     No current facility-administered medications for this visit.    Musculoskeletal: Strength & Muscle Tone: unable to assess due to telemed visit Gait & Station: unable to assess due to telemed visit Patient leans: unable to assess due to telemed visit   Psychiatric Specialty Exam: ROS  There were no vitals taken for this visit.There is no height or weight on file to calculate BMI.  General Appearance: Fairly Groomed  Eye Contact:  Good  Speech:  Clear and Coherent and Normal Rate  Volume:  Normal  Mood:  Euthymic  Affect:  Appropriate  Thought Process:  Goal Directed, Linear and Descriptions of Associations: Intact  Orientation:  Full (Time, Place, and Person)  Thought Content: Logical   Suicidal Thoughts:  No  Homicidal Thoughts:  No  Memory:  Recent;   Good Remote;   Good  Judgement:  Fair  Insight:  Fair  Psychomotor Activity:  Normal  Concentration:  Concentration: Good and Attention Span: Good  Recall:  Good  Fund of Knowledge: Good  Language: Good  Akathisia:  Negative  Handed:  Right  AIMS (if indicated): not done  Assets:  Communication Skills Desire for Improvement Financial Resources/Insurance [place] Talents/Skills Transportation  ADL's:  Intact  Cognition: WNL  Sleep:  Fair   Screenings: PHQ2-9     Office Visit from 03/07/2018 in [facility] Visit from 10/12/2016 in [facility] [place] Visit from 06/17/2015 in [facility]  PHQ-2 Total Score  6  0  0  PHQ-9 Total Score  12  --  --       Assessment and  Plan: This is a 69 year old male with history of panic disorder, agoraphobia stable on Lexapro.  1. Panic disorder with agoraphobia and mild panic attacks  - escitalopram (LEXAPRO) 20 MG tablet; Take 1 tablet (20 mg total) by mouth daily.  Dispense: 90 tablet; Refill: 1  F/up in 3 months.   [place] Crane, MD 09/06/2019, 11:26 AM

## 2019-10-06 ENCOUNTER — Ambulatory Visit (INDEPENDENT_AMBULATORY_CARE_PROVIDER_SITE_OTHER): Payer: Medicare Other | Admitting: Family Medicine

## 2019-10-06 ENCOUNTER — Encounter: Payer: Self-pay | Admitting: Family Medicine

## 2019-10-06 ENCOUNTER — Other Ambulatory Visit: Payer: Self-pay

## 2019-10-06 VITALS — BP 148/78 | HR 93 | Temp 97.3°F | Resp 17 | Ht 72.0 in | Wt 236.4 lb

## 2019-10-06 DIAGNOSIS — E782 Mixed hyperlipidemia: Secondary | ICD-10-CM | POA: Diagnosis not present

## 2019-10-06 DIAGNOSIS — N183 Chronic kidney disease, stage 3 unspecified: Secondary | ICD-10-CM

## 2019-10-06 DIAGNOSIS — J441 Chronic obstructive pulmonary disease with (acute) exacerbation: Secondary | ICD-10-CM

## 2019-10-06 MED ORDER — DOXYCYCLINE HYCLATE 100 MG PO TABS: 100.0000 mg | ORAL_TABLET | Freq: Two times a day (BID) | ORAL | 0 refills | Status: DC

## 2019-10-06 NOTE — Progress Notes (Signed)
Patient: Richard Bean Male    DOB: 1950-10-08   69 y.o.   MRN: [id number] Visit Date: 10/06/2019  Today's Provider: Vernie Murders, PA   Discuss CT Chest lung results.  Subjective:     Patient states he wants to discuss the CT Chest lung results from 09/04/2019 CT scan.   Past Medical History:  Diagnosis Date  . Anxiety   . Cancer ([place])   . Depression   . GERD (gastroesophageal reflux disease)   . H/O prostate cancer   . Heart murmur   . Panic disorder with agoraphobia    Past Surgical History:  Procedure Laterality Date  . ANKLE SURGERY Right   . PROSTATE SURGERY     Family History  Problem Relation Age of Onset  . COPD Mother   . Prostate cancer Father   . Cancer - Lung Father   . Depression Sister   . Stomach cancer Sister   . Alcohol abuse Brother   . Heart block Brother   . Breast cancer Sister   . Prostate cancer Brother   . Skin cancer Brother     Allergies  Allergen Reactions  . Chlorpheniramine-Phenylephrine     Current Outpatient Medications:  .  amLODipine (NORVASC) 5 MG tablet, Take 5 mg by mouth daily., Disp: , Rfl:  .  enalapril (VASOTEC) 5 MG tablet, Take 5 mg by mouth daily., Disp: , Rfl:  .  escitalopram (LEXAPRO) 20 MG tablet, Take 1 tablet (20 mg total) by mouth daily., Disp: 90 tablet, Rfl: 1 .  imipramine (TOFRANIL) 25 MG tablet, TAKE 1 TABLET AT BEDTIME, Disp: 90 tablet, Rfl: 3 .  sildenafil (REVATIO) 20 MG tablet, Take 20 mg by mouth as directed., Disp: , Rfl:   Review of Systems  Constitutional: Negative.   HENT: Negative.   Eyes: Negative.   Respiratory: Negative.   Cardiovascular: Negative.   Gastrointestinal: Negative.   Genitourinary: Negative.     Social History   Tobacco Use  . Smoking status: Current Every Day Smoker    Packs/day: 1.50    Years: 47.00    Pack years: 70.50    Types: Cigarettes    Start date: 05/06/1971  . Smokeless tobacco: Never Used  Substance Use Topics  . Alcohol use: Yes   Alcohol/week: 16.0 standard drinks    Types: 6 Cans of beer, 10 Shots of liquor per week      Objective:   BP (!) 148/78 (BP Location: Right Arm, Patient Position: Sitting, Cuff Size: Normal)   Pulse 93   Temp (!) 97.3 F (36.3 C) (Temporal)   Resp 17   Ht 6' (1.829 m)   Wt 236 lb 6.4 oz (107.2 kg)   SpO2 96%   BMI 32.06 kg/m  Vitals:   10/06/19 1409  BP: (!) 148/78  Pulse: 93  Resp: 17  Temp: (!) 97.3 F (36.3 C)  TempSrc: Temporal  SpO2: 96%  Weight: 236 lb 6.4 oz (107.2 kg)  Height: 6' (1.829 m)  Body mass index is 32.06 kg/m.  Physical Exam Constitutional:      General: He is not in acute distress.    Appearance: He is well-developed.  HENT:     Head: Normocephalic and atraumatic.     Right Ear: Hearing normal.     Left Ear: Hearing and tympanic membrane normal.     Ears:     Comments: Large amount of cerumen in the right canal. No erythema or drainage.  Nose: Nose normal.     Mouth/Throat:     Pharynx: Oropharynx is clear.  Eyes:     General: Lids are normal. No scleral icterus.       Right eye: No discharge.        Left eye: No discharge.     Conjunctiva/sclera: Conjunctivae normal.  Cardiovascular:     Rate and Rhythm: Normal rate and regular rhythm.     Heart sounds: Normal heart sounds.  Pulmonary:     Effort: Pulmonary effort is normal. No respiratory distress.  Abdominal:     General: Bowel sounds are normal.     Palpations: Abdomen is soft. There is no mass.     Tenderness: There is no abdominal tenderness. There is no right CVA tenderness, left CVA tenderness, guarding or rebound.  Musculoskeletal:        General: Normal range of motion.     Cervical back: Normal range of motion and neck supple.  Skin:    Findings: No lesion or rash.  Neurological:     Mental Status: He is alert and oriented to person, place, and time.  Psychiatric:        Speech: Speech normal.        Behavior: Behavior normal.        Thought Content: Thought content  normal.       Assessment & Plan    1. COPD with exacerbation [facility]) Qualified for low-dose CT of chest in a current smoker with 71 pack-year history. Report identified benign peribronchovascular nodularity with suspected bronchiolitis, slightly irregular liver margin, left renal stone, right adrenal adenoma and coronary artery calcifications. Patient worried about possible bronchial infection. Will check CBC and start Doxycycline for suspected atypical pathogens. Denies significant cough or congestion. Should work on smoking cessation. - CBC with Differential/Platelet  2. Stage 3 chronic kidney disease, unspecified whether stage 3a or 3b CKD Followed by nephrologist (Dr. [name]) and will get follow up labs. May need nephrology assessment of adrenal adenoma. - Comprehensive metabolic panel  3. Mixed hyperlipidemia Total cholesterol 236, HDL 56 and LDL 155 on 03-09-19. With liver margin changes, could be the onset of cirrhosis from chronic smoking. States he only gets 3-4 ETOH beverages a week. Recommend he restrict fats, decrease ETOH use and may need further GI evaluation pending lab reports. - Comprehensive metabolic panel - Lipid panel     Vernie Murders, PA  [facility]

## 2019-10-16 DIAGNOSIS — N183 Chronic kidney disease, stage 3 unspecified: Secondary | ICD-10-CM | POA: Diagnosis not present

## 2019-10-16 DIAGNOSIS — E782 Mixed hyperlipidemia: Secondary | ICD-10-CM | POA: Diagnosis not present

## 2019-10-16 DIAGNOSIS — J441 Chronic obstructive pulmonary disease with (acute) exacerbation: Secondary | ICD-10-CM | POA: Diagnosis not present

## 2019-10-17 LAB — COMPREHENSIVE METABOLIC PANEL
ALT: 52 IU/L — ABNORMAL HIGH (ref 0–44)
AST: 32 IU/L (ref 0–40)
Albumin/Globulin Ratio: 1.8 (ref 1.2–2.2)
Albumin: 4.3 g/dL (ref 3.8–4.8)
Alkaline Phosphatase: 130 IU/L — ABNORMAL HIGH (ref 39–117)
BUN/Creatinine Ratio: 15 (ref 10–24)
BUN: 24 mg/dL (ref 8–27)
Bilirubin Total: 0.3 mg/dL (ref 0.0–1.2)
CO2: 21 mmol/L (ref 20–29)
Calcium: 9.5 mg/dL (ref 8.6–10.2)
Chloride: 99 mmol/L (ref 96–106)
Creatinine, Ser: 1.64 mg/dL — ABNORMAL HIGH (ref 0.76–1.27)
GFR calc Af Amer: 49 mL/min/{1.73_m2} — ABNORMAL LOW (ref >59)
GFR calc non Af Amer: 42 mL/min/{1.73_m2} — ABNORMAL LOW (ref >59)
Globulin, Total: 2.4 g/dL (ref 1.5–4.5)
Glucose: 100 mg/dL — ABNORMAL HIGH (ref 65–99)
Potassium: 4.4 mmol/L (ref 3.5–5.2)
Sodium: 137 mmol/L (ref 134–144)
Total Protein: 6.7 g/dL (ref 6.0–8.5)

## 2019-10-17 LAB — CBC WITH DIFFERENTIAL/PLATELET
Basophils Absolute: 0.1 10*3/uL (ref 0.0–0.2)
Basos: 1 %
EOS (ABSOLUTE): 0.3 10*3/uL (ref 0.0–0.4)
Eos: 5 %
Hematocrit: 43.9 % (ref 37.5–51.0)
Hemoglobin: 15.2 g/dL (ref 13.0–17.7)
Immature Grans (Abs): 0 10*3/uL (ref 0.0–0.1)
Immature Granulocytes: 1 %
Lymphocytes Absolute: 1.2 10*3/uL (ref 0.7–3.1)
Lymphs: 21 %
MCH: 30.5 pg (ref 26.6–33.0)
MCHC: 34.6 g/dL (ref 31.5–35.7)
MCV: 88 fL (ref 79–97)
Monocytes Absolute: 0.6 10*3/uL (ref 0.1–0.9)
Monocytes: 10 %
Neutrophils Absolute: 3.6 10*3/uL (ref 1.4–7.0)
Neutrophils: 62 %
Platelets: 274 10*3/uL (ref 150–450)
RBC: 4.99 x10E6/uL (ref 4.14–5.80)
RDW: 13.3 % (ref 11.6–15.4)
WBC: 5.8 10*3/uL (ref 3.4–10.8)

## 2019-10-17 LAB — LIPID PANEL
Chol/HDL Ratio: 3.8 ratio (ref 0.0–5.0)
Cholesterol, Total: 211 mg/dL — ABNORMAL HIGH (ref 100–199)
HDL: 55 mg/dL (ref >39)
LDL Chol Calc (NIH): 127 mg/dL — ABNORMAL HIGH (ref 0–99)
Triglycerides: 163 mg/dL — ABNORMAL HIGH (ref 0–149)
VLDL Cholesterol Cal: 29 mg/dL (ref 5–40)

## 2019-10-20 ENCOUNTER — Telehealth: Payer: Self-pay | Admitting: *Deleted

## 2019-10-20 NOTE — Telephone Encounter (Signed)
-----   Message from [place], [place] sent at 10/19/2019  6:39 PM EDT ----- Total cholesterol, Triglycerides and LDL cholesterol still with some elevation. Some liver enzymes are elevated and should consider hepatic ultrasound to rule out fatty liver disease. Creatinine higher and GFR lower indicating worsening of kidney function. Should have this and adrenal adenoma evaluated by his nephrologist (Dr. [name]).

## 2019-10-20 NOTE — Telephone Encounter (Signed)
LMOVM for pt to return call. Okay for PEC triage to give pt results. 

## 2019-10-24 NOTE — Telephone Encounter (Signed)
Patient notified of lab result and PCP recommendation. Patient would like to go ahead with ultrasound- he is out of town 4/7-15. Patient request that lab results for kidney function be forwarded to nephrologist- he has appointment in July.

## 2019-10-24 NOTE — Telephone Encounter (Signed)
Left message to call back okay for PEC triage to advise. KW °

## 2019-10-25 NOTE — Telephone Encounter (Signed)
What ultrasound is he referring to, [name] did not mention an ultrasound. [name] mentioned following up with Dr. [name] for adrenal adenoma. He might need an MRI to further evaluate this. Dr. [name] should be able to see our lab results and should be able to order this testing. I haven't seen this patient since 02/2019.

## 2019-10-25 NOTE — Telephone Encounter (Signed)
Tired calling patient and no answer. Left voice message for patient to return call, if patient calls back ok for PEC to advise patient of message.

## 2019-10-26 NOTE — Telephone Encounter (Signed)
Attempted to contact pt.  Left vm to call office for further information from Provider.

## 2019-10-27 ENCOUNTER — Telehealth: Payer: Self-pay | Admitting: *Deleted

## 2019-10-27 NOTE — Telephone Encounter (Signed)
Pt returned the call and was given the message from [facility].  Pt mentioned,  "Someone called me about this last week".    "I have an appt with Dr. Daralene Milch in July for all of that".

## 2019-11-30 ENCOUNTER — Telehealth (INDEPENDENT_AMBULATORY_CARE_PROVIDER_SITE_OTHER): Payer: Medicare Other | Admitting: Psychiatry

## 2019-11-30 ENCOUNTER — Encounter: Payer: Self-pay | Admitting: Psychiatry

## 2019-11-30 ENCOUNTER — Other Ambulatory Visit: Payer: Self-pay

## 2019-11-30 DIAGNOSIS — F4001 Agoraphobia with panic disorder: Secondary | ICD-10-CM

## 2019-11-30 DIAGNOSIS — F39 Unspecified mood [affective] disorder: Secondary | ICD-10-CM | POA: Insufficient documentation

## 2019-11-30 MED ORDER — ESCITALOPRAM OXALATE 20 MG PO TABS: 20.0000 mg | ORAL_TABLET | Freq: Every day | ORAL | 1 refills | Status: DC

## 2019-11-30 NOTE — Progress Notes (Addendum)
[facility] OP Progress Note  I connected with  Richard Bean on 11/30/19 by a video enabled telemedicine application and verified that I am speaking with the correct person using two identifiers.   I discussed the limitations of evaluation and management by telemedicine. The patient expressed understanding and agreed to proceed.   11/30/2019 11:54 AM Richard Bean  MRN:  [id number]  Chief Complaint: " I am doing well."  HPI: Patient reported that he is doing well. He reports that he has been fully vaccinated for  COVID 19.  He reports that his symptoms are controlled effectively with Lexapro 20 mg. He reports that when the other psychiatrist attempted to reduce the dose in the past he did not do well. He is agreeable to continue the same medication regimen. No other concerns noted at this time.     Visit Diagnosis:    ICD-10-CM   1. Panic disorder with agoraphobia and mild panic attacks  F40.01 escitalopram (LEXAPRO) 20 MG tablet    Past Psychiatric History: Panic d/o and agoraphobia  Past Medical History:  Past Medical History:  Diagnosis Date  . Anxiety   . Cancer ([place])   . Depression   . GERD (gastroesophageal reflux disease)   . H/O prostate cancer   . Heart murmur   . Panic disorder with agoraphobia     Past Surgical History:  Procedure Laterality Date  . ANKLE SURGERY Right   . PROSTATE SURGERY      Family Psychiatric History: denied  Family History:  Family History  Problem Relation Age of Onset  . COPD Mother   . Prostate cancer Father   . Cancer - Lung Father   . Depression Sister   . Stomach cancer Sister   . Alcohol abuse Brother   . Heart block Brother   . Breast cancer Sister   . Prostate cancer Brother   . Skin cancer Brother     Social History:  Social History   Socioeconomic History  . Marital status: Married    Spouse name: Not on file  . Number of children: Not on file  . Years of education: Not on file  . Highest education level: Not on  file  Occupational History  . Not on file  Tobacco Use  . Smoking status: Current Every Day Smoker    Packs/day: 1.50    Years: 47.00    Pack years: 70.50    Types: Cigarettes    Start date: 05/06/1971  . Smokeless tobacco: Never Used  Substance and Sexual Activity  . Alcohol use: Yes    Alcohol/week: 16.0 standard drinks    Types: 6 Cans of beer, 10 Shots of liquor per week  . Drug use: No  . Sexual activity: Yes  Other Topics Concern  . Not on file  Social History Narrative  . Not on file   Social Determinants of Health   Financial Resource Strain:   . Difficulty of Paying Living Expenses:   Food Insecurity:   . Worried About Charity fundraiser in the Last Year:   . Arboriculturist in the Last Year:   Transportation Needs:   . Film/video editor (Medical):   Marland Kitchen Lack of Transportation (Non-Medical):   Physical Activity:   . Days of Exercise per Week:   . Minutes of Exercise per Session:   Stress:   . Feeling of Stress :   Social Connections:   . Frequency of Communication with Friends and  Family:   . Frequency of Social Gatherings with Friends and Family:   . Attends Religious Services:   . Active Member of Clubs or Organizations:   . Attends Archivist Meetings:   Marland Kitchen Marital Status:     Allergies:  Allergies  Allergen Reactions  . Chlorpheniramine-Phenylephrine     Metabolic Disorder Labs: No results found for: HGBA1C, MPG No results found for: PROLACTIN Lab Results  Component Value Date   CHOL 211 (H) 10/16/2019   TRIG 163 (H) 10/16/2019   HDL 55 10/16/2019   CHOLHDL 3.8 10/16/2019   LDLCALC 127 (H) 10/16/2019   LDLCALC 155 (H) 03/09/2019   Lab Results  Component Value Date   TSH 1.270 03/09/2019    Therapeutic Level Labs: No results found for: LITHIUM No results found for: VALPROATE No components found for:  CBMZ  Current Medications: Current Outpatient Medications  Medication Sig Dispense Refill  . amLODipine (NORVASC) 5  MG tablet Take 5 mg by mouth daily.    Marland Kitchen doxycycline (VIBRA-TABS) 100 MG tablet Take 1 tablet (100 mg total) by mouth 2 (two) times daily. 20 tablet 0  . enalapril (VASOTEC) 5 MG tablet Take 5 mg by mouth daily.    Marland Kitchen escitalopram (LEXAPRO) 20 MG tablet Take 1 tablet (20 mg total) by mouth daily. 90 tablet 1  . imipramine (TOFRANIL) 25 MG tablet TAKE 1 TABLET AT BEDTIME 90 tablet 3  . sildenafil (REVATIO) 20 MG tablet Take 20 mg by mouth as directed.     No current facility-administered medications for this visit.    Musculoskeletal: Strength & Muscle Tone: unable to assess due to telemed visit Gait & Station: unable to assess due to telemed visit Patient leans: unable to assess due to telemed visit   Psychiatric Specialty Exam: ROS  There were no vitals taken for this visit.There is no height or weight on file to calculate BMI.  General Appearance: Fairly Groomed  Eye Contact:  Good  Speech:  Clear and Coherent and Normal Rate  Volume:  Normal  Mood:  Euthymic  Affect:  Appropriate  Thought Process:  Goal Directed, Linear and Descriptions of Associations: Intact  Orientation:  Full (Time, Place, and Person)  Thought Content: Logical   Suicidal Thoughts:  No  Homicidal Thoughts:  No  Memory:  Recent;   Good Remote;   Good  Judgement:  Fair  Insight:  Fair  Psychomotor Activity:  Normal  Concentration:  Concentration: Good and Attention Span: Good  Recall:  Good  Fund of Knowledge: Good  Language: Good  Akathisia:  Negative  Handed:  Right  AIMS (if indicated): not done  Assets:  Communication Skills Desire for Improvement Financial Resources/Insurance [place] Talents/Skills Transportation  ADL's:  Intact  Cognition: WNL  Sleep:  Fair   Screenings: PHQ2-9     Office Visit from 03/07/2018 in [facility] Visit from 10/12/2016 in [place] Visit from 06/17/2015 in [facility]  PHQ-2 Total  Score  6  0  0  PHQ-9 Total Score  12  --  --       Assessment and Plan: This is a 69 year old male with history of panic disorder, agoraphobia. He reports that his symptoms are currently stable on Lexapro and is agreeable to continue the same dose.  1. Panic disorder with agoraphobia and mild panic attacks  escitalopram (LEXAPRO) 20 MG tablet; Take 1 tablet (20 mg total) by mouth daily.  Dispense: 90 tablet; Refill:  1  Follow up in 4 months.   Salley Slaughter, NP 11/30/2019, 11:54 AM    I saw and managed this patient with NP B. Ronne Binning today.  [place] Crane, MD 11/30/2019 2:06 PM

## 2019-12-13 ENCOUNTER — Ambulatory Visit (INDEPENDENT_AMBULATORY_CARE_PROVIDER_SITE_OTHER): Payer: Medicare Other | Admitting: Dermatology

## 2019-12-13 ENCOUNTER — Other Ambulatory Visit: Payer: Self-pay

## 2019-12-13 DIAGNOSIS — D1801 Hemangioma of skin and subcutaneous tissue: Secondary | ICD-10-CM

## 2019-12-13 DIAGNOSIS — L814 Other melanin hyperpigmentation: Secondary | ICD-10-CM

## 2019-12-13 DIAGNOSIS — Z1283 Encounter for screening for malignant neoplasm of skin: Secondary | ICD-10-CM

## 2019-12-13 DIAGNOSIS — D229 Melanocytic nevi, unspecified: Secondary | ICD-10-CM | POA: Diagnosis not present

## 2019-12-13 DIAGNOSIS — L578 Other skin changes due to chronic exposure to nonionizing radiation: Secondary | ICD-10-CM | POA: Diagnosis not present

## 2019-12-13 DIAGNOSIS — L819 Disorder of pigmentation, unspecified: Secondary | ICD-10-CM

## 2019-12-13 DIAGNOSIS — L821 Other seborrheic keratosis: Secondary | ICD-10-CM | POA: Diagnosis not present

## 2019-12-13 DIAGNOSIS — L820 Inflamed seborrheic keratosis: Secondary | ICD-10-CM

## 2019-12-13 DIAGNOSIS — Z872 Personal history of diseases of the skin and subcutaneous tissue: Secondary | ICD-10-CM

## 2019-12-13 NOTE — Progress Notes (Signed)
   Follow-Up Visit   Subjective  Richard Bean is a 69 y.o. male who presents for the following: TBSE (no areas of concern) No history of skin cancer. Patient does have a history of Actinic Keratosis. The patient presents for total body skin examination for skin cancer screening and mole check.  The following portions of the chart were reviewed this encounter and updated as appropriate:  Tobacco  Allergies  Meds  Problems  Med Hx  Surg Hx  Fam Hx     Review of Systems:  No other skin or systemic complaints except as noted in HPI or Assessment and Plan.  Objective  Well appearing patient in no apparent distress; mood and affect are within normal limits.  All skin waist up examined.  Objective  Left Interorbital: Erythematous keratotic or waxy stuck-on papule or plaque.    Assessment & Plan  Inflamed seborrheic keratosis Left Infraorbital  Cryotherapy today  Prior to procedure, discussed risks of blister formation, small wound, skin dyspigmentation, or rare scar following cryotherapy.    Destruction of lesion - Left infra orbital Complexity: simple   Destruction method: cryotherapy   Informed consent: discussed and consent obtained   Timeout:  patient name, date of birth, surgical site, and procedure verified Lesion destroyed using liquid nitrogen: Yes   Region frozen until ice ball extended beyond lesion: Yes   Outcome: patient tolerated procedure well with no complications   Post-procedure details: wound care instructions given     Lentigines - Scattered tan macules - Discussed due to sun exposure - Benign, observe - Call for any changes  Seborrheic Keratoses - Stuck-on, waxy, tan-brown papules and plaques  - Discussed benign etiology and prognosis. - Observe - Call for any changes  Melanocytic Nevi - Tan-brown and/or pink-flesh-colored symmetric macules and papules - Benign appearing on exam today - Observation - Call clinic for new or changing  moles - Recommend daily use of broad spectrum spf 30+ sunscreen to sun-exposed areas.   Hemangiomas - Red papules - Discussed benign nature - Observe - Call for any changes  Actinic Damage - diffuse scaly erythematous macules with underlying dyspigmentation - Recommend daily broad spectrum sunscreen SPF 30+ to sun-exposed areas, reapply every 2 hours as needed.  - Call for new or changing lesions.  Acrochordons (Skin Tags) - Fleshy, skin-colored pedunculated papules - Benign appearing.  - Observe. - If desired, they can be removed with an in office procedure that is not covered by insurance. - Please call the clinic if you notice any new or changing lesions.   Skin cancer screening performed today.   Return in about 6 months (around 06/14/2020) for TBSE.  I, Donzetta Kohut, CMA, am acting as scribe for Sarina Ser, MD Documentation: I have reviewed the above documentation for accuracy and completeness, and I agree with the above.  Sarina Ser, MD

## 2019-12-13 NOTE — Patient Instructions (Addendum)
Recommend daily broad spectrum sunscreen SPF 30+ to sun-exposed areas, reapply every 2 hours as needed. Call for new or changing lesions.  Cryotherapy Aftercare  . Wash gently with soap and water everyday.   . Apply Vaseline and Band-Aid daily until healed.  

## 2019-12-17 ENCOUNTER — Encounter: Payer: Self-pay | Admitting: Dermatology

## 2020-01-24 DIAGNOSIS — E871 Hypo-osmolality and hyponatremia: Secondary | ICD-10-CM | POA: Diagnosis not present

## 2020-01-24 DIAGNOSIS — I129 Hypertensive chronic kidney disease with stage 1 through stage 4 chronic kidney disease, or unspecified chronic kidney disease: Secondary | ICD-10-CM | POA: Diagnosis not present

## 2020-01-24 DIAGNOSIS — N2581 Secondary hyperparathyroidism of renal origin: Secondary | ICD-10-CM | POA: Diagnosis not present

## 2020-01-24 DIAGNOSIS — R8290 Unspecified abnormal findings in urine: Secondary | ICD-10-CM | POA: Diagnosis not present

## 2020-01-24 DIAGNOSIS — N1831 Chronic kidney disease, stage 3a: Secondary | ICD-10-CM | POA: Diagnosis not present

## 2020-02-14 ENCOUNTER — Telehealth: Payer: Self-pay | Admitting: Family Medicine

## 2020-02-14 NOTE — Telephone Encounter (Signed)
Patient came by the office stating his "kidney doc" was to send [name] a letter to tell him to refer Richard Bean to a "liver specialist" because liver is not in his realm of treatment.    Richard Bean has not heard anything about a referral to a liver specialist and would like to be referred  Please.  Leave a message on patient's phone if he doesn't answer.

## 2020-02-15 NOTE — Telephone Encounter (Signed)
Patient was really rude, I advised him that he would need to be seen by someone here at the office to determine if referral is needed. Tried to schedule the patient for an appointment and he hung up on me.

## 2020-02-15 NOTE — Telephone Encounter (Signed)
I think he needs an appointment with someone here to determine if that referral needs to be made.

## 2020-02-22 ENCOUNTER — Ambulatory Visit (INDEPENDENT_AMBULATORY_CARE_PROVIDER_SITE_OTHER): Payer: Medicare Other | Admitting: Physician Assistant

## 2020-02-22 ENCOUNTER — Other Ambulatory Visit: Payer: Self-pay

## 2020-02-22 ENCOUNTER — Encounter: Payer: Self-pay | Admitting: Physician Assistant

## 2020-02-22 VITALS — BP 135/73 | HR 86 | Temp 98.7°F | Resp 16 | Wt 228.0 lb

## 2020-02-22 DIAGNOSIS — K7030 Alcoholic cirrhosis of liver without ascites: Secondary | ICD-10-CM | POA: Diagnosis not present

## 2020-02-22 NOTE — Progress Notes (Signed)
Established patient visit   Patient: Richard Bean   DOB: 12/12/1950   69 y.o. Male  MRN: [id number] Visit Date: 02/22/2020  Today's healthcare provider: Mar Daring, PA-C   Chief Complaint  Patient presents with  . Follow-up   Subjective    HPI  Hepatic Cirrhosis:  Patient is here requesting a referral to a liver specialist. He was seen by his Nephrologist Dr. [name] on 02/06/2020 and was advised to follow up with his PCP for Hepatic cirrhosis with negative hepatitis B. These changes were noted on his most recent CT lung cancer screening done in 08/2019. He denies any stomach pain or distension. No yellowing of skin or eyes. Does have history of heavier alcohol intake. Now drinks only intermittently. Does use tyelnol based products but rarely, no more than once a week or so.   Patient Active Problem List   Diagnosis Date Noted  . Episodic mood disorder ([place]) 11/30/2019  . Mixed hyperlipidemia 02/08/2017  . Chronic kidney disease (CKD), stage III (moderate) 10/12/2016  . Tobacco use 10/12/2016  . DDD (degenerative disc disease), cervical 10/12/2016  . Hypertension, renal disease, stage 1-4 or unspecified chronic kidney disease 10/12/2016  . Depression, major, recurrent, moderate ([place]) 01/31/2015  . Panic disorder with agoraphobia and mild panic attacks 01/31/2015  . ED (erectile dysfunction) of organic origin 06/08/2013  . Incomplete bladder emptying 06/08/2013  . H/O malignant neoplasm of prostate 06/08/2013  . Genuine stress incontinence, male 06/08/2013   Past Medical History:  Diagnosis Date  . Anxiety   . Cancer ([place])   . Depression   . GERD (gastroesophageal reflux disease)   . H/O prostate cancer   . Heart murmur   . Panic disorder with agoraphobia        Medications: Outpatient Medications Prior to Visit  Medication Sig  . amLODipine (NORVASC) 5 MG tablet Take 5 mg by mouth daily.  . enalapril (VASOTEC) 5 MG tablet Take 5 mg by mouth daily.    Marland Kitchen escitalopram (LEXAPRO) 20 MG tablet Take 1 tablet (20 mg total) by mouth daily.  Marland Kitchen imipramine (TOFRANIL) 25 MG tablet TAKE 1 TABLET AT BEDTIME  . sildenafil (REVATIO) 20 MG tablet Take 20 mg by mouth as directed.  . [DISCONTINUED] doxycycline (VIBRA-TABS) 100 MG tablet Take 1 tablet (100 mg total) by mouth 2 (two) times daily. (Patient not taking: Reported on 02/22/2020)   No facility-administered medications prior to visit.    Review of Systems  Constitutional: Negative for appetite change, chills and fever.  Respiratory: Negative for chest tightness, shortness of breath and wheezing.   Cardiovascular: Negative for chest pain and palpitations.  Gastrointestinal: Negative for abdominal distention, abdominal pain, constipation, diarrhea, nausea and vomiting.    Last CBC Lab Results  Component Value Date   WBC 5.8 10/16/2019   HGB 15.2 10/16/2019   HCT 43.9 10/16/2019   MCV 88 10/16/2019   MCH 30.5 10/16/2019   RDW 13.3 10/16/2019   PLT 274 51/88/4166   Last metabolic panel Lab Results  Component Value Date   GLUCOSE 100 (H) 10/16/2019   NA 137 10/16/2019   K 4.4 10/16/2019   CL 99 10/16/2019   CO2 21 10/16/2019   BUN 24 10/16/2019   CREATININE 1.64 (H) 10/16/2019   GFRNONAA 42 (L) 10/16/2019   GFRAA 49 (L) 10/16/2019   CALCIUM 9.5 10/16/2019   PHOS 3.2 01/24/2015   PROT 6.7 10/16/2019   ALBUMIN 4.3 10/16/2019   LABGLOB 2.4 10/16/2019  AGRATIO 1.8 10/16/2019   BILITOT 0.3 10/16/2019   ALKPHOS 130 (H) 10/16/2019   AST 32 10/16/2019   ALT 52 (H) 10/16/2019      Objective    BP (!) 135/73 (BP Location: Right Arm, Cuff Size: Large)   Pulse 86   Temp 98.7 F (37.1 C) (Oral)   Resp 16   Wt (!) 228 lb (103.4 kg)   BMI 30.92 kg/m  BP Readings from Last 3 Encounters:  02/22/20 (!) 135/73  10/06/19 (!) 148/78  05/29/19 136/77   Wt Readings from Last 3 Encounters:  02/22/20 (!) 228 lb (103.4 kg)  10/06/19 236 lb 6.4 oz (107.2 kg)  09/04/19 223 lb (101.2 kg)       Physical Exam Vitals reviewed.  Constitutional:      General: He is not in acute distress.    Appearance: Normal appearance. He is well-developed. He is obese. He is not ill-appearing or diaphoretic.  HENT:     Head: Normocephalic and atraumatic.  Cardiovascular:     Rate and Rhythm: Normal rate and regular rhythm.     Heart sounds: Normal heart sounds. No murmur heard.  No friction rub. No gallop.   Pulmonary:     Effort: Pulmonary effort is normal. No respiratory distress.     Breath sounds: Normal breath sounds. No wheezing or rales.  Abdominal:     General: Bowel sounds are normal. There is distension.     Palpations: Abdomen is soft. There is no mass.     Tenderness: There is no abdominal tenderness. There is no guarding.  Musculoskeletal:     Cervical back: Normal range of motion and neck supple.  Neurological:     Mental Status: He is alert.       No results found for any visits on 02/22/20.  Assessment & Plan     1. Alcoholic cirrhosis of liver without ascites [facility]) Referral placed for patient as below. No symptoms currently. Incidentally noted on CT lung cancer screen. Most recent liver enzymes were unremarkable. Discussed limiting fatty foods, red meats, processed meats. Discussed limiting tylenol based products. Discussed stopping all alcohol.  - Ambulatory referral to Gastroenterology   No follow-ups on file.      Reynolds Bowl, PA-C, have reviewed all documentation for this visit. The documentation on 02/27/20 for the exam, diagnosis, procedures, and orders are all accurate and complete.   Rubye Beach  [facility] [phone] (phone) [phone] (fax)  [place]

## 2020-02-27 ENCOUNTER — Encounter: Payer: Self-pay | Admitting: Physician Assistant

## 2020-02-27 NOTE — Patient Instructions (Signed)
Fatty Liver Disease  Fatty liver disease occurs when too much fat has built up in your liver cells. Fatty liver disease is also called hepatic steatosis or steatohepatitis. The liver removes harmful substances from your bloodstream and produces fluids that your body needs. It also helps your body use and store energy from the food you eat. In many cases, fatty liver disease does not cause symptoms or problems. It is often diagnosed when tests are being done for other reasons. However, over time, fatty liver can cause inflammation that may lead to more serious liver problems, such as scarring of the liver (cirrhosis) and liver failure. Fatty liver is associated with insulin resistance, increased body fat, high blood pressure (hypertension), and high cholesterol. These are features of metabolic syndrome and increase your risk for stroke, diabetes, and heart disease. What are the causes? This condition may be caused by:  Drinking too much alcohol.  Poor nutrition.  Obesity.  Cushing's syndrome.  Diabetes.  High cholesterol.  Certain drugs.  Poisons.  Some viral infections.  Pregnancy. What increases the risk? You are more likely to develop this condition if you:  Abuse alcohol.  Are overweight.  Have diabetes.  Have hepatitis.  Have a high triglyceride level.  Are pregnant. What are the signs or symptoms? Fatty liver disease often does not cause symptoms. If symptoms do develop, they can include:  Fatigue.  Weakness.  Weight loss.  Confusion.  Abdominal pain.  Nausea and vomiting.  Yellowing of your skin and the white parts of your eyes (jaundice).  Itchy skin. How is this diagnosed? This condition may be diagnosed by:  A physical exam and medical history.  Blood tests.  Imaging tests, such as an ultrasound, CT scan, or MRI.  A liver biopsy. A small sample of liver tissue is removed using a needle. The sample is then looked at under a microscope. How  is this treated? Fatty liver disease is often caused by other health conditions. Treatment for fatty liver may involve medicines and lifestyle changes to manage conditions such as:  Alcoholism.  High cholesterol.  Diabetes.  Being overweight or obese. Follow these instructions at home:   Do not drink alcohol. If you have trouble quitting, ask your health care provider how to safely quit with the help of medicine or a supervised program. This is important to keep your condition from getting worse.  Eat a healthy diet as told by your health care provider. Ask your health care provider about working with a diet and nutrition specialist (dietitian) to develop an eating plan.  Exercise regularly. This can help you lose weight and control your cholesterol and diabetes. Talk to your health care provider about an exercise plan and which activities are best for you.  Take over-the-counter and prescription medicines only as told by your health care provider.  Keep all follow-up visits as told by your health care provider. This is important. Contact a health care provider if: You have trouble controlling your:  Blood sugar. This is especially important if you have diabetes.  Cholesterol.  Drinking of alcohol. Get help right away if:  You have abdominal pain.  You have jaundice.  You have nausea and vomiting.  You vomit blood or material that looks like coffee grounds.  You have stools that are black, tar-like, or bloody. Summary  Fatty liver disease develops when too much fat builds up in the cells of your liver.  Fatty liver disease often causes no symptoms or problems. However, over   time, fatty liver can cause inflammation that may lead to more serious liver problems, such as scarring of the liver (cirrhosis).  You are more likely to develop this condition if you abuse alcohol, are pregnant, are overweight, have diabetes, have hepatitis, or have high triglyceride  levels.  Contact your health care provider if you have trouble controlling your weight, blood sugar, cholesterol, or drinking of alcohol. This information is not intended to replace advice given to you by your health care provider. Make sure you discuss any questions you have with your health care provider. Document Revised: 06/25/2017 Document Reviewed: 04/21/2017 Elsevier Patient Education  2020 [employer]  

## 2020-03-11 ENCOUNTER — Encounter: Payer: Self-pay | Admitting: Family Medicine

## 2020-03-22 ENCOUNTER — Telehealth (HOSPITAL_COMMUNITY): Payer: Medicare Other | Admitting: Psychiatry

## 2020-03-22 ENCOUNTER — Telehealth: Payer: Medicare Other | Admitting: Psychiatry

## 2020-04-04 ENCOUNTER — Other Ambulatory Visit: Payer: Self-pay

## 2020-04-04 ENCOUNTER — Ambulatory Visit (INDEPENDENT_AMBULATORY_CARE_PROVIDER_SITE_OTHER): Payer: Medicare Other | Admitting: Physician Assistant

## 2020-04-04 ENCOUNTER — Encounter: Payer: Self-pay | Admitting: Physician Assistant

## 2020-04-04 VITALS — BP 120/75 | HR 79 | Temp 98.2°F | Resp 16 | Wt 222.0 lb

## 2020-04-04 DIAGNOSIS — Z23 Encounter for immunization: Secondary | ICD-10-CM

## 2020-04-04 DIAGNOSIS — H6121 Impacted cerumen, right ear: Secondary | ICD-10-CM

## 2020-04-04 DIAGNOSIS — Z6830 Body mass index (BMI) 30.0-30.9, adult: Secondary | ICD-10-CM | POA: Diagnosis not present

## 2020-04-04 DIAGNOSIS — Z0000 Encounter for general adult medical examination without abnormal findings: Secondary | ICD-10-CM

## 2020-04-04 DIAGNOSIS — I151 Hypertension secondary to other renal disorders: Secondary | ICD-10-CM | POA: Diagnosis not present

## 2020-04-04 DIAGNOSIS — F5101 Primary insomnia: Secondary | ICD-10-CM | POA: Diagnosis not present

## 2020-04-04 DIAGNOSIS — K7030 Alcoholic cirrhosis of liver without ascites: Secondary | ICD-10-CM

## 2020-04-04 DIAGNOSIS — E782 Mixed hyperlipidemia: Secondary | ICD-10-CM | POA: Diagnosis not present

## 2020-04-04 DIAGNOSIS — N2889 Other specified disorders of kidney and ureter: Secondary | ICD-10-CM | POA: Diagnosis not present

## 2020-04-04 DIAGNOSIS — N183 Chronic kidney disease, stage 3 unspecified: Secondary | ICD-10-CM

## 2020-04-04 DIAGNOSIS — E6609 Other obesity due to excess calories: Secondary | ICD-10-CM

## 2020-04-04 MED ORDER — ESZOPICLONE 1 MG PO TABS: 1.0000 mg | ORAL_TABLET | Freq: Every evening | ORAL | 0 refills | Status: DC | PRN

## 2020-04-04 NOTE — Progress Notes (Signed)
Annual Wellness Visit     Patient: Richard Bean, Male    DOB: 12-13-1950, 69 y.o.   MRN: [id number] Visit Date: 04/04/2020  Today's Provider: Mar Daring, PA-C   Chief Complaint  Patient presents with  . Medicare Wellness   Subjective    Richard Bean is a 69 y.o. male who presents today for his Annual Wellness Visit. He reports consuming a general diet. The patient does not participate in regular exercise at present. He generally feels fairly well. He reports sleeping poorly. He does not have additional problems to discuss today.   HPI   Patient Active Problem List   Diagnosis Date Noted  . Episodic mood disorder ([place]) 11/30/2019  . Mixed hyperlipidemia 02/08/2017  . Chronic kidney disease (CKD), stage III (moderate) 10/12/2016  . Tobacco use 10/12/2016  . DDD (degenerative disc disease), cervical 10/12/2016  . Hypertension, renal disease, stage 1-4 or unspecified chronic kidney disease 10/12/2016  . Depression, major, recurrent, moderate ([place]) 01/31/2015  . Panic disorder with agoraphobia and mild panic attacks 01/31/2015  . ED (erectile dysfunction) of organic origin 06/08/2013  . Incomplete bladder emptying 06/08/2013  . H/O malignant neoplasm of prostate 06/08/2013  . Genuine stress incontinence, male 06/08/2013   Past Medical History:  Diagnosis Date  . Anxiety   . Cancer ([place])   . Depression   . GERD (gastroesophageal reflux disease)   . H/O prostate cancer   . Heart murmur   . Panic disorder with agoraphobia        Medications: Outpatient Medications Prior to Visit  Medication Sig  . amLODipine (NORVASC) 5 MG tablet Take 5 mg by mouth daily.  . enalapril (VASOTEC) 5 MG tablet Take 5 mg by mouth daily.  Marland Kitchen escitalopram (LEXAPRO) 20 MG tablet Take 1 tablet (20 mg total) by mouth daily.  Marland Kitchen imipramine (TOFRANIL) 25 MG tablet TAKE 1 TABLET AT BEDTIME  . sildenafil (REVATIO) 20 MG tablet Take 20 mg by mouth as directed.   No  facility-administered medications prior to visit.    Allergies  Allergen Reactions  . Chlorpheniramine-Phenylephrine     Patient Care Team: Mar Daring, PA-C as PCP - General (Family Medicine) Minna Merritts, MD as Consulting Physician (Cardiology)  Review of Systems  Constitutional: Negative for appetite change, chills, fatigue and fever.  HENT: Positive for tinnitus. Negative for congestion, ear pain, hearing loss, nosebleeds and trouble swallowing.   Eyes: Negative for pain and visual disturbance.  Respiratory: Positive for cough and shortness of breath. Negative for chest tightness.   Cardiovascular: Negative for chest pain, palpitations and leg swelling.  Gastrointestinal: Negative for abdominal pain, blood in stool, constipation, diarrhea, nausea and vomiting.  Endocrine: Negative for polydipsia, polyphagia and polyuria.  Genitourinary: Negative for dysuria and flank pain.  Musculoskeletal: Positive for neck pain and neck stiffness. Negative for arthralgias, back pain, joint swelling and myalgias.  Skin: Negative for color change, rash and wound.  Allergic/Immunologic: Negative.   Neurological: Positive for light-headedness. Negative for dizziness, tremors, seizures, speech difficulty, weakness and headaches.  Psychiatric/Behavioral: Positive for sleep disturbance. Negative for behavioral problems, confusion, decreased concentration and dysphoric mood. The patient is not nervous/anxious.     Last CBC Lab Results  Component Value Date   WBC 5.8 10/16/2019   HGB 15.2 10/16/2019   HCT 43.9 10/16/2019   MCV 88 10/16/2019   MCH 30.5 10/16/2019   RDW 13.3 10/16/2019   PLT 274 50/35/4656   Last metabolic panel Lab  Results  Component Value Date   GLUCOSE 100 (H) 10/16/2019   NA 137 10/16/2019   K 4.4 10/16/2019   CL 99 10/16/2019   CO2 21 10/16/2019   BUN 24 10/16/2019   CREATININE 1.64 (H) 10/16/2019   GFRNONAA 42 (L) 10/16/2019   GFRAA 49 (L) 10/16/2019    CALCIUM 9.5 10/16/2019   PHOS 3.2 01/24/2015   PROT 6.7 10/16/2019   ALBUMIN 4.3 10/16/2019   LABGLOB 2.4 10/16/2019   AGRATIO 1.8 10/16/2019   BILITOT 0.3 10/16/2019   ALKPHOS 130 (H) 10/16/2019   AST 32 10/16/2019   ALT 52 (H) 10/16/2019      Objective    Vitals: BP 120/75 (BP Location: Left Arm, Patient Position: Sitting, Cuff Size: Large)   Pulse 79   Temp 98.2 F (36.8 C) (Oral)   Resp 16   Wt 222 lb (100.7 kg)   BMI 30.11 kg/m  BP Readings from Last 3 Encounters:  04/04/20 120/75  02/22/20 (!) 135/73  10/06/19 (!) 148/78   Wt Readings from Last 3 Encounters:  04/04/20 222 lb (100.7 kg)  02/22/20 (!) 228 lb (103.4 kg)  10/06/19 236 lb 6.4 oz (107.2 kg)      Physical Exam Vitals reviewed.  Constitutional:      General: He is not in acute distress.    Appearance: Normal appearance. He is well-developed. He is obese. He is not ill-appearing.  HENT:     Head: Normocephalic and atraumatic.     Right Ear: Tympanic membrane, ear canal and external ear normal.     Left Ear: Tympanic membrane, ear canal and external ear normal.     Nose: Nose normal.     Mouth/Throat:     Mouth: Mucous membranes are moist.     Pharynx: Oropharynx is clear.  Eyes:     General:        Right eye: No discharge.        Left eye: No discharge.     Extraocular Movements: Extraocular movements intact.     Conjunctiva/sclera: Conjunctivae normal.     Pupils: Pupils are equal, round, and reactive to light.  Neck:     Thyroid: No thyromegaly.     Vascular: No carotid bruit.     Trachea: No tracheal deviation.  Cardiovascular:     Rate and Rhythm: Normal rate and regular rhythm.     Pulses: Normal pulses.     Heart sounds: Normal heart sounds. No murmur heard.   Pulmonary:     Effort: Pulmonary effort is normal. No respiratory distress.     Breath sounds: Normal breath sounds. No wheezing or rales.  Chest:     Chest wall: No tenderness.  Abdominal:     General: Abdomen is flat.  There is no distension.     Palpations: Abdomen is soft. There is no mass.     Tenderness: There is no abdominal tenderness. There is no guarding or rebound.  Musculoskeletal:        General: No tenderness. Normal range of motion.     Cervical back: Normal range of motion and neck supple.     Right lower leg: No edema.     Left lower leg: No edema.  Lymphadenopathy:     Cervical: No cervical adenopathy.  Skin:    General: Skin is warm and dry.     Capillary Refill: Capillary refill takes less than 2 seconds.     Findings: No erythema or rash.  Neurological:  General: No focal deficit present.     Mental Status: He is alert and oriented to person, place, and time. Mental status is at baseline.     Cranial Nerves: No cranial nerve deficit.     Motor: No abnormal muscle tone.     Coordination: Coordination normal.     Deep Tendon Reflexes: Reflexes are normal and symmetric. Reflexes normal.  Psychiatric:        Mood and Affect: Mood normal.        Behavior: Behavior normal.        Thought Content: Thought content normal.        Judgment: Judgment normal.      Most recent functional status assessment: In your present state of health, do you have any difficulty performing the following activities: 04/04/2020  Hearing? N  Vision? N  Difficulty concentrating or making decisions? N  Walking or climbing stairs? N  Dressing or bathing? N  Doing errands, shopping? N  Some recent data might be hidden   Most recent fall risk assessment: Fall Risk  06/19/2019  Falls in the past year? 0  Comment Emmi Telephone Survey: data to providers prior to load  Number falls in past yr: -  Injury with Fall? -    Most recent depression screenings: PHQ 2/9 Scores 04/04/2020 03/09/2019  PHQ - 2 Score 2 -  PHQ- 9 Score 7 -  Exception Documentation - Other- indicate reason in comment box  Not completed - Sees psychiatry   Most recent cognitive screening: 6CIT Screen 04/04/2020  What Year? 0  points  What month? 0 points  What time? 0 points  Count back from 20 0 points  Months in reverse 0 points  Repeat phrase 2 points  Total Score 2   Most recent Audit-C alcohol use screening Alcohol Use Disorder Test (AUDIT) 04/04/2020  1. How often do you have a drink containing alcohol? 2  2. How many drinks containing alcohol do you have on a typical day when you are drinking? 1  3. How often do you have six or more drinks on one occasion? 1  AUDIT-C Score 4  4. How often during the last year have you found that you were not able to stop drinking once you had started? 0  5. How often during the last year have you failed to do what was normally expected from you because of drinking? 0  6. How often during the last year have you needed a first drink in the morning to get yourself going after a heavy drinking session? 0  8. How often during the last year have you been unable to remember what happened the night before because you had been drinking? 0  9. Have you or someone else been injured as a result of your drinking? 0  10. Has a relative or friend or a doctor or another health worker been concerned about your drinking or suggested you cut down? 0  Alcohol Use Disorder Identification Test Final Score (AUDIT) 4  Alcohol Brief Interventions/Follow-up AUDIT Score <7 follow-up not indicated   A score of 3 or more in women, and 4 or more in men indicates increased risk for alcohol abuse, EXCEPT if all of the points are from question 1   No results found for any visits on 04/04/20.  Assessment & Plan     Annual wellness visit done today including the all of the following: Reviewed patient's Family Medical History Reviewed and updated list of patient's medical  providers Assessment of cognitive impairment was done Assessed patient's functional ability Established a written schedule for health screening [place] Completed and Reviewed  Exercise Activities and Dietary  recommendations Goals   None     Immunization History  Administered Date(s) Administered  . Fluad Quad(high Dose 65+) 04/04/2020  . Influenza, High Dose Seasonal PF 06/04/2016, 05/05/2018  . Influenza,inj,Quad PF,6+ Mos 06/17/2015  . PFIZER SARS-COV-2 Vaccination 09/20/2019, 10/11/2019  . Pneumococcal Conjugate-13 10/12/2016  . Pneumococcal Polysaccharide-23 03/09/2019  . Zoster 05/24/2014    Health Maintenance  Topic Date Due  . TETANUS/TDAP  Never done  . COLONOSCOPY  10/23/2022  . INFLUENZA VACCINE  Completed  . COVID-19 Vaccine  Completed  . Hepatitis C Screening  Completed  . PNA vac Low Risk Adult  Completed     Discussed health benefits of physical activity, and encouraged him to engage in regular exercise appropriate for his age and condition.    1. Annual physical exam Normal physical exam today. Will check labs as below and f/u pending lab results. If labs are stable and WNL he will not need to have these rechecked for one year at his next annual physical exam. He is to call the office in the meantime if he has any acute issue, questions or concerns.  2. Primary insomnia Worsening. Will try Lunesta as below. F/U in 4 weeks.  - eszopiclone (LUNESTA) 1 MG TABS tablet; Take 1 tablet (1 mg total) by mouth at bedtime as needed for sleep. Take immediately before bedtime  Dispense: 30 tablet; Refill: 0  3. Impacted cerumen of right ear Ear lavage successful.  - Ear Lavage  4. Stage 3 chronic kidney disease, unspecified whether stage 3a or 3b CKD Will check labs as below and f/u pending results. - CBC w/Diff/Platelet - Comprehensive Metabolic Panel (CMET)  5. Hypertension secondary to other renal disorders Stable. Continue medications. Will check labs as below and f/u pending results. - CBC w/Diff/Platelet - Comprehensive Metabolic Panel (CMET) - Lipid Panel With LDL/HDL Ratio  6. Mixed hyperlipidemia Diet controlled. Will check labs as below and f/u pending  results. - CBC w/Diff/Platelet - Comprehensive Metabolic Panel (CMET) - Lipid Panel With LDL/HDL Ratio  7. Alcoholic cirrhosis of liver without ascites (HCC) Known. Followed by GI.  - CBC w/Diff/Platelet - Comprehensive Metabolic Panel (CMET) - Lipid Panel With LDL/HDL Ratio - HgB A1c  8. Class 1 obesity due to excess calories with serious comorbidity and body mass index (BMI) of 30.0 to 30.9 in adult Counseled patient on healthy lifestyle modifications including dieting and exercise.  - CBC w/Diff/Platelet - Comprehensive Metabolic Panel (CMET) - Lipid Panel With LDL/HDL Ratio - HgB A1c  9. Need for influenza vaccination Flu vaccine given today without complication. Patient sat upright for 15 minutes to check for adverse reaction before being released. - Flu Vaccine QUAD High Dose IM (Fluad)   No follow-ups on file.     Reynolds Bowl, PA-C, have reviewed all documentation for this visit. The documentation on 04/11/20 for the exam, diagnosis, procedures, and orders are all accurate and complete.   Rubye Beach  [facility] [phone] (phone) [phone] (fax)  [place]

## 2020-04-04 NOTE — Patient Instructions (Signed)
Health Maintenance After Age 70 After age 70, you are at a higher risk for certain long-term diseases and infections as well as injuries from falls. Falls are a major cause of broken bones and head injuries in people who are older than age 70. Getting regular preventive care can help to keep you healthy and well. Preventive care includes getting regular testing and making lifestyle changes as recommended by your health care provider. Talk with your health care provider about:  Which screenings and tests you should have. A screening is a test that checks for a disease when you have no symptoms.  A diet and exercise plan that is right for you. What should I know about screenings and tests to prevent falls? Screening and testing are the best ways to find a health problem early. Early diagnosis and treatment give you the best chance of managing medical conditions that are common after age 70. Certain conditions and lifestyle choices may make you more likely to have a fall. Your health care provider may recommend:  Regular vision checks. Poor vision and conditions such as cataracts can make you more likely to have a fall. If you wear glasses, make sure to get your prescription updated if your vision changes.  Medicine review. Work with your health care provider to regularly review all of the medicines you are taking, including over-the-counter medicines. Ask your health care provider about any side effects that may make you more likely to have a fall. Tell your health care provider if any medicines that you take make you feel dizzy or sleepy.  Osteoporosis screening. Osteoporosis is a condition that causes the bones to get weaker. This can make the bones weak and cause them to break more easily.  Blood pressure screening. Blood pressure changes and medicines to control blood pressure can make you feel dizzy.  Strength and balance checks. Your health care provider may recommend certain tests to check your  strength and balance while standing, walking, or changing positions.  Foot health exam. Foot pain and numbness, as well as not wearing proper footwear, can make you more likely to have a fall.  Depression screening. You may be more likely to have a fall if you have a fear of falling, feel emotionally low, or feel unable to do activities that you used to do.  Alcohol use screening. Using too much alcohol can affect your balance and may make you more likely to have a fall. What actions can I take to lower my risk of falls? General instructions  Talk with your health care provider about your risks for falling. Tell your health care provider if: ? You fall. Be sure to tell your health care provider about all falls, even ones that seem minor. ? You feel dizzy, sleepy, or off-balance.  Take over-the-counter and prescription medicines only as told by your health care provider. These include any supplements.  Eat a healthy diet and maintain a healthy weight. A healthy diet includes low-fat dairy products, low-fat (lean) meats, and fiber from whole grains, beans, and lots of fruits and vegetables. Home safety  Remove any tripping hazards, such as rugs, cords, and clutter.  Install safety equipment such as grab bars in bathrooms and safety rails on stairs.  Keep rooms and walkways well-lit. Activity   Follow a regular exercise program to stay fit. This will help you maintain your balance. Ask your health care provider what types of exercise are appropriate for you.  If you need a cane or   walker, use it as recommended by your health care provider.  Wear supportive shoes that have nonskid soles. Lifestyle  Do not drink alcohol if your health care provider tells you not to drink.  If you drink alcohol, limit how much you have: ? 0-1 drink a day for women. ? 0-2 drinks a day for men.  Be aware of how much alcohol is in your drink. In the [place] one drink equals one typical bottle of beer (12  oz), one-half glass of wine (5 oz), or one shot of hard liquor (1 oz).  Do not use any products that contain nicotine or tobacco, such as cigarettes and e-cigarettes. If you need help quitting, ask your health care provider. Summary  Having a healthy lifestyle and getting preventive care can help to protect your health and wellness after age 70.  Screening and testing are the best way to find a health problem early and help you avoid having a fall. Early diagnosis and treatment give you the best chance for managing medical conditions that are more common for people who are older than age 70.  Falls are a major cause of broken bones and head injuries in people who are older than age 70. Take precautions to prevent a fall at home.  Work with your health care provider to learn what changes you can make to improve your health and wellness and to prevent falls. This information is not intended to replace advice given to you by your health care provider. Make sure you discuss any questions you have with your health care provider. Document Revised: 11/03/2018 Document Reviewed: 05/26/2017 Elsevier Patient Education  2020 [employer]  

## 2020-04-11 ENCOUNTER — Encounter: Payer: Self-pay | Admitting: Physician Assistant

## 2020-05-03 DIAGNOSIS — R7309 Other abnormal glucose: Secondary | ICD-10-CM | POA: Diagnosis not present

## 2020-05-03 DIAGNOSIS — I151 Hypertension secondary to other renal disorders: Secondary | ICD-10-CM | POA: Diagnosis not present

## 2020-05-03 DIAGNOSIS — E6609 Other obesity due to excess calories: Secondary | ICD-10-CM | POA: Diagnosis not present

## 2020-05-03 DIAGNOSIS — E782 Mixed hyperlipidemia: Secondary | ICD-10-CM | POA: Diagnosis not present

## 2020-05-03 DIAGNOSIS — N183 Chronic kidney disease, stage 3 unspecified: Secondary | ICD-10-CM | POA: Diagnosis not present

## 2020-05-03 DIAGNOSIS — K7030 Alcoholic cirrhosis of liver without ascites: Secondary | ICD-10-CM | POA: Diagnosis not present

## 2020-05-03 DIAGNOSIS — Z6830 Body mass index (BMI) 30.0-30.9, adult: Secondary | ICD-10-CM | POA: Diagnosis not present

## 2020-05-03 DIAGNOSIS — N2889 Other specified disorders of kidney and ureter: Secondary | ICD-10-CM | POA: Diagnosis not present

## 2020-05-04 LAB — CBC WITH DIFFERENTIAL/PLATELET
Basophils Absolute: 0 10*3/uL (ref 0.0–0.2)
Basos: 1 %
EOS (ABSOLUTE): 0.1 10*3/uL (ref 0.0–0.4)
Eos: 1 %
Hematocrit: 43 % (ref 37.5–51.0)
Hemoglobin: 15.7 g/dL (ref 13.0–17.7)
Immature Grans (Abs): 0.1 10*3/uL (ref 0.0–0.1)
Immature Granulocytes: 1 %
Lymphocytes Absolute: 1.3 10*3/uL (ref 0.7–3.1)
Lymphs: 17 %
MCH: 31.9 pg (ref 26.6–33.0)
MCHC: 36.5 g/dL — ABNORMAL HIGH (ref 31.5–35.7)
MCV: 87 fL (ref 79–97)
Monocytes Absolute: 0.8 10*3/uL (ref 0.1–0.9)
Monocytes: 10 %
Neutrophils Absolute: 5.3 10*3/uL (ref 1.4–7.0)
Neutrophils: 70 %
Platelets: 266 10*3/uL (ref 150–450)
RBC: 4.92 x10E6/uL (ref 4.14–5.80)
RDW: 12.3 % (ref 11.6–15.4)
WBC: 7.5 10*3/uL (ref 3.4–10.8)

## 2020-05-04 LAB — COMPREHENSIVE METABOLIC PANEL
ALT: 17 IU/L (ref 0–44)
AST: 23 IU/L (ref 0–40)
Albumin/Globulin Ratio: 1.6 (ref 1.2–2.2)
Albumin: 4.4 g/dL (ref 3.8–4.8)
Alkaline Phosphatase: 128 IU/L — ABNORMAL HIGH (ref 44–121)
BUN/Creatinine Ratio: 15 (ref 10–24)
BUN: 28 mg/dL — ABNORMAL HIGH (ref 8–27)
Bilirubin Total: 0.4 mg/dL (ref 0.0–1.2)
CO2: 20 mmol/L (ref 20–29)
Calcium: 9.4 mg/dL (ref 8.6–10.2)
Chloride: 100 mmol/L (ref 96–106)
Creatinine, Ser: 1.81 mg/dL — ABNORMAL HIGH (ref 0.76–1.27)
GFR calc Af Amer: 43 mL/min/{1.73_m2} — ABNORMAL LOW (ref >59)
GFR calc non Af Amer: 37 mL/min/{1.73_m2} — ABNORMAL LOW (ref >59)
Globulin, Total: 2.7 g/dL (ref 1.5–4.5)
Glucose: 96 mg/dL (ref 65–99)
Potassium: 5.2 mmol/L (ref 3.5–5.2)
Sodium: 137 mmol/L (ref 134–144)
Total Protein: 7.1 g/dL (ref 6.0–8.5)

## 2020-05-04 LAB — HEMOGLOBIN A1C
Est. average glucose Bld gHb Est-mCnc: 108 mg/dL
Hgb A1c MFr Bld: 5.4 % (ref 4.8–5.6)

## 2020-05-04 LAB — LIPID PANEL WITH LDL/HDL RATIO
Cholesterol, Total: 223 mg/dL — ABNORMAL HIGH (ref 100–199)
HDL: 61 mg/dL (ref >39)
LDL Chol Calc (NIH): 147 mg/dL — ABNORMAL HIGH (ref 0–99)
LDL/HDL Ratio: 2.4 ratio (ref 0.0–3.6)
Triglycerides: 88 mg/dL (ref 0–149)
VLDL Cholesterol Cal: 15 mg/dL (ref 5–40)

## 2020-05-06 ENCOUNTER — Other Ambulatory Visit: Payer: Self-pay

## 2020-05-06 ENCOUNTER — Encounter (HOSPITAL_COMMUNITY): Payer: Self-pay | Admitting: Psychiatry

## 2020-05-06 ENCOUNTER — Telehealth (INDEPENDENT_AMBULATORY_CARE_PROVIDER_SITE_OTHER): Payer: Medicare Other | Admitting: Psychiatry

## 2020-05-06 DIAGNOSIS — F4001 Agoraphobia with panic disorder: Secondary | ICD-10-CM | POA: Diagnosis not present

## 2020-05-06 MED ORDER — ESCITALOPRAM OXALATE 20 MG PO TABS: 20.0000 mg | ORAL_TABLET | Freq: Every day | ORAL | 1 refills | Status: DC

## 2020-05-06 NOTE — Progress Notes (Signed)
[facility] MD OP Progress Note  Virtual Visit via Telephone Note  I connected with Richard Bean on 05/06/20 at  2:30 PM EDT by telephone and verified that I am speaking with the correct person using two identifiers.  Location: Patient: home Provider: Clinic   I discussed the limitations, risks, security and privacy concerns of performing an evaluation and management service by telephone and the availability of in person appointments. I also discussed with the patient that there may be a patient responsible charge related to this service. The patient expressed understanding and agreed to proceed.   I provided 13 minutes of non-face-to-face time during this encounter.    05/06/2020 3:01 PM JAQUAE Bean  MRN:  [id number]  Chief Complaint: " Everything is going great."  HPI: Patient reported he is doing well.  He informed that he is hanging in there and denies any significant changes since our last visit.  He informed his mood has been stable and is sleeping well.  He denied any other concerns at this time.  Visit Diagnosis:    ICD-10-CM   1. Panic disorder with agoraphobia and mild panic attacks  F40.01     Past Psychiatric History: Panic d/o and agoraphobia  Past Medical History:  Past Medical History:  Diagnosis Date  . Anxiety   . Cancer ([place])   . Depression   . GERD (gastroesophageal reflux disease)   . H/O prostate cancer   . Heart murmur   . Panic disorder with agoraphobia     Past Surgical History:  Procedure Laterality Date  . ANKLE SURGERY Right   . PROSTATE SURGERY      Family Psychiatric History: denied  Family History:  Family History  Problem Relation Age of Onset  . COPD Mother   . Prostate cancer Father   . Cancer - Lung Father   . Depression Sister   . Stomach cancer Sister   . Alcohol abuse Brother   . Heart block Brother   . Breast cancer Sister   . Prostate cancer Brother   . Skin cancer Brother     Social History:  Social History    Socioeconomic History  . Marital status: Married    Spouse name: Not on file  . Number of children: Not on file  . Years of education: Not on file  . Highest education level: Not on file  Occupational History  . Not on file  Tobacco Use  . Smoking status: Current Every Day Smoker    Packs/day: 1.00    Years: 47.00    Pack years: 47.00    Types: Cigarettes    Start date: 05/06/1971  . Smokeless tobacco: Never Used  Vaping Use  . Vaping Use: Never used  Substance and Sexual Activity  . Alcohol use: Yes    Alcohol/week: 16.0 standard drinks    Types: 6 Cans of beer, 10 Shots of liquor per week  . Drug use: No  . Sexual activity: Yes  Other Topics Concern  . Not on file  Social History Narrative  . Not on file   Social Determinants of Health   Financial Resource Strain:   . Difficulty of Paying Living Expenses: Not on file  Food Insecurity:   . Worried About Charity fundraiser in the Last Year: Not on file  . Ran Out of Food in the Last Year: Not on file  Transportation Needs:   . Lack of Transportation (Medical): Not on file  . Lack of  Transportation (Non-Medical): Not on file  Physical Activity:   . Days of Exercise per Week: Not on file  . Minutes of Exercise per Session: Not on file  Stress:   . Feeling of Stress : Not on file  Social Connections:   . Frequency of Communication with Friends and Family: Not on file  . Frequency of Social Gatherings with Friends and Family: Not on file  . Attends Religious Services: Not on file  . Active Member of Clubs or Organizations: Not on file  . Attends Archivist Meetings: Not on file  . Marital Status: Not on file    Allergies:  Allergies  Allergen Reactions  . Chlorpheniramine-Phenylephrine     Metabolic Disorder Labs: Lab Results  Component Value Date   HGBA1C 5.4 05/03/2020   No results found for: PROLACTIN Lab Results  Component Value Date   CHOL 223 (H) 05/03/2020   TRIG 88 05/03/2020    HDL 61 05/03/2020   CHOLHDL 3.8 10/16/2019   LDLCALC 147 (H) 05/03/2020   LDLCALC 127 (H) 10/16/2019   Lab Results  Component Value Date   TSH 1.270 03/09/2019    Therapeutic Level Labs: No results found for: LITHIUM No results found for: VALPROATE No components found for:  CBMZ  Current Medications: Current Outpatient Medications  Medication Sig Dispense Refill  . amLODipine (NORVASC) 5 MG tablet Take 5 mg by mouth daily.    . enalapril (VASOTEC) 5 MG tablet Take 5 mg by mouth daily.    Marland Kitchen escitalopram (LEXAPRO) 20 MG tablet Take 1 tablet (20 mg total) by mouth daily. 90 tablet 1  . eszopiclone (LUNESTA) 1 MG TABS tablet Take 1 tablet (1 mg total) by mouth at bedtime as needed for sleep. Take immediately before bedtime 30 tablet 0  . imipramine (TOFRANIL) 25 MG tablet TAKE 1 TABLET AT BEDTIME 90 tablet 3  . sildenafil (REVATIO) 20 MG tablet Take 20 mg by mouth as directed.     No current facility-administered medications for this visit.    Musculoskeletal: Strength & Muscle Tone: unable to assess due to telemed visit Gait & Station: unable to assess due to telemed visit Patient leans: unable to assess due to telemed visit   Psychiatric Specialty Exam: ROS  There were no vitals taken for this visit.There is no height or weight on file to calculate BMI.  General Appearance: unable to assess due to phone visit  Eye Contact:  unable to assess due to phone visit    Speech:  Clear and Coherent and Normal Rate  Volume:  Normal  Mood:  Euthymic  Affect:  Appropriate  Thought Process:  Goal Directed, Linear and Descriptions of Associations: Intact  Orientation:  Full (Time, Place, and Person)  Thought Content: Logical   Suicidal Thoughts:  No  Homicidal Thoughts:  No  Memory:  Recent;   Good Remote;   Good  Judgement:  Fair  Insight:  Fair  Psychomotor Activity:  Normal  Concentration:  Concentration: Good and Attention Span: Good  Recall:  Good  Fund of Knowledge: Good   Language: Good  Akathisia:  Negative  Handed:  Right  AIMS (if indicated): not done  Assets:  Communication Skills Desire for Improvement Financial Resources/Insurance Housing Social Support Talents/Skills Transportation  ADL's:  Intact  Cognition: WNL  Sleep:  Good   Screenings: AUDIT     Clinical Support from 04/04/2020 in [facility]  Alcohol Use Disorder Identification Test Final Score (AUDIT) 4  PHQ2-9     Clinical Support from 04/04/2020 in [facility] Visit from 03/07/2018 in [facility] Visit from 10/12/2016 in [facility] Visit from 06/17/2015 in [facility]  PHQ-2 Total Score 2 6 0 0  PHQ-9 Total Score 7 12 -- --       Assessment and Plan: Patient is stable on his current regimen.  1. Panic disorder with agoraphobia and mild panic attacks  Continue escitalopram (LEXAPRO) 20 MG tablet; Take 1 tablet (20 mg total) by mouth daily.  Dispense: 90 tablet; Refill: 1  Follow up in 4 months.   [place] Crane, MD 05/06/2020, 3:01 PM

## 2020-05-08 ENCOUNTER — Other Ambulatory Visit: Payer: Self-pay

## 2020-05-08 ENCOUNTER — Encounter: Payer: Self-pay | Admitting: Gastroenterology

## 2020-05-08 ENCOUNTER — Ambulatory Visit (INDEPENDENT_AMBULATORY_CARE_PROVIDER_SITE_OTHER): Payer: Medicare Other | Admitting: Gastroenterology

## 2020-05-08 VITALS — BP 145/78 | HR 90 | Temp 98.2°F | Ht 72.0 in | Wt 224.5 lb

## 2020-05-08 DIAGNOSIS — K760 Fatty (change of) liver, not elsewhere classified: Secondary | ICD-10-CM | POA: Diagnosis not present

## 2020-05-08 DIAGNOSIS — K7469 Other cirrhosis of liver: Secondary | ICD-10-CM

## 2020-05-08 NOTE — Progress Notes (Signed)
Richard Darby, MD [address]  [place]  [place], [place] [address]  Main: [phone]  Fax: [phone]    Gastroenterology Consultation  Referring Provider:     Florian Buff* Primary Care Physician:  Mar Daring, PA-C Primary Gastroenterologist:  Dr. Cephas Bean Reason for Consultation:     Possible cirrhosis of liver        HPI:   Richard Bean is a 69 y.o. male referred by Dr. [name], [name], PA-C  for consultation & management of possible seizures evaluated.  Patient with history of obesity, chronic kidney disease, hypertension is seen in consultation for possible cirrhosis of liver.  This was identified on CT chest performed for lung cancer screening.  Therefore, referred to GI by Dr. [name].  Patient does not have any signs or symptoms to suggest chronic liver disease.  He does admit to drinking 3-4 drinks of alcohol once or twice per week.  He does smoke cigarettes, 1 pack/day.  His labs revealed mild elevated ALT in 09/2019.  Repeat LFTs were normal.  No evidence of thrombocytopenia, anemia.  NSAIDs: None  Antiplts/Anticoagulants/Anti thrombotics: None  GI Procedures: Underwent colonoscopy approximately 5 years ago, he was told he is not due for surveillance yet  Patient is a retired Financial planner.  He lives alone  Past Medical History:  Diagnosis Date  . Anxiety   . Cancer ([place])   . Depression   . GERD (gastroesophageal reflux disease)   . H/O prostate cancer   . Heart murmur   . Panic disorder with agoraphobia     Past Surgical History:  Procedure Laterality Date  . ANKLE SURGERY Right   . PROSTATE SURGERY      Current Outpatient Medications:  .  amLODipine (NORVASC) 5 MG tablet, Take 5 mg by mouth daily., Disp: , Rfl:  .  enalapril (VASOTEC) 5 MG tablet, Take 5 mg by mouth daily., Disp: , Rfl:  .  escitalopram (LEXAPRO) 20 MG tablet, Take 1 tablet (20 mg total) by mouth daily., Disp: 90 tablet, Rfl: 1 .   eszopiclone (LUNESTA) 1 MG TABS tablet, Take 1 tablet (1 mg total) by mouth at bedtime as needed for sleep. Take immediately before bedtime, Disp: 30 tablet, Rfl: 0 .  imipramine (TOFRANIL) 25 MG tablet, TAKE 1 TABLET AT BEDTIME, Disp: 90 tablet, Rfl: 3 .  sildenafil (REVATIO) 20 MG tablet, Take 20 mg by mouth as directed., Disp: , Rfl:    Family History  Problem Relation Age of Onset  . COPD Mother   . Prostate cancer Father   . Cancer - Lung Father   . Depression Sister   . Stomach cancer Sister   . Alcohol abuse Brother   . Heart block Brother   . Breast cancer Sister   . Prostate cancer Brother   . Skin cancer Brother      Social History   Tobacco Use  . Smoking status: Current Every Day Smoker    Packs/day: 1.00    Years: 47.00    Pack years: 47.00    Types: Cigarettes    Start date: 05/06/1971  . Smokeless tobacco: Never Used  Vaping Use  . Vaping Use: Never used  Substance Use Topics  . Alcohol use: Yes    Alcohol/week: 16.0 standard drinks    Types: 6 Cans of beer, 10 Shots of liquor per week    Comment: in 1 to 2 times a week   . Drug  use: No    Allergies as of 05/08/2020 - Review Complete 05/08/2020  Allergen Reaction Noted  . Chlorpheniramine-phenylephrine  01/31/2015    Review of Systems:    All systems reviewed and negative except where noted in HPI.   Physical Exam:  BP (!) 145/78 (BP Location: Left Arm, Patient Position: Sitting, Cuff Size: Normal)   Pulse 90   Temp 98.2 F (36.8 C) (Oral)   Ht 6' (1.829 m)   Wt 224 lb 8 oz (101.8 kg)   BMI 30.45 kg/m  No LMP for male patient.  General:   Alert,  Well-developed, well-nourished, pleasant and cooperative in NAD Head:  Normocephalic and atraumatic. Eyes:  Sclera clear, no icterus.   Conjunctiva pink. Ears:  Normal auditory acuity. Nose:  No deformity, discharge, or lesions. Mouth:  No deformity or lesions,oropharynx pink & moist. Neck:  Supple; no masses or thyromegaly. Lungs:  Respirations  even and unlabored.  Clear throughout to auscultation.   No wheezes, crackles, or rhonchi. No acute distress. Heart:  Regular rate and rhythm; no murmurs, clicks, rubs, or gallops. Abdomen:  Normal bowel sounds. Soft, obese, non-tender and non-distended without masses, hepatosplenomegaly or hernias noted.  No guarding or rebound tenderness.   Rectal: Not performed Msk:  Symmetrical without gross deformities. Good, equal movement & strength bilaterally. Pulses:  Normal pulses noted. Extremities:  No clubbing or edema.  No cyanosis. Neurologic:  Alert and oriented x3;  grossly normal neurologically. Skin: Spider nevi on upper anterior chest wall, intact without significant lesions or rashes. No jaundice. Lymph Nodes:  No significant cervical adenopathy. Psych:  Alert and cooperative. Normal mood and affect.  Imaging Studies: Reviewed  Assessment and Plan:   RANVEER [name] is a 69 y.o. pleasant Caucasian male with obesity, hypertension, CKD is seen in consultation for possible cirrhosis of liver.  Patient is at risk for fatty liver and progression to cirrhosis.  Hepatitis B surface antigen negative, HCV antibody negative, hepatitis B core antibody negative  Recommend ultrasound liver with Doppler as well as ultrasound abdomen to evaluate splenomegaly Check iron panel, AFP, alpha-1 antitrypsin, ceruloplasmin, ANA, AMA, ASMA  Discussed about lifestyle modification including diet and exercise, weight loss Strongly advised to quit drinking alcohol Discussed about smoking cessation, patient is not ready to quit yet   Follow up in 6 to 8 weeks   Richard Darby, MD

## 2020-05-08 NOTE — Patient Instructions (Addendum)
Your Ultrasound Is scheduled for 05/14/2020 [facility]. Nothing to eat or drink after midnight

## 2020-05-09 LAB — IRON,TIBC AND FERRITIN PANEL
Ferritin: 284 ng/mL (ref 30–400)
Iron Saturation: 30 % (ref 15–55)
Iron: 92 ug/dL (ref 38–169)
Total Iron Binding Capacity: 310 ug/dL (ref 250–450)
UIBC: 218 ug/dL (ref 111–343)

## 2020-05-09 LAB — MITOCHONDRIAL/SMOOTH MUSCLE AB PNL
Mitochondrial Ab: <20 Units (ref 0.0–20.0)
Smooth Muscle Ab: 6 Units (ref 0–19)

## 2020-05-09 LAB — AFP TUMOR MARKER: AFP, Serum, Tumor Marker: 7.5 ng/mL (ref 0.0–8.3)

## 2020-05-09 LAB — ANA: Anti Nuclear Antibody (ANA): NEGATIVE

## 2020-05-09 LAB — ALPHA-1-ANTITRYPSIN: A-1 Antitrypsin: 158 mg/dL (ref 101–187)

## 2020-05-09 LAB — CERULOPLASMIN: Ceruloplasmin: 24.7 mg/dL (ref 16.0–31.0)

## 2020-05-10 ENCOUNTER — Telehealth: Payer: Self-pay

## 2020-05-10 NOTE — Telephone Encounter (Signed)
Called and left a message for call back  

## 2020-05-10 NOTE — Telephone Encounter (Signed)
Patient verbalized understanding of results  

## 2020-05-10 NOTE — Telephone Encounter (Signed)
-----   Message from Lin Landsman, MD sent at 05/09/2020  5:27 PM EDT ----- Labs normal for secondary liver disease work-up.  Most likely, cirrhosis secondary to fatty liver  Rohini Vanga

## 2020-05-14 ENCOUNTER — Other Ambulatory Visit: Payer: Self-pay

## 2020-05-14 ENCOUNTER — Ambulatory Visit
Admission: RE | Admit: 2020-05-14 | Discharge: 2020-05-14 | Disposition: A | Discharge status: Home / Self Care | Payer: Medicare Other | Source: Ambulatory Visit | Attending: Gastroenterology | Admitting: Gastroenterology

## 2020-05-14 ENCOUNTER — Telehealth: Payer: Self-pay

## 2020-05-14 DIAGNOSIS — K7689 Other specified diseases of liver: Secondary | ICD-10-CM | POA: Diagnosis not present

## 2020-05-14 DIAGNOSIS — K7469 Other cirrhosis of liver: Secondary | ICD-10-CM | POA: Insufficient documentation

## 2020-05-14 NOTE — Telephone Encounter (Signed)
-----   Message from Lin Landsman, MD sent at 05/14/2020  4:33 PM EDT ----- Ultrasound liver suggest that he has mild cirrhosis.  Recommend upper endoscopy to evaluate for esophageal varices which are the changes that can occur secondary to cirrhosis  Rohini Vanga

## 2020-05-14 NOTE — Telephone Encounter (Signed)
Called and left a message for call back  

## 2020-05-15 ENCOUNTER — Other Ambulatory Visit: Payer: Self-pay

## 2020-05-15 DIAGNOSIS — I8500 Esophageal varices without bleeding: Secondary | ICD-10-CM

## 2020-05-15 NOTE — Telephone Encounter (Signed)
Patient verablized understanding of results. He made a EGD procedure on 06/05/2020. Went over instructions with patient and mailed them to patient. Informed patient of COVID testing

## 2020-05-21 ENCOUNTER — Other Ambulatory Visit: Payer: Self-pay | Admitting: Physician Assistant

## 2020-05-21 DIAGNOSIS — F331 Major depressive disorder, recurrent, moderate: Secondary | ICD-10-CM

## 2020-05-21 NOTE — Telephone Encounter (Signed)
Requested medication (s) are due for refill today: no  Requested medication (s) are on the active medication list: yes  Last refill:  02/01/2020  Future visit scheduled: no  Notes to clinic:  medication not prescribe by your office    Requested Prescriptions  Pending Prescriptions Disp Refills   imipramine (TOFRANIL) 25 MG tablet [Pharmacy Med Name: IMIPRAMINE HCL TABS 25MG ] 90 tablet 3    Sig: TAKE 1 TABLET AT BEDTIME      Psychiatry:  Antidepressants - Heterocyclics (TCAs) Passed - 05/21/2020  3:34 AM      Passed - Completed PHQ-2 or PHQ-9 in the last 360 days.      Passed - Valid encounter within last 6 months    Recent Outpatient Visits           2 months ago Alcoholic cirrhosis of liver without ascites [facility])   [place], [place], [place]   7 months ago COPD with exacerbation [facility])   [place], [place], [place]   1 year ago Hypertension, renal disease, stage 1-4 or unspecified chronic kidney disease   [facility] [place], [place], [place]   2 years ago Cigarette nicotine dependence without complication   [place], [place], [place]   2 years ago Annual physical exam   [facility] [place], [place], [place]       Future Appointments             In 1 week Stoioff, Ronda Fairly, MD [place]   In 1 month Vanga, Tally Due, MD [place]

## 2020-05-29 NOTE — Progress Notes (Addendum)
05/30/2020 1:42 PM   Richard Bean November 02, 1950 [id number]  Referring provider: Trinna Post, PA-C [address] [place],  [place] [address] Chief Complaint  Patient presents with  . incomplete bladder emptying   Urologic history: 1.  History of prostate cancer -[facility] [place]; undetectable PSA   HPI: Richard Bean is a 69 y.o. male with a history of malignant neoplasm of prostate and lower urinary tract symptoms/stress incontinence who returns for an annual follow up.   -Former patient of Dr. Jacqlyn Larsen at [facility].  -PSA was undetectable in 05/2019. -He is doing well. -Denies any issues since last year.  -Was started on imipramine by Dr. Jacqlyn Larsen for mild stress incontinence which he continues -PVR 0 mL.     PMH: Past Medical History:  Diagnosis Date  . Anxiety   . Cancer ([place])   . Depression   . GERD (gastroesophageal reflux disease)   . H/O prostate cancer   . Heart murmur   . Panic disorder with agoraphobia     Surgical History: Past Surgical History:  Procedure Laterality Date  . ANKLE SURGERY Right   . PROSTATE SURGERY      Home Medications:  Allergies as of 05/30/2020      Reactions   Chlorpheniramine-phenylephrine       Medication List       Accurate as of May 30, 2020  1:42 PM. If you have any questions, ask your nurse or doctor.        amLODipine 5 MG tablet Commonly known as: NORVASC Take 5 mg by mouth daily.   enalapril 5 MG tablet Commonly known as: VASOTEC Take 5 mg by mouth daily.   escitalopram 20 MG tablet Commonly known as: LEXAPRO Take 1 tablet (20 mg total) by mouth daily.   eszopiclone 1 MG Tabs tablet Commonly known as: LUNESTA Take 1 tablet (1 mg total) by mouth at bedtime as needed for sleep. Take immediately before bedtime   imipramine 25 MG tablet Commonly known as: TOFRANIL TAKE 1 TABLET AT BEDTIME   sildenafil 20 MG tablet Commonly known as: REVATIO Take 20 mg by mouth as directed.        Allergies:  Allergies  Allergen Reactions  . Chlorpheniramine-Phenylephrine     Family History: Family History  Problem Relation Age of Onset  . COPD Mother   . Prostate cancer Father   . Cancer - Lung Father   . Depression Sister   . Stomach cancer Sister   . Alcohol abuse Brother   . Heart block Brother   . Breast cancer Sister   . Prostate cancer Brother   . Skin cancer Brother     Social History:  reports that he has been smoking cigarettes. He started smoking about 49 years ago. He has a 47.00 pack-year smoking history. He has never used smokeless tobacco. He reports current alcohol use of about 16.0 standard drinks of alcohol per week. He reports that he does not use drugs.   Physical Exam: BP (!) 145/79   Pulse 85   Ht 6' (1.829 m)   Wt 220 lb 14.4 oz (100.2 kg)   BMI 29.96 kg/m   Constitutional:  Alert and oriented, No acute distress. HEENT: [place] AT, moist mucus membranes.  Trachea midline, no masses. Cardiovascular: No clubbing, cyanosis, or edema. Respiratory: Normal respiratory effort, no increased work of breathing. Lymph: No cervical or inguinal lymphadenopathy. Skin: No rashes, bruises or suspicious lesions. Neurologic: Grossly intact, no focal deficits,  moving all 4 extremities. Psychiatric: Normal mood and affect.   Assessment & Plan:    1. History of prostate cancer PSA is undetectable as of 05/2019. PSA today, will call with results.   2. Lower urinary tract symptoms/stress incontinence Imipramine was refilled. PVR by bladder scan is 0 mL.    3. ED Sildenafil was refilled.    Follow up in 1 year w/ PSA.   [place] [address] [place], [place] [address] [phone]  I, Selena Batten, am acting as a scribe for Dr. Nicki Reaper C. Victorine Mcnee,  I have reviewed the above documentation for accuracy and completeness, and I agree with the above.    Abbie Sons, MD

## 2020-05-30 ENCOUNTER — Encounter: Payer: Self-pay | Admitting: Urology

## 2020-05-30 ENCOUNTER — Other Ambulatory Visit: Payer: Self-pay

## 2020-05-30 ENCOUNTER — Ambulatory Visit (INDEPENDENT_AMBULATORY_CARE_PROVIDER_SITE_OTHER): Payer: Medicare Other | Admitting: Urology

## 2020-05-30 VITALS — BP 145/79 | HR 85 | Ht 72.0 in | Wt 220.9 lb

## 2020-05-30 DIAGNOSIS — Z125 Encounter for screening for malignant neoplasm of prostate: Secondary | ICD-10-CM

## 2020-05-30 DIAGNOSIS — R339 Retention of urine, unspecified: Secondary | ICD-10-CM | POA: Diagnosis not present

## 2020-05-30 DIAGNOSIS — F331 Major depressive disorder, recurrent, moderate: Secondary | ICD-10-CM

## 2020-05-30 LAB — BLADDER SCAN AMB NON-IMAGING: Scan Result: 0

## 2020-05-31 LAB — PSA: Prostate Specific Ag, Serum: <0.1 ng/mL (ref 0.0–4.0)

## 2020-06-02 NOTE — Progress Notes (Signed)
PSA remains undetectable at <0.1

## 2020-06-03 ENCOUNTER — Other Ambulatory Visit: Payer: Medicare Other | Attending: Gastroenterology

## 2020-06-03 ENCOUNTER — Telehealth: Payer: Self-pay | Admitting: *Deleted

## 2020-06-03 ENCOUNTER — Telehealth: Payer: Self-pay | Admitting: Gastroenterology

## 2020-06-03 MED ORDER — IMIPRAMINE HCL 25 MG PO TABS: 25.0000 mg | ORAL_TABLET | Freq: Every day | ORAL | 2 refills | Status: DC

## 2020-06-03 NOTE — Telephone Encounter (Signed)
-----   Message from Abbie Sons, MD sent at 06/03/2020  9:10 AM EST ----- Regarding: Rx Good morning, please find out from patient where he wanted the Rx for sildenafil sent.  The only pharmacy listed in epic was a mail order pharmacy.  Thanks

## 2020-06-03 NOTE — Telephone Encounter (Signed)
Patient LVM to cancel his procedure scheduled on 06/05/2020.

## 2020-06-03 NOTE — Telephone Encounter (Signed)
[facility] [place]

## 2020-06-03 NOTE — Telephone Encounter (Signed)
Notified patient as instructed, patient pleased °

## 2020-06-03 NOTE — Telephone Encounter (Signed)
-----   Message from Abbie Sons, MD sent at 06/02/2020 11:44 AM EST ----- PSA remains undetectable at <0.1

## 2020-06-03 NOTE — Telephone Encounter (Signed)
Patient states he wants to rescheduled his procedure. We moved patient to 06/26/2020 and COVID test on 06/24/2020. He verbalized understanding of times

## 2020-06-12 ENCOUNTER — Encounter: Payer: Self-pay | Admitting: Dermatology

## 2020-06-12 ENCOUNTER — Other Ambulatory Visit: Payer: Self-pay

## 2020-06-12 ENCOUNTER — Ambulatory Visit (INDEPENDENT_AMBULATORY_CARE_PROVIDER_SITE_OTHER): Payer: Medicare Other | Admitting: Dermatology

## 2020-06-12 DIAGNOSIS — L578 Other skin changes due to chronic exposure to nonionizing radiation: Secondary | ICD-10-CM | POA: Diagnosis not present

## 2020-06-12 DIAGNOSIS — L821 Other seborrheic keratosis: Secondary | ICD-10-CM

## 2020-06-12 DIAGNOSIS — D1800 Hemangioma unspecified site: Secondary | ICD-10-CM | POA: Diagnosis not present

## 2020-06-12 DIAGNOSIS — Z1283 Encounter for screening for malignant neoplasm of skin: Secondary | ICD-10-CM

## 2020-06-12 DIAGNOSIS — L570 Actinic keratosis: Secondary | ICD-10-CM

## 2020-06-12 DIAGNOSIS — L814 Other melanin hyperpigmentation: Secondary | ICD-10-CM | POA: Diagnosis not present

## 2020-06-12 DIAGNOSIS — D229 Melanocytic nevi, unspecified: Secondary | ICD-10-CM

## 2020-06-12 NOTE — Progress Notes (Signed)
   Follow-Up Visit   Subjective  Richard Bean is a 69 y.o. male who presents for the following: mole check (Upper body skin exam, 57m f/u, hx of AKs). The patient presents for Upper Body Skin Exam (UBSE) for skin cancer screening and mole check.  The following portions of the chart were reviewed this encounter and updated as appropriate:  Tobacco  Allergies  Meds  Problems  Med Hx  Surg Hx  Fam Hx     Review of Systems:  No other skin or systemic complaints except as noted in HPI or Assessment and Plan.  Objective  Well appearing patient in no apparent distress; mood and affect are within normal limits.  All skin waist up examined.  Objective  L forehead x 1, scalp x 4 (5): Pink scaly macules    Assessment & Plan   .Lentigines - Scattered tan macules - Discussed due to sun exposure - Benign, observe - Call for any changes  Seborrheic Keratoses - Stuck-on, waxy, tan-brown papules and plaques  - Discussed benign etiology and prognosis. - Observe - Call for any changes  Melanocytic Nevi - Tan-brown and/or pink-flesh-colored symmetric macules and papules - Benign appearing on exam today - Observation - Call clinic for new or changing moles - Recommend daily use of broad spectrum spf 30+ sunscreen to sun-exposed areas.   Hemangiomas - Red papules - Discussed benign nature - Observe - Call for any changes  Actinic Damage - Chronic, secondary to cumulative UV/sun exposure - diffuse scaly erythematous macules with underlying dyspigmentation - Recommend daily broad spectrum sunscreen SPF 30+ to sun-exposed areas, reapply every 2 hours as needed.  - Call for new or changing lesions.  Skin cancer screening performed today.   AK (actinic keratosis) (5) L forehead x 1, scalp x 4    Destruction of lesion - L forehead x 1, scalp x 4 Complexity: simple   Destruction method: cryotherapy   Informed consent: discussed and consent obtained   Timeout:  patient  name, date of birth, surgical site, and procedure verified Lesion destroyed using liquid nitrogen: Yes   Region frozen until ice ball extended beyond lesion: Yes   Outcome: patient tolerated procedure well with no complications   Post-procedure details: wound care instructions given    Return in about 6 months (around 12/10/2020) for TBSE, hx of AKs.  I, Othelia Pulling, RMA, am acting as scribe for Sarina Ser, MD .  Documentation: I have reviewed the above documentation for accuracy and completeness, and I agree with the above.  Sarina Ser, MD

## 2020-06-14 ENCOUNTER — Encounter: Payer: Self-pay | Admitting: Dermatology

## 2020-06-24 ENCOUNTER — Other Ambulatory Visit
Admission: RE | Admit: 2020-06-24 | Discharge: 2020-06-24 | Disposition: A | Discharge status: Home / Self Care | Payer: Medicare Other | Source: Ambulatory Visit | Attending: Gastroenterology | Admitting: Gastroenterology

## 2020-06-24 ENCOUNTER — Other Ambulatory Visit: Payer: Self-pay

## 2020-06-24 DIAGNOSIS — Z20822 Contact with and (suspected) exposure to covid-19: Secondary | ICD-10-CM | POA: Diagnosis not present

## 2020-06-24 DIAGNOSIS — Z01812 Encounter for preprocedural laboratory examination: Secondary | ICD-10-CM | POA: Insufficient documentation

## 2020-06-24 LAB — SARS CORONAVIRUS 2 (TAT 6-24 HRS): SARS Coronavirus 2: NEGATIVE

## 2020-06-25 ENCOUNTER — Encounter: Payer: Self-pay | Admitting: Gastroenterology

## 2020-06-26 ENCOUNTER — Other Ambulatory Visit: Payer: Self-pay

## 2020-06-26 ENCOUNTER — Encounter: Payer: Self-pay | Admitting: Gastroenterology

## 2020-06-26 ENCOUNTER — Ambulatory Visit
Admission: RE | Admit: 2020-06-26 | Discharge: 2020-06-26 | Disposition: A | Discharge status: Home / Self Care | Payer: Medicare Other | Attending: Gastroenterology | Admitting: Gastroenterology

## 2020-06-26 ENCOUNTER — Encounter
Admission: RE | Disposition: A | Discharge status: Home / Self Care | Payer: Self-pay | Source: Home / Self Care | Attending: Gastroenterology

## 2020-06-26 ENCOUNTER — Ambulatory Visit: Payer: Medicare Other | Admitting: Anesthesiology

## 2020-06-26 DIAGNOSIS — K2270 Barrett's esophagus without dysplasia: Secondary | ICD-10-CM | POA: Diagnosis not present

## 2020-06-26 DIAGNOSIS — N183 Chronic kidney disease, stage 3 unspecified: Secondary | ICD-10-CM | POA: Diagnosis not present

## 2020-06-26 DIAGNOSIS — Z79899 Other long term (current) drug therapy: Secondary | ICD-10-CM | POA: Insufficient documentation

## 2020-06-26 DIAGNOSIS — Z13810 Encounter for screening for upper gastrointestinal disorder: Secondary | ICD-10-CM | POA: Insufficient documentation

## 2020-06-26 DIAGNOSIS — Z888 Allergy status to other drugs, medicaments and biological substances status: Secondary | ICD-10-CM | POA: Insufficient documentation

## 2020-06-26 DIAGNOSIS — I8500 Esophageal varices without bleeding: Secondary | ICD-10-CM

## 2020-06-26 DIAGNOSIS — K2980 Duodenitis without bleeding: Secondary | ICD-10-CM | POA: Insufficient documentation

## 2020-06-26 DIAGNOSIS — K7460 Unspecified cirrhosis of liver: Secondary | ICD-10-CM

## 2020-06-26 DIAGNOSIS — K7469 Other cirrhosis of liver: Secondary | ICD-10-CM | POA: Diagnosis not present

## 2020-06-26 DIAGNOSIS — F17210 Nicotine dependence, cigarettes, uncomplicated: Secondary | ICD-10-CM | POA: Diagnosis not present

## 2020-06-26 DIAGNOSIS — K2950 Unspecified chronic gastritis without bleeding: Secondary | ICD-10-CM | POA: Insufficient documentation

## 2020-06-26 DIAGNOSIS — K2100 Gastro-esophageal reflux disease with esophagitis, without bleeding: Secondary | ICD-10-CM | POA: Insufficient documentation

## 2020-06-26 DIAGNOSIS — K317 Polyp of stomach and duodenum: Secondary | ICD-10-CM

## 2020-06-26 DIAGNOSIS — E782 Mixed hyperlipidemia: Secondary | ICD-10-CM | POA: Diagnosis not present

## 2020-06-26 DIAGNOSIS — I129 Hypertensive chronic kidney disease with stage 1 through stage 4 chronic kidney disease, or unspecified chronic kidney disease: Secondary | ICD-10-CM | POA: Diagnosis not present

## 2020-06-26 HISTORY — PX: ESOPHAGOGASTRODUODENOSCOPY (EGD) WITH PROPOFOL: SHX5813

## 2020-06-26 SURGERY — ESOPHAGOGASTRODUODENOSCOPY (EGD) WITH PROPOFOL
Anesthesia: General

## 2020-06-26 MED ORDER — PROPOFOL 10 MG/ML IV BOLUS
INTRAVENOUS | Status: DC | PRN
  Administered 2020-06-26 (×2): 20 mg via INTRAVENOUS
  Administered 2020-06-26: 70 mg via INTRAVENOUS
  Administered 2020-06-26: 20 mg via INTRAVENOUS
  Administered 2020-06-26: 30 mg via INTRAVENOUS

## 2020-06-26 MED ORDER — SODIUM CHLORIDE 0.9 % IV SOLN: INTRAVENOUS | Status: DC

## 2020-06-26 MED ORDER — OMEPRAZOLE 40 MG PO CPDR: 40.0000 mg | DELAYED_RELEASE_CAPSULE | Freq: Two times a day (BID) | ORAL | 3 refills | Status: DC

## 2020-06-26 NOTE — Op Note (Signed)
[facility] Gastroenterology Patient Name: Richard Bean Procedure Date: 06/26/2020 7:49 AM MRN: [id number] Account #: [id number] Date of Birth: [birth year] Admit Type: Outpatient Age: 69 Room: [facility] ENDO ROOM 4 Gender: Male Note Status: Finalized Procedure:             Upper GI endoscopy Indications:           Screening for Barrett's esophagus in patient at risk                         for this condition, Cirrhosis rule out esophageal                         varices Providers:             Lin Landsman MD, MD Referring MD:          Mar Daring (Referring MD) Medicines:             General Anesthesia Complications:         No immediate complications. Estimated blood loss: None. Procedure:             Pre-Anesthesia Assessment:                        - Prior to the procedure, a History and Physical was                         performed, and patient medications and allergies were                         reviewed. The patient is competent. The risks and                         benefits of the procedure and the sedation options and                         risks were discussed with the patient. All questions                         were answered and informed consent was obtained.                         Patient identification and proposed procedure were                         verified by the physician, the nurse, the                         anesthesiologist, the anesthetist and the technician                         in the pre-procedure area in the procedure room in the                         endoscopy suite. Mental Status Examination: alert and                         oriented. Airway Examination: normal oropharyngeal  airway and neck mobility. Respiratory Examination:                         clear to auscultation. CV Examination: normal.                         Prophylactic Antibiotics: The patient does not require                          prophylactic antibiotics. Prior Anticoagulants: The                         patient has taken no previous anticoagulant or                         antiplatelet agents. ASA Grade Assessment: III - A                         patient with severe systemic disease. After reviewing                         the risks and benefits, the patient was deemed in                         satisfactory condition to undergo the procedure. The                         anesthesia plan was to use general anesthesia.                         Immediately prior to administration of medications,                         the patient was re-assessed for adequacy to receive                         sedatives. The heart rate, respiratory rate, oxygen                         saturations, blood pressure, adequacy of pulmonary                         ventilation, and response to care were monitored                         throughout the procedure. The physical status of the                         patient was re-assessed after the procedure.                        After obtaining informed consent, the endoscope was                         passed under direct vision. Throughout the procedure,                         the patient's blood pressure, pulse, and oxygen  saturations were monitored continuously. The Endoscope                         was introduced through the mouth, and advanced to the                         second part of duodenum. The upper GI endoscopy was                         accomplished without difficulty. The patient tolerated                         the procedure well. Findings:      Localized mild mucosal changes characterized by nodularity were found in       the second portion of the duodenum. Biopsies were taken with a cold       forceps for histology.      The entire examined stomach was normal.      The cardia and gastric fundus were normal on retroflexion.      The  esophagus and gastroesophageal junction were examined with white       light and narrow band imaging (NBI) from a forward view and retroflexed       position. There were esophageal mucosal changes consistent with       long-segment Barrett's esophagus. These changes involved the mucosa at       the upper extent of the gastric folds (43 cm from the incisors)       extending to the Z-line (38 cm from the incisors). Multiple tongues of       salmon-colored mucosa were present and no visible abnormalities were       present. The maximum longitudinal extent of these esophageal mucosal       changes was 5 cm in length. Mucosa was biopsied with a cold forceps for       histology in 4 quadrants at intervals of 2 cm. The following biopsy       specimens were sent to pathology: targeted biopsies from 38 to 43 cm       from the incisors. A total of 3 specimen bottles were sent to pathology.      LA Grade C (one or more mucosal breaks continuous between tops of 2 or       more mucosal folds, less than 75% circumference) esophagitis with no       bleeding was found in the middle third of the esophagus. Impression:            - Mucosal changes in the duodenum. Biopsied.                        - Normal stomach.                        - Esophageal mucosal changes consistent with                         long-segment Barrett's esophagus. Biopsied.                        - LA Grade C reflux esophagitis with no bleeding. Recommendation:        - Discharge patient to home (with escort).                        -  Resume previous diet today.                        - Continue present medications.                        - Await pathology results.                        - Follow an antireflux regimen indefinitely.                        - Use Prilosec (omeprazole) 40 mg PO BID for 3 months.                        - Repeat upper endoscopy in 3 months to check healing.                        - Return to GI office in 3  months. Procedure Code(s):     --- Professional ---                        [phone], Esophagogastroduodenoscopy, flexible,                         transoral; with biopsy, single or multiple Diagnosis Code(s):     --- Professional ---                        K31.89, Other diseases of stomach and duodenum                        K22.8, Other specified diseases of esophagus                        K21.00, Gastro-esophageal reflux disease with                         esophagitis, without bleeding                        Z13.810, Encounter for screening for upper                         gastrointestinal disorder                        K74.60, Unspecified cirrhosis of liver CPT copyright 2019 American Medical Association. All rights reserved. The codes documented in this report are preliminary and upon coder review may  be revised to meet current compliance requirements. Dr. Ulyess Mort Lin Landsman MD, MD 06/26/2020 9:07:17 AM This report has been signed electronically. Number of Addenda: 0 Note Initiated On: 06/26/2020 7:49 AM Estimated Blood Loss:  Estimated blood loss: none.      [facility]

## 2020-06-26 NOTE — Transfer of Care (Signed)
Immediate Anesthesia Transfer of Care Note  Patient: Richard Bean  Procedure(s) Performed: ESOPHAGOGASTRODUODENOSCOPY (EGD) WITH PROPOFOL (N/A )  Patient Location: PACU  Anesthesia Type:General  Level of Consciousness: sedated  Airway & Oxygen Therapy: Patient Spontanous Breathing  Post-op Assessment: Report given to RN and Post -op Vital signs reviewed and stable  Post vital signs: Reviewed and stable  Last Vitals:  Vitals Value Taken Time  BP 95/63 06/26/20 0904  Temp    Pulse 77 06/26/20 0905  Resp 16 06/26/20 0905  SpO2 95 % 06/26/20 0905  Vitals shown include unvalidated device data.  Last Pain:  Vitals:   06/26/20 0803  TempSrc: Temporal  PainSc: 0-No pain         Complications: No complications documented.

## 2020-06-26 NOTE — H&P (Signed)
[name], MD [address]  [place]  [place], [place] [address]  Main: [phone]  Fax: [phone] Pager: [phone]  Primary Care Physician:  Mar Daring, PA-C Primary Gastroenterologist:  Dr. [name]  Pre-Procedure History & Physical: HPI:  Richard Bean is a 69 y.o. male is here for an endoscopy.   Past Medical History:  Diagnosis Date  . Actinic keratosis   . Anxiety   . Cancer ([place])   . Depression   . GERD (gastroesophageal reflux disease)   . H/O prostate cancer   . Heart murmur   . Panic disorder with agoraphobia     Past Surgical History:  Procedure Laterality Date  . ANKLE SURGERY Right   . PROSTATE SURGERY      Prior to Admission medications   Medication Sig Start Date End Date Taking? Authorizing Provider  amLODipine (NORVASC) 5 MG tablet Take 5 mg by mouth daily.    [provider]  enalapril (VASOTEC) 5 MG tablet Take 5 mg by mouth daily.    [provider]  escitalopram (LEXAPRO) 20 MG tablet Take 1 tablet (20 mg total) by mouth daily. 05/06/20   Nevada Crane, MD  eszopiclone (LUNESTA) 1 MG TABS tablet Take 1 tablet (1 mg total) by mouth at bedtime as needed for sleep. Take immediately before bedtime 04/04/20   Mar Daring, PA-C  imipramine (TOFRANIL) 25 MG tablet Take 1 tablet (25 mg total) by mouth at bedtime. 06/03/20   Stoioff, Ronda Fairly, MD  sildenafil (REVATIO) 20 MG tablet Take 20 mg by mouth as directed.    [provider]    Allergies as of 05/15/2020 - Review Complete 05/08/2020  Allergen Reaction Noted  . Chlorpheniramine-phenylephrine  01/31/2015    Family History  Problem Relation Age of Onset  . COPD Mother   . Prostate cancer Father   . Cancer - Lung Father   . Depression Sister   . Stomach cancer Sister   . Alcohol abuse Brother   . Heart block Brother   . Breast cancer Sister   . Prostate cancer Brother   . Skin cancer Brother     Social History    Socioeconomic History  . Marital status: Married    Spouse name: Not on file  . Number of children: Not on file  . Years of education: Not on file  . Highest education level: Not on file  Occupational History  . Not on file  Tobacco Use  . Smoking status: Current Every Day Smoker    Packs/day: 1.00    Years: 47.00    Pack years: 47.00    Types: Cigarettes    Start date: 05/06/1971  . Smokeless tobacco: Never Used  Vaping Use  . Vaping Use: Never used  Substance and Sexual Activity  . Alcohol use: Yes    Alcohol/week: 16.0 standard drinks    Types: 6 Cans of beer, 10 Shots of liquor per week    Comment: in 1 to 2 times a week   . Drug use: No  . Sexual activity: Yes  Other Topics Concern  . Not on file  Social History Narrative  . Not on file   Social Determinants of Health   Financial Resource Strain:   . Difficulty of Paying Living Expenses: Not on file  Food Insecurity:   . Worried About Charity fundraiser in the Last Year: Not on file  . Ran Out of Food in the Last  Year: Not on file  Transportation Needs:   . Lack of Transportation (Medical): Not on file  . Lack of Transportation (Non-Medical): Not on file  Physical Activity:   . Days of Exercise per Week: Not on file  . Minutes of Exercise per Session: Not on file  Stress:   . Feeling of Stress : Not on file  Social Connections:   . Frequency of Communication with Friends and Family: Not on file  . Frequency of Social Gatherings with Friends and Family: Not on file  . Attends Religious Services: Not on file  . Active Member of Clubs or Organizations: Not on file  . Attends Archivist Meetings: Not on file  . Marital Status: Not on file  Intimate Partner Violence:   . Fear of Current or Ex-Partner: Not on file  . Emotionally Abused: Not on file  . Physically Abused: Not on file  . Sexually Abused: Not on file    Review of Systems: See HPI, otherwise negative ROS  Physical Exam: BP  133/79   Pulse 82   Temp (!) 97.4 F (36.3 C) (Temporal)   Resp 18   Ht 6' (1.829 m)   Wt 98.4 kg   SpO2 100%   BMI 29.43 kg/m  General:   Alert,  pleasant and cooperative in NAD Head:  Normocephalic and atraumatic. Neck:  Supple; no masses or thyromegaly. Lungs:  Clear throughout to auscultation.    Heart:  Regular rate and rhythm. Abdomen:  Soft, nontender and nondistended. Normal bowel sounds, without guarding, and without rebound.   Neurologic:  Alert and  oriented x4;  grossly normal neurologically.  Impression/Plan: GARLAND HINCAPIE is here for an endoscopy to be performed for h/o cirrhosis, variceal screening  Risks, benefits, limitations, and alternatives regarding  endoscopy have been reviewed with the patient.  Questions have been answered.  All parties agreeable.   Sherri Sear, MD  06/26/2020, 8:39 AM

## 2020-06-26 NOTE — Anesthesia Preprocedure Evaluation (Signed)
Anesthesia Evaluation  Patient identified by MRN, date of birth, ID band Patient awake    Reviewed: Allergy & Precautions, NPO status , Patient's Chart, lab work & pertinent test results  Airway Mallampati: II       Dental no notable dental hx. (+) Upper Dentures, Lower Dentures   Pulmonary neg pulmonary ROS, Current Smoker and Patient abstained from smoking.,    Pulmonary exam normal        Cardiovascular + Valvular Problems/Murmurs  Rhythm:Regular Rate:Normal + Systolic murmurs    Neuro/Psych PSYCHIATRIC DISORDERS Anxiety Depression negative neurological ROS     GI/Hepatic Neg liver ROS, GERD  ,  Endo/Other  negative endocrine ROS  Renal/GU CRFRenal disease  negative genitourinary   Musculoskeletal negative musculoskeletal ROS (+)   Abdominal   Peds negative pediatric ROS (+)  Hematology negative hematology ROS (+)   Anesthesia Other Findings .Marland KitchenPast Medical History: No date: Actinic keratosis No date: Anxiety No date: Cancer ([place]) No date: Depression No date: GERD (gastroesophageal reflux disease) No date: H/O prostate cancer No date: Heart murmur No date: Panic disorder with agoraphobia   Reproductive/Obstetrics negative OB ROS                             Anesthesia Physical Anesthesia Plan  ASA: III  Anesthesia Plan: General   Post-op Pain Management:    Induction: Intravenous  PONV Risk Score and Plan: 1 and Propofol infusion  Airway Management Planned: Nasal Cannula  Additional Equipment:   Intra-op Plan:   Post-operative Plan:   Informed Consent: I have reviewed the patients History and Physical, chart, labs and discussed the procedure including the risks, benefits and alternatives for the proposed anesthesia with the patient or authorized representative who has indicated his/her understanding and acceptance.       Plan Discussed with: CRNA,  Anesthesiologist and Surgeon  Anesthesia Plan Comments:         Anesthesia Quick Evaluation

## 2020-06-26 NOTE — Anesthesia Postprocedure Evaluation (Signed)
Anesthesia Post Note  Patient: Richard Bean  Procedure(s) Performed: ESOPHAGOGASTRODUODENOSCOPY (EGD) WITH PROPOFOL (N/A )  Patient location during evaluation: Endoscopy Anesthesia Type: General Level of consciousness: awake and awake and alert Pain management: pain level controlled Vital Signs Assessment: post-procedure vital signs reviewed and stable Respiratory status: spontaneous breathing Cardiovascular status: blood pressure returned to baseline and stable Postop Assessment: no apparent nausea or vomiting Anesthetic complications: no   No complications documented.   Last Vitals:  Vitals:   06/26/20 0803 06/26/20 0904  BP: 133/79 95/63  Pulse: 82 76  Resp: 18 16  Temp: (!) 36.3 C (!) 36.2 C  SpO2: 100% 95%    Last Pain:  Vitals:   06/26/20 0904  TempSrc: Temporal  PainSc: 0-No pain                 Neva Seat

## 2020-06-27 ENCOUNTER — Encounter: Payer: Self-pay | Admitting: Gastroenterology

## 2020-06-27 LAB — SURGICAL PATHOLOGY

## 2020-07-03 ENCOUNTER — Encounter: Payer: Self-pay | Admitting: Gastroenterology

## 2020-07-03 ENCOUNTER — Ambulatory Visit (INDEPENDENT_AMBULATORY_CARE_PROVIDER_SITE_OTHER): Payer: Medicare Other | Admitting: Gastroenterology

## 2020-07-03 VITALS — BP 122/71 | HR 90 | Temp 98.6°F | Ht 72.0 in | Wt 225.0 lb

## 2020-07-03 DIAGNOSIS — K2100 Gastro-esophageal reflux disease with esophagitis, without bleeding: Secondary | ICD-10-CM

## 2020-07-03 DIAGNOSIS — K7581 Nonalcoholic steatohepatitis (NASH): Secondary | ICD-10-CM

## 2020-07-03 DIAGNOSIS — K2270 Barrett's esophagus without dysplasia: Secondary | ICD-10-CM | POA: Diagnosis not present

## 2020-07-03 DIAGNOSIS — K7460 Unspecified cirrhosis of liver: Secondary | ICD-10-CM

## 2020-07-03 MED ORDER — OMEPRAZOLE 40 MG PO CPDR: 40.0000 mg | DELAYED_RELEASE_CAPSULE | Freq: Two times a day (BID) | ORAL | 2 refills | Status: DC

## 2020-07-03 MED ORDER — OMEPRAZOLE 40 MG PO CPDR: 40.0000 mg | DELAYED_RELEASE_CAPSULE | Freq: Two times a day (BID) | ORAL | 0 refills | Status: DC

## 2020-07-03 NOTE — Addendum Note (Signed)
Addended by: Ulyess Blossom L on: 07/03/2020 04:32 PM   Modules accepted: Orders

## 2020-07-03 NOTE — Progress Notes (Signed)
[name], MD [address]  [place]  [place], [place] [address]  Main: [phone]  Fax: [phone]    Gastroenterology Consultation  Referring Provider:     Florian Buff* Primary Care Physician:  Mar Daring, PA-C Primary Gastroenterologist:  Dr. [name] Reason for Consultation: cirrhosis of liver, GERD, Barrett's esophagus        HPI:   Richard Bean is a 69 y.o. male referred by Dr. [name], [name], PA-C  for consultation & management of possible seizures evaluated.  Patient with history of obesity, chronic kidney disease, hypertension is seen in consultation for possible cirrhosis of liver.  This was identified on CT chest performed for lung cancer screening.  Therefore, referred to GI by Dr. [name].  Patient does not have any signs or symptoms to suggest chronic liver disease.  He does admit to drinking 3-4 drinks of alcohol once or twice per week.  He does smoke cigarettes, 1 pack/day.  His labs revealed mild elevated ALT in 09/2019.  Repeat LFTs were normal.  No evidence of thrombocytopenia, anemia.  Follow-up visit 07/03/2020 Patient underwent upper endoscopy, found to have long segment Barrett's esophagus without evidence of dysplasia.  He also had erosive esophagitis.  I started him on omeprazole 40 mg twice daily, patient did not receive his medication from [facility] yet.  Patient denies any symptoms today.  He has made significant changes in his eating habits, significantly cut back on red meat, high-calorie foods.  He does not have any concerns today.  He continues to smoke tobacco  NSAIDs: None  Antiplts/Anticoagulants/Anti thrombotics: None  GI Procedures:   Underwent colonoscopy in 10/22/2017, transverse colon polyp was removed, pathology consistent with tubular adenoma   Upper endoscopy 06/26/2020 - Mucosal changes in the duodenum. Biopsied. - Normal stomach. - Esophageal mucosal changes consistent with  long-segment Barrett's esophagus. Biopsied. - LA Grade C reflux esophagitis with no bleeding.  DIAGNOSIS:  A. DUODENUM, SECOND PORTION; COLD BIOPSY:  - MILD ACTIVE DUODENITIS.  - NEGATIVE FOR DYSPLASIA AND MALIGNANCY.   B. ESOPHAGUS, 43 CM; COLD BIOPSY:  - GASTRIC CARDIAC TYPE MUCOSA WITH MILD NON-SPECIFIC CHRONIC GASTRITIS.  - SQUAMOUS MUCOSA IS NOT PRESENT FOR EVALUATION.  - NEGATIVE FOR INTESTINAL METAPLASIA, DYSPLASIA, AND MALIGNANCY.   C. ESOPHAGUS, 41 CM; COLD BIOPSY:  - GLANDULAR COLUMNAR MUCOSA WITH INTESTINAL METAPLASIA, CONSISTENT WITH  BARRETT'S ESOPHAGUS GIVEN ENDOSCOPIC FINDINGS.  - SEPARATE SMALL FRAGMENT OF UNREMARKABLE SQUAMOUS MUCOSA.  - NEGATIVE FOR DYSPLASIA AND MALIGNANCY.  - DEEPER SECTIONS EXAMINED.   D. ESOPHAGUS, 39 CM; COLD BIOPSY:  - SQUAMOCOLUMNAR MUCOSA WITH INTESTINAL METAPLASIA, CONSISTENT WITH  BARRETT'S ESOPHAGUS GIVEN ENDOSCOPIC FINDINGS.  - NEGATIVE FOR DYSPLASIA AND MALIGNANCY.   Patient is a retired Financial planner.  He lives alone  Past Medical History:  Diagnosis Date  . Actinic keratosis   . Anxiety   . Cancer ([place])   . Depression   . GERD (gastroesophageal reflux disease)   . H/O prostate cancer   . Heart murmur   . Panic disorder with agoraphobia     Past Surgical History:  Procedure Laterality Date  . ANKLE SURGERY Right   . ESOPHAGOGASTRODUODENOSCOPY (EGD) WITH PROPOFOL N/A 06/26/2020   Procedure: ESOPHAGOGASTRODUODENOSCOPY (EGD) WITH PROPOFOL;  Surgeon: Lin Landsman, MD;  Location: [facility];  Service: Gastroenterology;  Laterality: N/A;  . PROSTATE SURGERY      Current Outpatient Medications:  .  amLODipine (NORVASC) 5 MG tablet, Take 5  mg by mouth daily., Disp: , Rfl:  .  enalapril (VASOTEC) 5 MG tablet, Take 5 mg by mouth daily., Disp: , Rfl:  .  escitalopram (LEXAPRO) 20 MG tablet, Take 1 tablet (20 mg total) by mouth daily., Disp: 90 tablet, Rfl: 1 .  eszopiclone (LUNESTA) 1 MG TABS tablet, Take 1 tablet  (1 mg total) by mouth at bedtime as needed for sleep. Take immediately before bedtime, Disp: 30 tablet, Rfl: 0 .  imipramine (TOFRANIL) 25 MG tablet, Take 1 tablet (25 mg total) by mouth at bedtime., Disp: 90 tablet, Rfl: 2 .  sildenafil (REVATIO) 20 MG tablet, Take 20 mg by mouth as directed., Disp: , Rfl:  .  omeprazole (PRILOSEC) 40 MG capsule, Take 1 capsule (40 mg total) by mouth 2 (two) times daily before a meal., Disp: 180 capsule, Rfl: 2   Family History  Problem Relation Age of Onset  . COPD Mother   . Prostate cancer Father   . Cancer - Lung Father   . Depression Sister   . Stomach cancer Sister   . Alcohol abuse Brother   . Heart block Brother   . Breast cancer Sister   . Prostate cancer Brother   . Skin cancer Brother      Social History   Tobacco Use  . Smoking status: Current Every Day Smoker    Packs/day: 1.00    Years: 47.00    Pack years: 47.00    Types: Cigarettes    Start date: 05/06/1971  . Smokeless tobacco: Never Used  Vaping Use  . Vaping Use: Never used  Substance Use Topics  . Alcohol use: Yes    Alcohol/week: 16.0 standard drinks    Types: 6 Cans of beer, 10 Shots of liquor per week    Comment: in 1 to 2 times a week   . Drug use: No    Allergies as of 07/03/2020 - Review Complete 07/03/2020  Allergen Reaction Noted  . Chlorpheniramine-phenylephrine  01/31/2015    Review of Systems:    All systems reviewed and negative except where noted in HPI.   Physical Exam:  BP 122/71 (BP Location: Left Arm, Patient Position: Sitting, Cuff Size: Normal)   Pulse 90   Temp 98.6 F (37 C) (Oral)   Ht 6' (1.829 m)   Wt 225 lb (102.1 kg)   BMI 30.52 kg/m  No LMP for male patient.  General:   Alert,  Well-developed, well-nourished, pleasant and cooperative in NAD Head:  Normocephalic and atraumatic. Eyes:  Sclera clear, no icterus.   Conjunctiva pink. Ears:  Normal auditory acuity. Nose:  No deformity, discharge, or lesions. Mouth:  No deformity  or lesions,oropharynx pink & moist. Neck:  Supple; no masses or thyromegaly. Lungs:  Respirations even and unlabored.  Clear throughout to auscultation.   No wheezes, crackles, or rhonchi. No acute distress. Heart:  Regular rate and rhythm; no murmurs, clicks, rubs, or gallops. Abdomen:  Normal bowel sounds. Soft, obese, non-tender and non-distended without masses, hepatosplenomegaly or hernias noted.  No guarding or rebound tenderness.   Rectal: Not performed Msk:  Symmetrical without gross deformities. Good, equal movement & strength bilaterally. Pulses:  Normal pulses noted. Extremities:  No clubbing or edema.  No cyanosis. Neurologic:  Alert and oriented x3;  grossly normal neurologically. Skin: Spider nevi on upper anterior chest wall, intact without significant lesions or rashes. No jaundice. Lymph Nodes:  No significant cervical adenopathy. Psych:  Alert and cooperative. Normal mood and affect.  Imaging  Studies: Reviewed  Assessment and Plan:   Richard Bean is a 69 y.o. pleasant Caucasian male with obesity, hypertension, CKD is seen in consultation for cirrhosis of liver.  Patient is at risk for fatty liver and progression to cirrhosis.  Hepatitis B surface antigen negative, HCV antibody negative, hepatitis B core antibody negative.  Secondary liver disease work-up of cirrhosis is negative  Cirrhosis secondary to fatty liver Secondary liver disease work-up is negative EGD did not reveal varices Ultrasound liver and Dopplers did not reveal any portal vein thrombosis, no evidence of liver lesions, AFP normal Repeat ultrasound every 6 months Discussed about lifestyle modification including diet and exercise, weight loss Strongly advised to quit drinking alcohol Discussed about smoking cessation, patient is not ready to quit yet  Erosive esophagitis and Barrett's esophagus without dysplasia (38cm to 43cm) Start omeprazole 40 mg twice daily before meals Antireflux  lifestyle Recommend EGD in 3 months to confirm healing of erosive esophagitis as well as repeat biopsies of long segment Barrett's at 1 cm interval.    Follow up in 3 to 4 months  [name], MD

## 2020-08-01 DIAGNOSIS — N2581 Secondary hyperparathyroidism of renal origin: Secondary | ICD-10-CM | POA: Diagnosis not present

## 2020-08-01 DIAGNOSIS — N1832 Chronic kidney disease, stage 3b: Secondary | ICD-10-CM | POA: Diagnosis not present

## 2020-08-01 DIAGNOSIS — I129 Hypertensive chronic kidney disease with stage 1 through stage 4 chronic kidney disease, or unspecified chronic kidney disease: Secondary | ICD-10-CM | POA: Diagnosis not present

## 2020-08-01 DIAGNOSIS — E871 Hypo-osmolality and hyponatremia: Secondary | ICD-10-CM | POA: Diagnosis not present

## 2020-08-27 ENCOUNTER — Telehealth: Payer: Self-pay

## 2020-08-27 NOTE — Telephone Encounter (Signed)
Called and left a message for call back  

## 2020-08-27 NOTE — Telephone Encounter (Signed)
-----   Message from Shelby Mattocks, [place] sent at 07/03/2020  2:44 PM EST ----- Scheduled for EGD in March

## 2020-08-27 NOTE — Telephone Encounter (Signed)
Patient left a voicemail on the front desk for call back. Called patient back and left a message for call back

## 2020-08-28 ENCOUNTER — Other Ambulatory Visit: Payer: Self-pay

## 2020-08-28 DIAGNOSIS — I8500 Esophageal varices without bleeding: Secondary | ICD-10-CM

## 2020-08-28 NOTE — Telephone Encounter (Signed)
Called patient and scheduled patient for 10/02/2020 went over instructions and sent them his mailing address. Updated referral

## 2020-08-29 ENCOUNTER — Telehealth (HOSPITAL_COMMUNITY): Payer: Medicare Other | Admitting: Psychiatry

## 2020-09-06 ENCOUNTER — Other Ambulatory Visit: Payer: Self-pay | Admitting: *Deleted

## 2020-09-06 DIAGNOSIS — Z87891 Personal history of nicotine dependence: Secondary | ICD-10-CM

## 2020-09-06 DIAGNOSIS — F17200 Nicotine dependence, unspecified, uncomplicated: Secondary | ICD-10-CM

## 2020-09-06 DIAGNOSIS — Z122 Encounter for screening for malignant neoplasm of respiratory organs: Secondary | ICD-10-CM

## 2020-09-06 NOTE — Progress Notes (Signed)
Contacted and scheduled for annual lung screening scan. Patient is a current smoker with a 71.5 pack year history.

## 2020-09-11 ENCOUNTER — Ambulatory Visit
Admission: RE | Admit: 2020-09-11 | Discharge: 2020-09-11 | Disposition: A | Discharge status: Home / Self Care | Payer: Medicare Other | Source: Ambulatory Visit | Attending: Oncology | Admitting: Oncology

## 2020-09-11 ENCOUNTER — Other Ambulatory Visit: Payer: Self-pay

## 2020-09-11 DIAGNOSIS — Z122 Encounter for screening for malignant neoplasm of respiratory organs: Secondary | ICD-10-CM

## 2020-09-11 DIAGNOSIS — Z87891 Personal history of nicotine dependence: Secondary | ICD-10-CM

## 2020-09-11 DIAGNOSIS — F17200 Nicotine dependence, unspecified, uncomplicated: Secondary | ICD-10-CM

## 2020-09-13 ENCOUNTER — Encounter: Payer: Self-pay | Admitting: *Deleted

## 2020-09-17 ENCOUNTER — Other Ambulatory Visit: Payer: Self-pay | Admitting: Physician Assistant

## 2020-09-17 DIAGNOSIS — F5101 Primary insomnia: Secondary | ICD-10-CM

## 2020-09-17 NOTE — Telephone Encounter (Signed)
Requested medication (s) are due for refill today: yes  Requested medication (s) are on the active medication list: yes  Last refill:  04/04/2020  Future visit scheduled: no  Notes to clinic:  This refill cannot be delegated   Requested Prescriptions  Pending Prescriptions Disp Refills   eszopiclone (LUNESTA) 1 MG TABS tablet [Pharmacy Med Name: ESZOPICLONE TABS 1MG ] 30 tablet 0    Sig: TAKE 1 TABLET AT BEDTIME AS NEEDED FOR SLEEP. TAKE IMMEDIATELY BEFORE BEDTIME      Not Delegated - Psychiatry:  Anxiolytics/Hypnotics Failed - 09/17/2020  8:56 AM      Failed - This refill cannot be delegated      Failed - Urine Drug Screen completed in last 360 days      Failed - Valid encounter within last 6 months    Recent Outpatient Visits           6 months ago Alcoholic cirrhosis of liver without ascites [facility])   [place], [place]   11 months ago COPD with exacerbation [facility])   [place], Vickki Muff, PA-C   1 year ago Hypertension, renal disease, stage 1-4 or unspecified chronic kidney disease   [facility] [place], Adriana M, [place]   2 years ago Cigarette nicotine dependence without complication   [place], [place]   2 years ago Annual physical exam   [facility] [place], Herbie [place], [place]       Future Appointments             In 8 months [place], Ronda Fairly, MD [place]

## 2020-09-26 ENCOUNTER — Other Ambulatory Visit: Payer: Self-pay

## 2020-09-26 ENCOUNTER — Telehealth (INDEPENDENT_AMBULATORY_CARE_PROVIDER_SITE_OTHER): Payer: Medicare Other | Admitting: Psychiatry

## 2020-09-26 ENCOUNTER — Encounter (HOSPITAL_COMMUNITY): Payer: Self-pay | Admitting: Psychiatry

## 2020-09-26 DIAGNOSIS — F4001 Agoraphobia with panic disorder: Secondary | ICD-10-CM

## 2020-09-26 MED ORDER — ESCITALOPRAM OXALATE 20 MG PO TABS: 20.0000 mg | ORAL_TABLET | Freq: Every day | ORAL | 1 refills | Status: DC

## 2020-09-26 NOTE — Progress Notes (Signed)
[facility] MD OP Progress Note  Virtual Visit via Telephone Note  I connected with Richard Bean on 09/26/20 at  9:40 AM EST by telephone and verified that I am speaking with the correct person using two identifiers.  Location: Patient: home Provider: Clinic   I discussed the limitations, risks, security and privacy concerns of performing an evaluation and management service by telephone and the availability of in person appointments. I also discussed with the patient that there may be a patient responsible charge related to this service. The patient expressed understanding and agreed to proceed.   I provided 08 minutes of non-face-to-face time during this encounter.     09/26/2020 9:42 AM Richard Bean  MRN:  [id number]  Chief Complaint: " I am doing great."   HPI: Patient reported he is doing well.  He stated that his mood has been stable.  He denied having any anxiety or panic attacks.  He informed that he recently bought a boat and he plans to go out of the water this weekend. He denied any other concerns or issues at this time.   Visit Diagnosis:    ICD-10-CM   1. Panic disorder with agoraphobia and mild panic attacks  F40.01     Past Psychiatric History: Panic d/o and agoraphobia  Past Medical History:  Past Medical History:  Diagnosis Date  . Actinic keratosis   . Anxiety   . Cancer ([place])   . Depression   . GERD (gastroesophageal reflux disease)   . H/O prostate cancer   . Heart murmur   . Panic disorder with agoraphobia     Past Surgical History:  Procedure Laterality Date  . ANKLE SURGERY Right   . ESOPHAGOGASTRODUODENOSCOPY (EGD) WITH PROPOFOL N/A 06/26/2020   Procedure: ESOPHAGOGASTRODUODENOSCOPY (EGD) WITH PROPOFOL;  Surgeon: Lin Landsman, MD;  Location: [place];  Service: Gastroenterology;  Laterality: N/A;  . PROSTATE SURGERY      Family Psychiatric History: denied  Family History:  Family History  Problem Relation Age of Onset  . COPD  Mother   . Prostate cancer Father   . Cancer - Lung Father   . Depression Sister   . Stomach cancer Sister   . Alcohol abuse Brother   . Heart block Brother   . Breast cancer Sister   . Prostate cancer Brother   . Skin cancer Brother     Social History:  Social History   Socioeconomic History  . Marital status: Married    Spouse name: Not on file  . Number of children: Not on file  . Years of education: Not on file  . Highest education level: Not on file  Occupational History  . Not on file  Tobacco Use  . Smoking status: Current Every Day Smoker    Packs/day: 1.00    Years: 47.00    Pack years: 47.00    Types: Cigarettes    Start date: 05/06/1971  . Smokeless tobacco: Never Used  Vaping Use  . Vaping Use: Never used  Substance and Sexual Activity  . Alcohol use: Yes    Alcohol/week: 16.0 standard drinks    Types: 6 Cans of beer, 10 Shots of liquor per week    Comment: in 1 to 2 times a week   . Drug use: No  . Sexual activity: Yes  Other Topics Concern  . Not on file  Social History Narrative  . Not on file   Social Determinants of Health   Financial Resource Strain:  Not on file  Food Insecurity: Not on file  Transportation Needs: Not on file  Physical Activity: Not on file  Stress: Not on file  Social Connections: Not on file    Allergies:  Allergies  Allergen Reactions  . Chlorpheniramine-Phenylephrine     Metabolic Disorder Labs: Lab Results  Component Value Date   HGBA1C 5.4 05/03/2020   No results found for: PROLACTIN Lab Results  Component Value Date   CHOL 223 (H) 05/03/2020   TRIG 88 05/03/2020   HDL 61 05/03/2020   CHOLHDL 3.8 10/16/2019   LDLCALC 147 (H) 05/03/2020   LDLCALC 127 (H) 10/16/2019   Lab Results  Component Value Date   TSH 1.270 03/09/2019    Therapeutic Level Labs: No results found for: LITHIUM No results found for: VALPROATE No components found for:  CBMZ  Current Medications: Current Outpatient  Medications  Medication Sig Dispense Refill  . amLODipine (NORVASC) 5 MG tablet Take 5 mg by mouth daily.    . enalapril (VASOTEC) 5 MG tablet Take 5 mg by mouth daily.    Marland Kitchen escitalopram (LEXAPRO) 20 MG tablet Take 1 tablet (20 mg total) by mouth daily. 90 tablet 1  . eszopiclone (LUNESTA) 1 MG TABS tablet TAKE 1 TABLET AT BEDTIME AS NEEDED FOR SLEEP. TAKE IMMEDIATELY BEFORE BEDTIME 30 tablet 5  . imipramine (TOFRANIL) 25 MG tablet Take 1 tablet (25 mg total) by mouth at bedtime. 90 tablet 2  . omeprazole (PRILOSEC) 40 MG capsule Take 1 capsule (40 mg total) by mouth 2 (two) times daily before a meal. 180 capsule 2  . omeprazole (PRILOSEC) 40 MG capsule Take 1 capsule (40 mg total) by mouth 2 (two) times daily before a meal. 60 capsule 0  . sildenafil (REVATIO) 20 MG tablet Take 20 mg by mouth as directed.     No current facility-administered medications for this visit.    Musculoskeletal: Strength & Muscle Tone: unable to assess due to telemed visit Gait & Station: unable to assess due to telemed visit Patient leans: unable to assess due to telemed visit   Psychiatric Specialty Exam: ROS  There were no vitals taken for this visit.There is no height or weight on file to calculate BMI.  General Appearance: unable to assess due to phone visit  Eye Contact:  unable to assess due to phone visit    Speech:  Clear and Coherent and Normal Rate  Volume:  Normal  Mood:  Euthymic  Affect:  Appropriate  Thought Process:  Goal Directed, Linear and Descriptions of Associations: Intact  Orientation:  Full (Time, Place, and Person)  Thought Content: Logical   Suicidal Thoughts:  No  Homicidal Thoughts:  No  Memory:  Recent;   Good Remote;   Good  Judgement:  Fair  Insight:  Fair  Psychomotor Activity:  Normal  Concentration:  Concentration: Good and Attention Span: Good  Recall:  Good  Fund of Knowledge: Good  Language: Good  Akathisia:  Negative  Handed:  Right  AIMS (if indicated):  not done  Assets:  Communication Skills Desire for Improvement Financial Resources/Insurance Housing Social Support Talents/Skills Transportation  ADL's:  Intact  Cognition: WNL  Sleep:  Good   Screenings: AUDIT   Flowsheet Row Clinical Support from 04/04/2020 in [place]  Alcohol Use Disorder Identification Test Final Score (AUDIT) 4    PHQ2-9   Flowsheet Row Clinical Support from 04/04/2020 in [place] Visit from 03/07/2018 in [place] Visit from  10/12/2016 in [place] Visit from 06/17/2015 in [place]  PHQ-2 Total Score 2 6 0 0  PHQ-9 Total Score 7 12 -- --       Assessment and Plan: Patient remains stable on his current regimen.  1. Panic disorder with agoraphobia and mild panic attacks  Continue escitalopram (LEXAPRO) 20 MG tablet; Take 1 tablet (20 mg total) by mouth daily.  Dispense: 90 tablet; Refill: 1  Follow up in 4 months.   [place] Crane, MD 09/26/2020, 9:42 AM

## 2020-09-30 ENCOUNTER — Other Ambulatory Visit: Payer: Self-pay

## 2020-09-30 ENCOUNTER — Other Ambulatory Visit
Admission: RE | Admit: 2020-09-30 | Discharge: 2020-09-30 | Disposition: A | Discharge status: Home / Self Care | Payer: Medicare Other | Source: Ambulatory Visit | Attending: Gastroenterology | Admitting: Gastroenterology

## 2020-09-30 DIAGNOSIS — Z01812 Encounter for preprocedural laboratory examination: Secondary | ICD-10-CM | POA: Insufficient documentation

## 2020-09-30 DIAGNOSIS — Z20822 Contact with and (suspected) exposure to covid-19: Secondary | ICD-10-CM | POA: Diagnosis not present

## 2020-09-30 LAB — SARS CORONAVIRUS 2 (TAT 6-24 HRS): SARS Coronavirus 2: NEGATIVE

## 2020-10-01 ENCOUNTER — Encounter: Payer: Self-pay | Admitting: Gastroenterology

## 2020-10-02 ENCOUNTER — Ambulatory Visit: Payer: Medicare Other | Admitting: Anesthesiology

## 2020-10-02 ENCOUNTER — Encounter
Admission: RE | Disposition: A | Discharge status: Home / Self Care | Payer: Self-pay | Source: Home / Self Care | Attending: Gastroenterology

## 2020-10-02 ENCOUNTER — Encounter: Payer: Self-pay | Admitting: Gastroenterology

## 2020-10-02 ENCOUNTER — Other Ambulatory Visit: Payer: Self-pay

## 2020-10-02 ENCOUNTER — Ambulatory Visit
Admission: RE | Admit: 2020-10-02 | Discharge: 2020-10-02 | Disposition: A | Discharge status: Home / Self Care | Payer: Medicare Other | Attending: Gastroenterology | Admitting: Gastroenterology

## 2020-10-02 DIAGNOSIS — Z803 Family history of malignant neoplasm of breast: Secondary | ICD-10-CM | POA: Insufficient documentation

## 2020-10-02 DIAGNOSIS — F17210 Nicotine dependence, cigarettes, uncomplicated: Secondary | ICD-10-CM | POA: Insufficient documentation

## 2020-10-02 DIAGNOSIS — Z8042 Family history of malignant neoplasm of prostate: Secondary | ICD-10-CM | POA: Diagnosis not present

## 2020-10-02 DIAGNOSIS — N183 Chronic kidney disease, stage 3 unspecified: Secondary | ICD-10-CM | POA: Diagnosis not present

## 2020-10-02 DIAGNOSIS — I129 Hypertensive chronic kidney disease with stage 1 through stage 4 chronic kidney disease, or unspecified chronic kidney disease: Secondary | ICD-10-CM | POA: Diagnosis not present

## 2020-10-02 DIAGNOSIS — Z800 Family history of malignant neoplasm of digestive organs: Secondary | ICD-10-CM | POA: Diagnosis not present

## 2020-10-02 DIAGNOSIS — Z8546 Personal history of malignant neoplasm of prostate: Secondary | ICD-10-CM | POA: Diagnosis not present

## 2020-10-02 DIAGNOSIS — Z8249 Family history of ischemic heart disease and other diseases of the circulatory system: Secondary | ICD-10-CM | POA: Diagnosis not present

## 2020-10-02 DIAGNOSIS — Z825 Family history of asthma and other chronic lower respiratory diseases: Secondary | ICD-10-CM | POA: Diagnosis not present

## 2020-10-02 DIAGNOSIS — I8510 Secondary esophageal varices without bleeding: Secondary | ICD-10-CM | POA: Diagnosis not present

## 2020-10-02 DIAGNOSIS — E782 Mixed hyperlipidemia: Secondary | ICD-10-CM | POA: Diagnosis not present

## 2020-10-02 DIAGNOSIS — Z801 Family history of malignant neoplasm of trachea, bronchus and lung: Secondary | ICD-10-CM | POA: Insufficient documentation

## 2020-10-02 DIAGNOSIS — K219 Gastro-esophageal reflux disease without esophagitis: Secondary | ICD-10-CM | POA: Diagnosis not present

## 2020-10-02 DIAGNOSIS — Z79899 Other long term (current) drug therapy: Secondary | ICD-10-CM | POA: Diagnosis not present

## 2020-10-02 DIAGNOSIS — I8500 Esophageal varices without bleeding: Secondary | ICD-10-CM

## 2020-10-02 DIAGNOSIS — Z808 Family history of malignant neoplasm of other organs or systems: Secondary | ICD-10-CM | POA: Insufficient documentation

## 2020-10-02 DIAGNOSIS — Z888 Allergy status to other drugs, medicaments and biological substances status: Secondary | ICD-10-CM | POA: Diagnosis not present

## 2020-10-02 DIAGNOSIS — K2270 Barrett's esophagus without dysplasia: Secondary | ICD-10-CM | POA: Diagnosis not present

## 2020-10-02 HISTORY — PX: ESOPHAGOGASTRODUODENOSCOPY (EGD) WITH PROPOFOL: SHX5813

## 2020-10-02 SURGERY — ESOPHAGOGASTRODUODENOSCOPY (EGD) WITH PROPOFOL
Anesthesia: General

## 2020-10-02 MED ORDER — LIDOCAINE HCL (PF) 2 % IJ SOLN
INTRAMUSCULAR | Status: AC
  Filled 2020-10-02: qty 5

## 2020-10-02 MED ORDER — GLYCOPYRROLATE 0.2 MG/ML IJ SOLN
INTRAMUSCULAR | Status: AC
  Filled 2020-10-02: qty 2

## 2020-10-02 MED ORDER — PROPOFOL 500 MG/50ML IV EMUL
INTRAVENOUS | Status: DC | PRN
  Administered 2020-10-02: 120 ug/kg/min via INTRAVENOUS

## 2020-10-02 MED ORDER — LIDOCAINE 2% (20 MG/ML) 5 ML SYRINGE
INTRAMUSCULAR | Status: DC | PRN
  Administered 2020-10-02: 25 mg via INTRAVENOUS

## 2020-10-02 MED ORDER — SODIUM CHLORIDE 0.9 % IV SOLN: INTRAVENOUS | Status: DC

## 2020-10-02 MED ORDER — PROPOFOL 500 MG/50ML IV EMUL
INTRAVENOUS | Status: AC
  Filled 2020-10-02: qty 100

## 2020-10-02 MED ORDER — PROPOFOL 10 MG/ML IV BOLUS
INTRAVENOUS | Status: DC | PRN
  Administered 2020-10-02: 70 mg via INTRAVENOUS
  Administered 2020-10-02: 30 mg via INTRAVENOUS

## 2020-10-02 MED ORDER — GLYCOPYRROLATE 0.2 MG/ML IJ SOLN
INTRAMUSCULAR | Status: DC | PRN
  Administered 2020-10-02: .2 mg via INTRAVENOUS

## 2020-10-02 NOTE — Anesthesia Postprocedure Evaluation (Signed)
Anesthesia Post Note  Patient: Richard Bean  Procedure(s) Performed: ESOPHAGOGASTRODUODENOSCOPY (EGD) WITH PROPOFOL (N/A )  Patient location during evaluation: Endoscopy Anesthesia Type: General Level of consciousness: awake and alert and oriented Pain management: pain level controlled Vital Signs Assessment: post-procedure vital signs reviewed and stable Respiratory status: spontaneous breathing, nonlabored ventilation and respiratory function stable Cardiovascular status: blood pressure returned to baseline and stable Postop Assessment: no signs of nausea or vomiting Anesthetic complications: no   No complications documented.   Last Vitals:  Vitals:   10/02/20 0903 10/02/20 0913  BP: 121/69 121/75  Pulse: 93 88  Resp: 20 12  Temp:    SpO2: 94% 95%    Last Pain:  Vitals:   10/02/20 0913  TempSrc:   PainSc: 0-No pain                 Kensy Blizard

## 2020-10-02 NOTE — Op Note (Signed)
[facility] Gastroenterology Patient Name: Richard Bean Procedure Date: 10/02/2020 8:23 AM MRN: [id number] Account #: [id number] Date of Birth: 1951/03/01 Admit Type: Outpatient Age: 70 Room: [facility] ENDO ROOM 4 Gender: Male Note Status: Finalized Procedure:             Upper GI endoscopy Indications:           Surveillance for malignancy due to personal history of                         Barrett's esophagus, Follow-up of esophagitis Providers:             Lin Landsman MD, MD Medicines:             General Anesthesia Complications:         No immediate complications. Estimated blood loss:                         Minimal. Procedure:             Pre-Anesthesia Assessment:                        - Prior to the procedure, a History and Physical was                         performed, and patient medications and allergies were                         reviewed. The patient is competent. The risks and                         benefits of the procedure and the sedation options and                         risks were discussed with the patient. All questions                         were answered and informed consent was obtained.                         Patient identification and proposed procedure were                         verified by the physician, the nurse, the                         anesthesiologist, the anesthetist and the technician                         in the pre-procedure area in the procedure room in the                         endoscopy suite. Mental Status Examination: alert and                         oriented. Airway Examination: normal oropharyngeal                         airway and neck mobility. Respiratory Examination:  clear to auscultation. CV Examination: normal.                         Prophylactic Antibiotics: The patient does not require                         prophylactic antibiotics. Prior Anticoagulants: The                          patient has taken no previous anticoagulant or                         antiplatelet agents. ASA Grade Assessment: III - A                         patient with severe systemic disease. After reviewing                         the risks and benefits, the patient was deemed in                         satisfactory condition to undergo the procedure. The                         anesthesia plan was to use general anesthesia.                         Immediately prior to administration of medications,                         the patient was re-assessed for adequacy to receive                         sedatives. The heart rate, respiratory rate, oxygen                         saturations, blood pressure, adequacy of pulmonary                         ventilation, and response to care were monitored                         throughout the procedure. The physical status of the                         patient was re-assessed after the procedure.                        After obtaining informed consent, the endoscope was                         passed under direct vision. Throughout the procedure,                         the patient's blood pressure, pulse, and oxygen                         saturations were monitored continuously. The Endoscope  was introduced through the mouth, and advanced to the                         second part of duodenum. The upper GI endoscopy was                         accomplished without difficulty. The patient tolerated                         the procedure well. Findings:      The duodenal bulb and second portion of the duodenum were normal.      The entire examined stomach was normal.      The cardia and gastric fundus were normal on retroflexion.      The esophagus and gastroesophageal junction were examined with white       light from a forward view and retroflexed position. There were       esophageal mucosal changes secondary to  established short-segment       Barrett's disease. These changes involved the mucosa at the upper extent       of the gastric folds (41 cm from the incisors) extending to the Z-line       (38 cm from the incisors). Circumferential salmon-colored mucosa was       present from 39 to 41 cm, scattered islands of salmon-colored mucosa       were present from 38 to 41 cm and no visible abnormalities were present.       The maximum longitudinal extent of these esophageal mucosal changes was       3 cm in length. Mucosa was biopsied with a cold forceps for histology. A       total of 4 specimen bottles were sent to pathology. Estimated blood loss       was minimal. Impression:            - Normal duodenal bulb and second portion of the                         duodenum.                        - Normal stomach.                        - Esophageal mucosal changes secondary to established                         short-segment Barrett's disease. Biopsied. Recommendation:        - Discharge patient to home (with escort).                        - Resume previous diet today.                        - Continue present medications.                        - Follow an antireflux regimen indefinitely.                        - Await pathology results. Procedure Code(s):     --- Professional ---  73428, Esophagogastroduodenoscopy, flexible,                         transoral; with biopsy, single or multiple Diagnosis Code(s):     --- Professional ---                        K22.70, Barrett's esophagus without dysplasia                        K20.90, Esophagitis, unspecified without bleeding CPT copyright 2019 American Medical Association. All rights reserved. The codes documented in this report are preliminary and upon coder review may  be revised to meet current compliance requirements. Dr. Ulyess Mort Lin Landsman MD, MD 10/02/2020 8:45:43 AM This report has been signed  electronically. Number of Addenda: 0 Note Initiated On: 10/02/2020 8:23 AM Estimated Blood Loss:  Estimated blood loss was minimal.      [facility]

## 2020-10-02 NOTE — Transfer of Care (Signed)
Immediate Anesthesia Transfer of Care Note  Patient: Richard Bean  Procedure(s) Performed: ESOPHAGOGASTRODUODENOSCOPY (EGD) WITH PROPOFOL (N/A )  Patient Location: Endoscopy Unit  Anesthesia Type:General  Level of Consciousness: awake and drowsy  Airway & Oxygen Therapy: Patient Spontanous Breathing  Post-op Assessment: Report given to RN and Post -op Vital signs reviewed and stable  Post vital signs: Reviewed  Last Vitals:  Vitals Value Taken Time  BP 134/73 10/02/20 0843  Temp 36.2 C 10/02/20 0843  Pulse 87 10/02/20 0844  Resp 22 10/02/20 0844  SpO2 93 % 10/02/20 0844  Vitals shown include unvalidated device data.  Last Pain:  Vitals:   10/02/20 0843  TempSrc: Temporal         Complications: No complications documented.

## 2020-10-02 NOTE — H&P (Signed)
[name], MD [address]  [place]  [place], [place] [address]  Main: [phone]  Fax: [phone] Pager: [phone]  Primary Care Physician:  Mar Daring, PA-C Primary Gastroenterologist:  Dr. [name]  Pre-Procedure History & Physical: HPI:  Richard Bean is a 70 y.o. male is here for an endoscopy.   Past Medical History:  Diagnosis Date  . Actinic keratosis   . Anxiety   . Cancer ([place])   . Depression   . GERD (gastroesophageal reflux disease)   . H/O prostate cancer   . Heart murmur   . Panic disorder with agoraphobia     Past Surgical History:  Procedure Laterality Date  . ANKLE SURGERY Right   . ESOPHAGOGASTRODUODENOSCOPY (EGD) WITH PROPOFOL N/A 06/26/2020   Procedure: ESOPHAGOGASTRODUODENOSCOPY (EGD) WITH PROPOFOL;  Surgeon: Lin Landsman, MD;  Location: [place];  Service: Gastroenterology;  Laterality: N/A;  . PROSTATE SURGERY      Prior to Admission medications   Medication Sig Start Date End Date Taking? Authorizing Provider  amLODipine (NORVASC) 5 MG tablet Take 5 mg by mouth daily.   Yes [provider]  enalapril (VASOTEC) 5 MG tablet Take 5 mg by mouth daily.   Yes [provider]  escitalopram (LEXAPRO) 20 MG tablet Take 1 tablet (20 mg total) by mouth daily. 09/26/20  Yes [place] Crane, MD  imipramine (TOFRANIL) 25 MG tablet Take 1 tablet (25 mg total) by mouth at bedtime. 06/03/20  Yes Stoio[place], Ronda Fairly, MD  omeprazole (PRILOSEC) 40 MG capsule Take 1 capsule (40 mg total) by mouth 2 (two) times daily before a meal. 07/03/20  Yes Samanvi Cuccia, Tally Due, MD  eszopiclone (LUNESTA) 1 MG TABS tablet TAKE 1 TABLET AT BEDTIME AS NEEDED FOR SLEEP. TAKE IMMEDIATELY BEFORE BEDTIME 09/17/20   Mar Daring, PA-C  omeprazole (PRILOSEC) 40 MG capsule Take 1 capsule (40 mg total) by mouth 2 (two) times daily before a meal. 07/03/20 10/01/20  Adora Yeh, Tally Due, MD  sildenafil (REVATIO) 20 MG tablet Take 20 mg  by mouth as directed.    [provider]    Allergies as of 08/28/2020 - Review Complete 07/03/2020  Allergen Reaction Noted  . Chlorpheniramine-phenylephrine  01/31/2015    Family History  Problem Relation Age of Onset  . COPD Mother   . Prostate cancer Father   . Cancer - Lung Father   . Depression Sister   . Stomach cancer Sister   . Alcohol abuse Brother   . Heart block Brother   . Breast cancer Sister   . Prostate cancer Brother   . Skin cancer Brother     Social History   Socioeconomic History  . Marital status: Married    Spouse name: Not on file  . Number of children: Not on file  . Years of education: Not on file  . Highest education level: Not on file  Occupational History  . Not on file  Tobacco Use  . Smoking status: Current Every Day Smoker    Packs/day: 1.00    Years: 47.00    Pack years: 47.00    Types: Cigarettes    Start date: 05/06/1971  . Smokeless tobacco: Never Used  Vaping Use  . Vaping Use: Never used  Substance and Sexual Activity  . Alcohol use: Yes    Alcohol/week: 16.0 standard drinks    Types: 6 Cans of beer, 10 Shots of liquor per week    Comment: in 1 to 2  times a week   . Drug use: No  . Sexual activity: Yes  Other Topics Concern  . Not on file  Social History Narrative  . Not on file   Social Determinants of Health   Financial Resource Strain: Not on file  Food Insecurity: Not on file  Transportation Needs: Not on file  Physical Activity: Not on file  Stress: Not on file  Social Connections: Not on file  Intimate Partner Violence: Not on file    Review of Systems: See HPI, otherwise negative ROS  Physical Exam: BP 139/76   Pulse 86   Temp 97.7 F (36.5 C) (Temporal)   Resp 20   Ht 6' (1.829 m)   Wt 100.2 kg   SpO2 98%   BMI 29.97 kg/m  General:   Alert,  pleasant and cooperative in NAD Head:  Normocephalic and atraumatic. Neck:  Supple; no masses or thyromegaly. Lungs:  Clear throughout to  auscultation.    Heart:  Regular rate and rhythm. Abdomen:  Soft, nontender and nondistended. Normal bowel sounds, without guarding, and without rebound.   Neurologic:  Alert and  oriented x4;  grossly normal neurologically.  Impression/Plan: Richard Bean is here for an endoscopy to be performed for erosive esophagitis, barrett;s esophagus  Risks, benefits, limitations, and alternatives regarding  endoscopy have been reviewed with the patient.  Questions have been answered.  All parties agreeable.   Sherri Sear, MD  10/02/2020, 8:21 AM

## 2020-10-02 NOTE — Anesthesia Preprocedure Evaluation (Signed)
Anesthesia Evaluation  Patient identified by MRN, date of birth, ID band Patient awake    Reviewed: Allergy & Precautions, NPO status , Patient's Chart, lab work & pertinent test results  History of Anesthesia Complications Negative for: history of anesthetic complications  Airway Mallampati: II  TM Distance: >3 FB Neck ROM: Full    Dental  (+) Upper Dentures, Lower Dentures   Pulmonary neg sleep apnea, neg COPD, Current SmokerPatient did not abstain from smoking.,    breath sounds clear to auscultation- rhonchi (-) wheezing      Cardiovascular Exercise Tolerance: Good (-) hypertension(-) CAD, (-) Past MI, (-) Cardiac Stents and (-) CABG  Rhythm:Regular Rate:Normal - Systolic murmurs and - Diastolic murmurs    Neuro/Psych neg Seizures PSYCHIATRIC DISORDERS Anxiety Depression negative neurological ROS     GI/Hepatic GERD  ,(+) Cirrhosis   Esophageal Varices    ,   Endo/Other  negative endocrine ROSneg diabetes  Renal/GU CRFRenal disease     Musculoskeletal  (+) Arthritis ,   Abdominal (+) - obese,   Peds  Hematology negative hematology ROS (+)   Anesthesia Other Findings Past Medical History: No date: Actinic keratosis No date: Anxiety No date: Cancer ([place]) No date: Depression No date: GERD (gastroesophageal reflux disease) No date: H/O prostate cancer No date: Heart murmur No date: Panic disorder with agoraphobia   Reproductive/Obstetrics                             Anesthesia Physical Anesthesia Plan  ASA: III  Anesthesia Plan: General   Post-op Pain Management:    Induction: Intravenous  PONV Risk Score and Plan: 0 and Propofol infusion  Airway Management Planned: Natural Airway  Additional Equipment:   Intra-op Plan:   Post-operative Plan:   Informed Consent: I have reviewed the patients History and Physical, chart, labs and discussed the procedure including the  risks, benefits and alternatives for the proposed anesthesia with the patient or authorized representative who has indicated his/her understanding and acceptance.     Dental advisory given  Plan Discussed with: CRNA and Anesthesiologist  Anesthesia Plan Comments:         Anesthesia Quick Evaluation

## 2020-10-03 LAB — SURGICAL PATHOLOGY

## 2020-10-04 ENCOUNTER — Encounter: Payer: Self-pay | Admitting: Gastroenterology

## 2020-10-07 ENCOUNTER — Telehealth: Payer: Self-pay

## 2020-10-07 NOTE — Telephone Encounter (Signed)
Patient called and verbalized understanding of results. Put recall in for 2 years

## 2020-10-07 NOTE — Telephone Encounter (Signed)
-----   Message from Lin Landsman, MD sent at 10/04/2020  3:27 PM EST ----- Biopsies confirm Barrett's esophagus with no evidence of any precancerous lesions.  He can decrease omeprazole to 40 mg once a day before breakfast and this will be his long-term medication given history of Barrett's esophagus  Recall for EGD, for Barrett's surveillance in 2 years  [place]

## 2020-12-11 ENCOUNTER — Other Ambulatory Visit: Payer: Self-pay

## 2020-12-11 ENCOUNTER — Ambulatory Visit (INDEPENDENT_AMBULATORY_CARE_PROVIDER_SITE_OTHER): Payer: Medicare Other | Admitting: Dermatology

## 2020-12-11 DIAGNOSIS — D1800 Hemangioma unspecified site: Secondary | ICD-10-CM

## 2020-12-11 DIAGNOSIS — D229 Melanocytic nevi, unspecified: Secondary | ICD-10-CM

## 2020-12-11 DIAGNOSIS — Z1283 Encounter for screening for malignant neoplasm of skin: Secondary | ICD-10-CM | POA: Diagnosis not present

## 2020-12-11 DIAGNOSIS — L578 Other skin changes due to chronic exposure to nonionizing radiation: Secondary | ICD-10-CM

## 2020-12-11 DIAGNOSIS — L570 Actinic keratosis: Secondary | ICD-10-CM | POA: Diagnosis not present

## 2020-12-11 DIAGNOSIS — L918 Other hypertrophic disorders of the skin: Secondary | ICD-10-CM

## 2020-12-11 DIAGNOSIS — D692 Other nonthrombocytopenic purpura: Secondary | ICD-10-CM

## 2020-12-11 DIAGNOSIS — L821 Other seborrheic keratosis: Secondary | ICD-10-CM | POA: Diagnosis not present

## 2020-12-11 DIAGNOSIS — L814 Other melanin hyperpigmentation: Secondary | ICD-10-CM

## 2020-12-11 NOTE — Progress Notes (Signed)
Follow-Up Visit   Subjective  Richard Bean is a 70 y.o. male who presents for the following: Total body skin exam (Hx of AKs). The patient presents for Total-Body Skin Exam (TBSE) for skin cancer screening and mole check.  The following portions of the chart were reviewed this encounter and updated as appropriate:   Tobacco  Allergies  Meds  Problems  Med Hx  Surg Hx  Fam Hx     Review of Systems:  No other skin or systemic complaints except as noted in HPI or Assessment and Plan.  Objective  Well appearing patient in no apparent distress; mood and affect are within normal limits.  A full examination was performed including scalp, head, eyes, ears, nose, lips, neck, chest, axillae, abdomen, back, buttocks, bilateral upper extremities, bilateral lower extremities, hands, feet, fingers, toes, fingernails, and toenails. All findings within normal limits unless otherwise noted below.  Objective  Scalp x 15 (15): Pink scaly macules    Assessment & Plan    Lentigines - Scattered tan macules - Due to sun exposure - Benign-appering, observe - Recommend daily broad spectrum sunscreen SPF 30+ to sun-exposed areas, reapply every 2 hours as needed. - Call for any changes  Seborrheic Keratoses - Stuck-on, waxy, tan-brown papules and/or plaques  - Benign-appearing - Discussed benign etiology and prognosis. - Observe - Call for any changes  Melanocytic Nevi - Tan-brown and/or pink-flesh-colored symmetric macules and papules - Benign appearing on exam today - Observation - Call clinic for new or changing moles - Recommend daily use of broad spectrum spf 30+ sunscreen to sun-exposed areas.   Hemangiomas - Red papules - Discussed benign nature - Observe - Call for any changes  Actinic Damage - Severe, confluent actinic changes with pre-cancerous actinic keratoses  - Severe, chronic, not at goal, secondary to cumulative UV radiation exposure over time - diffuse scaly  erythematous macules and papules with underlying dyspigmentation - Discussed Prescription "Field Treatment" for Severe, Chronic Confluent Actinic Changes with Pre-Cancerous Actinic Keratoses Field treatment involves treatment of an entire area of skin that has confluent Actinic Changes (Sun/ Ultraviolet light damage) and PreCancerous Actinic Keratoses by method of PhotoDynamic Therapy (PDT) and/or prescription Topical Chemotherapy agents such as 5-fluorouracil, 5-fluorouracil/calcipotriene, and/or imiquimod.  The purpose is to decrease the number of clinically evident and subclinical PreCancerous lesions to prevent progression to development of skin cancer by chemically destroying early precancer changes that may or may not be visible.  It has been shown to reduce the risk of developing skin cancer in the treated area. As a result of treatment, redness, scaling, crusting, and open sores may occur during treatment course. One or more than one of these methods may be used and may have to be used several times to control, suppress and eliminate the PreCancerous changes. Discussed treatment course, expected reaction, and possible side effects. - Recommend daily broad spectrum sunscreen SPF 30+ to sun-exposed areas, reapply every 2 hours as needed.  - Staying in the shade or wearing long sleeves, sun glasses (UVA+UVB protection) and wide brim hats (4-inch brim around the entire circumference of the hat) are also recommended. - Call for new or changing lesions. - Plan PDT with ALA on scalp in 1 month.  Skin cancer screening performed today.  Purpura - Chronic; persistent and recurrent.  Treatable, but not curable. - Violaceous macules and patches - Benign - Related to trauma, age, sun damage and/or use of blood thinners, chronic use of topical and/or oral steroids -  Observe - Can use OTC arnica containing moisturizer such as Dermend Bruise Formula if desired - Call for worsening or other  concerns  Acrochordons (Skin Tags) - Fleshy, skin-colored pedunculated papules - Benign appearing.  - Observe. - If desired, they can be removed with an in office procedure that is not covered by insurance. - Please call the clinic if you notice any new or changing lesions.  AK (actinic keratosis) (15) Scalp x 15  Destruction of lesion - Scalp x 15 Complexity: simple   Destruction method: cryotherapy   Informed consent: discussed and consent obtained   Timeout:  patient name, date of birth, surgical site, and procedure verified Lesion destroyed using liquid nitrogen: Yes   Region frozen until ice ball extended beyond lesion: Yes   Outcome: patient tolerated procedure well with no complications   Post-procedure details: wound care instructions given    Skin cancer screening  Return in about 1 month (around 01/11/2021) for PDT to scalp, 31m AK f/u.  I, Othelia Pulling, RMA, am acting as scribe for Sarina Ser, MD .  Documentation: I have reviewed the above documentation for accuracy and completeness, and I agree with the above.  Sarina Ser, MD

## 2020-12-11 NOTE — Patient Instructions (Signed)

## 2020-12-20 ENCOUNTER — Encounter: Payer: Self-pay | Admitting: Dermatology

## 2021-01-15 ENCOUNTER — Ambulatory Visit (INDEPENDENT_AMBULATORY_CARE_PROVIDER_SITE_OTHER): Payer: Medicare Other

## 2021-01-15 ENCOUNTER — Other Ambulatory Visit: Payer: Self-pay

## 2021-01-15 DIAGNOSIS — L570 Actinic keratosis: Secondary | ICD-10-CM | POA: Diagnosis not present

## 2021-01-15 MED ORDER — AMINOLEVULINIC ACID HCL 20 % EX SOLR
1.0000 | Freq: Once | CUTANEOUS | Status: AC
Start: 2021-01-15 — End: 2021-01-15
  Administered 2021-01-15: 16:00:00 354 mg via TOPICAL

## 2021-01-15 NOTE — Patient Instructions (Signed)

## 2021-01-15 NOTE — Progress Notes (Signed)
Patient completed PDT therapy today.  1. AK (actinic keratosis) Scalp  Aminolevulinic Acid HCl 20 % SOLR 354 mg - Scalp   Photodynamic therapy - Scalp Procedure discussed: discussed risks, benefits, side effects. and alternatives   Prep: site scrubbed/prepped with acetone   Location:  Scalp Number of lesions:  Multiple Type of treatment:  Blue light Aminolevulinic Acid (see MAR for details): Levulan Number of Levulan sticks used:  1 Incubation time (minutes):  120 Number of minutes under lamp:  16 Number of seconds under lamp:  40 Cooling:  Floor fan Outcome: patient tolerated procedure well with no complications   Post-procedure details: sunscreen applied

## 2021-01-20 NOTE — Progress Notes (Signed)
Virtual Visit via Video Note  I connected with Richard Bean on 01/23/21 at  3:00 PM EDT by a video enabled telemedicine application and verified that I am speaking with the correct person using two identifiers.  Location: Patient: home Provider: office Persons participated in the visit- patient, provider    I discussed the limitations of evaluation and management by telemedicine and the availability of in person appointments. The patient expressed understanding and agreed to proceed.    I discussed the assessment and treatment plan with the patient. The patient was provided an opportunity to ask questions and all were answered. The patient agreed with the plan and demonstrated an understanding of the instructions.   The patient was advised to call back or seek an in-person evaluation if the symptoms worsen or if the condition fails to improve as anticipated.  I provided 26 minutes of non-face-to-face time during this encounter.   Norman Clay, MD     [facility] MD/PA/NP OP Progress Note  01/23/2021 3:55 PM Richard Bean  MRN:  [id number]  Chief Complaint:  Chief Complaint   Follow-up; Anxiety    HPI:  Richard Bean is a 70 y.o. year old male with a history of anxiety,CKD stage III B secondary to chronic over use of aspirin and NSAID, secondary hyperparathyroidism,  hypertension, cirrhosis of liver secondary to fatty liver, history of prostate cancer,  who is transferred from Dr. [facility].   He states that he has been doing well.  He enjoys taking a walk, going to the lake, and shooting pool.  He has been active.  He started to have a panic attack on the way to [facility] in 2003.  He has been doing much better since started on Lexapro.  He has not had panic attacks for the past few years.  Although he feels anxious when he drives a new road, anticipating panic attack, it usually subsides when he takes off the road.  He feels mellow lately.  When he was asked about his diagnosis of  depression, he states that he has never been depressed except he felt down when he lost his family member.  He sleeps well.  He is on diet, which is instructed by his GI provider.  He has good concentration.  He denies irritability.  He denies SI.  He denies decreased need for sleep or euphonia.   Substance- He drinks 3-4 shots of liquor twice a week, he denies drug use  Medication- lexapro 20 mg daily, propranolol 10 mg as needed (he used it only a few times in the last several years)   Daily routine: go to lake, shoot pool, plays with his dog Exercise: walk 3 miles every day Employment: retired in 2006, Financial planner Support: brothers Household: by himself Marital status: separated, married twice Number of children: 1 daughter in [place], 83 yo He grew up in a farm [place]. He had a "fine" childhood. he loved his parents. He has 2 living brother. He eats lunch with them weekly, occasionally with his cousin as well   Visit Diagnosis:    ICD-10-CM   1. Panic disorder  F41.0 escitalopram (LEXAPRO) 20 MG tablet      Past Psychiatric History:  Outpatient: Dr. Gretel Acre, Dr[facility] Psychiatry admission: denies Previous suicide attempt: denies Past trials of medication: fluoxetine, sertraline, Xanax,  History of violence:    Past Medical History:  Past Medical History:  Diagnosis Date   Actinic keratosis    Anxiety    Cancer ([place])  Depression    GERD (gastroesophageal reflux disease)    H/O prostate cancer    Heart murmur    Panic disorder with agoraphobia     Past Surgical History:  Procedure Laterality Date   ANKLE SURGERY Right    ESOPHAGOGASTRODUODENOSCOPY (EGD) WITH PROPOFOL N/A 06/26/2020   Procedure: ESOPHAGOGASTRODUODENOSCOPY (EGD) WITH PROPOFOL;  Surgeon: Lin Landsman, MD;  Location: [facility] ENDOSCOPY;  Service: Gastroenterology;  Laterality: N/A;   ESOPHAGOGASTRODUODENOSCOPY (EGD) WITH PROPOFOL N/A 10/02/2020   Procedure: ESOPHAGOGASTRODUODENOSCOPY (EGD) WITH PROPOFOL;   Surgeon: Lin Landsman, MD;  Location: [facility] ENDOSCOPY;  Service: Gastroenterology;  Laterality: N/A;   PROSTATE SURGERY      Family Psychiatric History: denies  Family History:  Family History  Problem Relation Age of Onset   COPD Mother    Prostate cancer Father    Cancer - Lung Father    Depression Sister    Stomach cancer Sister    Alcohol abuse Brother    Heart block Brother    Breast cancer Sister    Prostate cancer Brother    Skin cancer Brother     Social History:  Social History   Socioeconomic History   Marital status: Married    Spouse name: Not on file   Number of children: Not on file   Years of education: Not on file   Highest education level: Not on file  Occupational History   Not on file  Tobacco Use   Smoking status: Every Day    Packs/day: 1.00    Years: 47.00    Pack years: 47.00    Types: Cigarettes    Start date: 05/06/1971   Smokeless tobacco: Never  Vaping Use   Vaping Use: Never used  Substance and Sexual Activity   Alcohol use: Yes    Alcohol/week: 16.0 standard drinks    Types: 6 Cans of beer, 10 Shots of liquor per week    Comment: in 1 to 2 times a week    Drug use: No   Sexual activity: Yes  Other Topics Concern   Not on file  Social History Narrative   Not on file   Social Determinants of Health   Financial Resource Strain: Not on file  Food Insecurity: Not on file  Transportation Needs: Not on file  Physical Activity: Not on file  Stress: Not on file  Social Connections: Not on file    Allergies:  Allergies  Allergen Reactions   Chlorpheniramine-Phenylephrine Other (See Comments)    Actifed Makes cold symptoms worse    Metabolic Disorder Labs: Lab Results  Component Value Date   HGBA1C 5.4 05/03/2020   No results found for: PROLACTIN Lab Results  Component Value Date   CHOL 223 (H) 05/03/2020   TRIG 88 05/03/2020   HDL 61 05/03/2020   CHOLHDL 3.8 10/16/2019   LDLCALC 147 (H) 05/03/2020   LDLCALC  127 (H) 10/16/2019   Lab Results  Component Value Date   TSH 1.270 03/09/2019    Therapeutic Level Labs: No results found for: LITHIUM No results found for: VALPROATE No components found for:  CBMZ  Current Medications: Current Outpatient Medications  Medication Sig Dispense Refill   propranolol (INDERAL) 10 MG tablet Take 10 mg by mouth daily as needed.     amLODipine (NORVASC) 5 MG tablet Take 5 mg by mouth daily.     enalapril (VASOTEC) 5 MG tablet Take 5 mg by mouth daily.     [START ON 03/27/2021] escitalopram (LEXAPRO) 20 MG  tablet Take 1 tablet (20 mg total) by mouth daily. 90 tablet 0   eszopiclone (LUNESTA) 1 MG TABS tablet TAKE 1 TABLET AT BEDTIME AS NEEDED FOR SLEEP. TAKE IMMEDIATELY BEFORE BEDTIME 30 tablet 5   imipramine (TOFRANIL) 25 MG tablet Take 1 tablet (25 mg total) by mouth at bedtime. 90 tablet 2   omeprazole (PRILOSEC) 40 MG capsule Take 1 capsule (40 mg total) by mouth 2 (two) times daily before a meal. 180 capsule 2   omeprazole (PRILOSEC) 40 MG capsule Take 1 capsule (40 mg total) by mouth 2 (two) times daily before a meal. 60 capsule 0   sildenafil (REVATIO) 20 MG tablet Take 20 mg by mouth as directed.     No current facility-administered medications for this visit.     Musculoskeletal: Strength & Muscle Tone:  N/A Gait & Station:  N/A Patient leans: N/A  Psychiatric Specialty Exam: Review of Systems  Psychiatric/Behavioral:  Negative for agitation, behavioral problems, confusion, decreased concentration, dysphoric mood, hallucinations, self-injury, sleep disturbance and suicidal ideas. The patient is nervous/anxious. The patient is not hyperactive.   All other systems reviewed and are negative.  There were no vitals taken for this visit.There is no height or weight on file to calculate BMI.  General Appearance: Fairly Groomed  Eye Contact:  Good  Speech:  Clear and Coherent  Volume:  Normal  Mood:   good  Affect:  Appropriate, Congruent, and Full  Range  Thought Process:  Coherent  Orientation:  Full (Time, Place, and Person)  Thought Content: Logical   Suicidal Thoughts:  No  Homicidal Thoughts:  No  Memory:  Immediate;   Good  Judgement:  Good  Insight:  Good  Psychomotor Activity:  Normal  Concentration:  Concentration: Good and Attention Span: Good  Recall:  Good  Fund of Knowledge: Good  Language: Good  Akathisia:  No  Handed:  Right  AIMS (if indicated): not done  Assets:  Communication Skills Desire for Improvement  ADL's:  Intact  Cognition: WNL  Sleep:  Good   Screenings: AUDIT    Flowsheet Row Clinical Support from 04/04/2020 in [place]  Alcohol Use Disorder Identification Test Final Score (AUDIT) 4      PHQ2-9    Flowsheet Row Video Visit from 01/23/2021 in [place] from 04/04/2020 in [place] Visit from 03/07/2018 in [place] Visit from 10/12/2016 in [place] Visit from 06/17/2015 in [place]  PHQ-2 Total Score 0 2 6 0 0  PHQ-9 Total Score -- 7 12 -- --      Flowsheet Row Video Visit from 01/23/2021 in [place] [place] Admission (Discharged) from 10/02/2020 in [place] No Risk No Risk        Assessment and Plan:  Richard Bean is a 70 y.o. year old male with a history of anxiety,CKD stage III B secondary to chronic over use of aspirin and NSAID, secondary hyperparathyroidism,  hypertension, cirrhosis of liver secondary to fatty liver, history of prostate cancer,  who is transferred from [facility].   1. Panic disorder He denies any significant panic attacks over the past few years, although he occasionally feels anxious when he drives.  He has good social support, including his friends and siblings, and enjoys daily life after retirement.  Will continue current dose of  Lexapro to target anxiety.noted that he reportedly had a relapse  in his mood symptoms when he tried to taper off few years ago.  Discussed potential risk of QTC prolongation.  He agrees to continue the current dose at this time.   Plan Continue lexapro 20 mg daily (QTc 462 msec in 01/2017) Please see your primary care provider to check your thyroid (TSH) Next appointment: 10/25 at 9 AM for 30 mins, video   The patient demonstrates the following risk factors for suicide: Chronic risk factors for suicide include: psychiatric disorder of anxiety . Acute risk factors for suicide include: N/A. Protective factors for this patient include: positive social support, coping skills, and hope for the future. Considering these factors, the overall suicide risk at this point appears to be low. Patient is appropriate for outpatient follow up.        Norman Clay, MD 01/23/2021, 3:55 PM

## 2021-01-22 ENCOUNTER — Telehealth (HOSPITAL_COMMUNITY): Payer: Medicare Other | Admitting: Psychiatry

## 2021-01-23 ENCOUNTER — Other Ambulatory Visit: Payer: Self-pay

## 2021-01-23 ENCOUNTER — Encounter: Payer: Self-pay | Admitting: Psychiatry

## 2021-01-23 ENCOUNTER — Telehealth (INDEPENDENT_AMBULATORY_CARE_PROVIDER_SITE_OTHER): Payer: Medicare Other | Admitting: Psychiatry

## 2021-01-23 ENCOUNTER — Telehealth: Payer: Self-pay | Admitting: Psychiatry

## 2021-01-23 DIAGNOSIS — F410 Panic disorder [episodic paroxysmal anxiety] without agoraphobia: Secondary | ICD-10-CM

## 2021-01-23 MED ORDER — ESCITALOPRAM OXALATE 20 MG PO TABS: 20.0000 mg | ORAL_TABLET | Freq: Every day | ORAL | 0 refills | Status: DC

## 2021-01-23 NOTE — Telephone Encounter (Signed)
Called the patient twice for appointment scheduled today. The patient did not answer the phone. Left voice message to contact the office [phone]).

## 2021-02-26 DIAGNOSIS — E871 Hypo-osmolality and hyponatremia: Secondary | ICD-10-CM | POA: Diagnosis not present

## 2021-02-26 DIAGNOSIS — N2581 Secondary hyperparathyroidism of renal origin: Secondary | ICD-10-CM | POA: Diagnosis not present

## 2021-02-26 DIAGNOSIS — I129 Hypertensive chronic kidney disease with stage 1 through stage 4 chronic kidney disease, or unspecified chronic kidney disease: Secondary | ICD-10-CM | POA: Diagnosis not present

## 2021-02-26 DIAGNOSIS — N1832 Chronic kidney disease, stage 3b: Secondary | ICD-10-CM | POA: Diagnosis not present

## 2021-03-05 DIAGNOSIS — N1832 Chronic kidney disease, stage 3b: Secondary | ICD-10-CM | POA: Diagnosis not present

## 2021-03-05 DIAGNOSIS — I129 Hypertensive chronic kidney disease with stage 1 through stage 4 chronic kidney disease, or unspecified chronic kidney disease: Secondary | ICD-10-CM | POA: Diagnosis not present

## 2021-03-05 DIAGNOSIS — E875 Hyperkalemia: Secondary | ICD-10-CM | POA: Diagnosis not present

## 2021-03-05 DIAGNOSIS — N2581 Secondary hyperparathyroidism of renal origin: Secondary | ICD-10-CM | POA: Diagnosis not present

## 2021-03-05 DIAGNOSIS — E871 Hypo-osmolality and hyponatremia: Secondary | ICD-10-CM | POA: Diagnosis not present

## 2021-05-05 ENCOUNTER — Ambulatory Visit: Payer: Self-pay

## 2021-05-05 NOTE — Telephone Encounter (Signed)
Patient called, no answer, no ring, unable to connect the call.  Summary: advice   Pt stated he has a really bad sore throat, stated no to having COVID within the last 10 days, wanted to be seen but no open appts, needed some advice from a nurse on what to do about his painful sore throat.

## 2021-05-06 NOTE — Telephone Encounter (Signed)
Called and spoke with patient who is complaining of sore throat and difficulty swallowing requesting appt to be seen today, advised patient there is no available appts he states that he was in front of urgent care as we were speaking and states that he will go there for evaluation. KW

## 2021-05-15 NOTE — Progress Notes (Signed)
Virtual Visit via Video Note  I connected with Richard Bean on 05/20/21 at  9:00 AM EDT by a video enabled telemedicine application and verified that I am speaking with the correct person using two identifiers.  Location: Patient: home Provider: office Persons participated in the visit- patient, provider    I discussed the limitations of evaluation and management by telemedicine and the availability of in person appointments. The patient expressed understanding and agreed to proceed.   I discussed the assessment and treatment plan with the patient. The patient was provided an opportunity to ask questions and all were answered. The patient agreed with the plan and demonstrated an understanding of the instructions.   The patient was advised to call back or seek an in-person evaluation if the symptoms worsen or if the condition fails to improve as anticipated.  I provided 17 minutes of non-face-to-face time during this encounter.   Richard Clay, MD    [facility] MD/PA/NP OP Progress Note  05/20/2021 9:36 AM Richard Bean  MRN:  [id number]  Chief Complaint:  Chief Complaint   Follow-up; Anxiety    HPI:  This is a follow-up appointment for panic disorder.  He states that he has been doing well.  He enjoys going to places and taking care of his lungs.  He walks 3 miles on the treadmill every day.  He enjoys exercise.  There was an episode of him having intense anxiety.  It occurred when he was driving on the way to a boat.  He had to come back due to this intense anxiety.  Although he is usually good if he is able to focus on driving, he tends to have more anxiety when he is not focused.  He tends to feel more anxious when he goes to new places.  He does not have any concern of being in crowd.  He is willing to work on therapy.  He has good sleep.  He denies difficulty in concentration.  He has good appetite.  He denies feeling depressed or anhedonia.  He denies SI.  He drinks a beer twice a  week.  He denies drug use.  He has been taking Lexapro regularly, and denies any concern about this medication.  He is willing to try therapy although he does not think it would work.   Daily routine: go to lake, shoot pool, plays with his dog Exercise: walk 3 miles every day Employment: retired in 2006, Financial planner Support: brothers Household: by himself Marital status: separated, married twice Number of children: 1 daughter in [place], 87 yo He grew up in a farm [place]. He had a "fine" childhood. he loved his parents. He has 2 living brother. He eats lunch with them weekly, occasionally with his cousin as well  Visit Diagnosis:    ICD-10-CM   1. Panic disorder  F41.0     2. Panic attack  F41.0 EKG 12-Lead    TSH      Past Psychiatric History: Please see initial evaluation for full details. I have reviewed the history. No updates at this time.     Past Medical History:  Past Medical History:  Diagnosis Date   Actinic keratosis    Anxiety    Cancer ([place])    Depression    GERD (gastroesophageal reflux disease)    H/O prostate cancer    Heart murmur    Panic disorder with agoraphobia     Past Surgical History:  Procedure Laterality Date   ANKLE SURGERY  Right    ESOPHAGOGASTRODUODENOSCOPY (EGD) WITH PROPOFOL N/A 06/26/2020   Procedure: ESOPHAGOGASTRODUODENOSCOPY (EGD) WITH PROPOFOL;  Surgeon: Lin Landsman, MD;  Location: [facility];  Service: Gastroenterology;  Laterality: N/A;   ESOPHAGOGASTRODUODENOSCOPY (EGD) WITH PROPOFOL N/A 10/02/2020   Procedure: ESOPHAGOGASTRODUODENOSCOPY (EGD) WITH PROPOFOL;  Surgeon: Lin Landsman, MD;  Location: [facility] ENDOSCOPY;  Service: Gastroenterology;  Laterality: N/A;   PROSTATE SURGERY      Family Psychiatric History: Please see initial evaluation for full details. I have reviewed the history. No updates at this time.     Family History:  Family History  Problem Relation Age of Onset   COPD Mother    Prostate cancer  Father    Cancer - Lung Father    Depression Sister    Stomach cancer Sister    Alcohol abuse Brother    Heart block Brother    Breast cancer Sister    Prostate cancer Brother    Skin cancer Brother     Social History:  Social History   Socioeconomic History   Marital status: Married    Spouse name: Not on file   Number of children: Not on file   Years of education: Not on file   Highest education level: Not on file  Occupational History   Not on file  Tobacco Use   Smoking status: Every Day    Packs/day: 1.00    Years: 47.00    Pack years: 47.00    Types: Cigarettes    Start date: 05/06/1971   Smokeless tobacco: Never  Vaping Use   Vaping Use: Never used  Substance and Sexual Activity   Alcohol use: Yes    Alcohol/week: 16.0 standard drinks    Types: 6 Cans of beer, 10 Shots of liquor per week    Comment: in 1 to 2 times a week    Drug use: No   Sexual activity: Yes  Other Topics Concern   Not on file  Social History Narrative   Not on file   Social Determinants of Health   Financial Resource Strain: Not on file  Food Insecurity: Not on file  Transportation Needs: Not on file  Physical Activity: Not on file  Stress: Not on file  Social Connections: Not on file    Allergies:  Allergies  Allergen Reactions   Chlorpheniramine-Phenylephrine Other (See Comments)    Actifed Makes cold symptoms worse    Metabolic Disorder Labs: Lab Results  Component Value Date   HGBA1C 5.4 05/03/2020   No results found for: PROLACTIN Lab Results  Component Value Date   CHOL 223 (H) 05/03/2020   TRIG 88 05/03/2020   HDL 61 05/03/2020   CHOLHDL 3.8 10/16/2019   LDLCALC 147 (H) 05/03/2020   LDLCALC 127 (H) 10/16/2019   Lab Results  Component Value Date   TSH 1.270 03/09/2019    Therapeutic Level Labs: No results found for: LITHIUM No results found for: VALPROATE No components found for:  CBMZ  Current Medications: Current Outpatient Medications   Medication Sig Dispense Refill   amLODipine (NORVASC) 5 MG tablet Take 5 mg by mouth daily.     enalapril (VASOTEC) 5 MG tablet Take 5 mg by mouth daily.     escitalopram (LEXAPRO) 20 MG tablet Take 1 tablet (20 mg total) by mouth daily. 90 tablet 0   eszopiclone (LUNESTA) 1 MG TABS tablet TAKE 1 TABLET AT BEDTIME AS NEEDED FOR SLEEP. TAKE IMMEDIATELY BEFORE BEDTIME 30 tablet 5   imipramine (TOFRANIL) 25  MG tablet Take 1 tablet (25 mg total) by mouth at bedtime. 90 tablet 2   omeprazole (PRILOSEC) 40 MG capsule Take 1 capsule (40 mg total) by mouth 2 (two) times daily before a meal. 180 capsule 2   omeprazole (PRILOSEC) 40 MG capsule Take 1 capsule (40 mg total) by mouth 2 (two) times daily before a meal. 60 capsule 0   propranolol (INDERAL) 10 MG tablet Take 10 mg by mouth daily as needed.     sildenafil (REVATIO) 20 MG tablet Take 20 mg by mouth as directed.     No current facility-administered medications for this visit.     Musculoskeletal: Strength & Muscle Tone:  N/A Gait & Station:  N/A Patient leans: N/A  Psychiatric Specialty Exam: Review of Systems  Psychiatric/Behavioral:  Negative for agitation, behavioral problems, confusion, decreased concentration, dysphoric mood, hallucinations, self-injury, sleep disturbance and suicidal ideas. The patient is nervous/anxious. The patient is not hyperactive.   All other systems reviewed and are negative.  There were no vitals taken for this visit.There is no height or weight on file to calculate BMI.  General Appearance: Fairly Groomed  Eye Contact:  Good  Speech:  Clear and Coherent  Volume:  Normal  Mood:   good  Affect:  Appropriate, Congruent, and Full Range  Thought Process:  Coherent  Orientation:  Full (Time, Place, and Person)  Thought Content: Logical   Suicidal Thoughts:  No  Homicidal Thoughts:  No  Memory:  Immediate;   Good  Judgement:  Good  Insight:  Good  Psychomotor Activity:  Normal  Concentration:   Concentration: Good and Attention Span: Good  Recall:  Good  Fund of Knowledge: Good  Language: Good  Akathisia:  No  Handed:  Right  AIMS (if indicated): not done  Assets:  Communication Skills Desire for Improvement  ADL's:  Intact  Cognition: WNL  Sleep:  Fair   Screenings: AUDIT    Flowsheet Row Clinical Support from 04/04/2020 in [facility]  Alcohol Use Disorder Identification Test Final Score (AUDIT) 4      PHQ2-9    Flowsheet Row Video Visit from 05/20/2021 in [facility] Video Visit from 01/23/2021 in [facility] from 04/04/2020 in [facility] Visit from 03/07/2018 in [facility] Visit from 10/12/2016 in [facility]  PHQ-2 Total Score 0 0 2 6 0  PHQ-9 Total Score -- -- 7 12 --      Flowsheet Row Video Visit from 05/20/2021 in [facility] Video Visit from 01/23/2021 in [facility] Admission (Discharged) from 10/02/2020 in [facility] No Risk No Risk No Risk        Assessment and Plan:  ADAIR LAUDERBACK is a 70 y.o. year old male with a history of  anxiety,CKD stage III B secondary to chronic over use of aspirin and NSAID, secondary hyperparathyroidism,  hypertension, cirrhosis of liver secondary to fatty liver, history of prostate cancer, who presents for follow up appointment for below.    1. Panic disorder He continues to have occasional intense anxiety, which occurs in the context of driving since the last visit, although there has been significant improvement since he has been on Lexapro. He has good social support, including his friends and siblings, and enjoys daily life after retirement.  Will continue current dose of Lexapro to target anxiety.  Noted that he did have relapse  in his mood symptoms when he tried to  taper it off a few years ago.  We will obtain ECG to rule out QTc prolongation.  Will obtain TSH to rule out medical condition contributing to his mood symptoms.  He will greatly benefit from CBT/ACT; will make referral.   Plan Continue lexapro 20 mg daily (QTc 462 msec in 01/2017) Obtain ECG, lab (TSH) Next appointment: 2/23 at 3 PM for 30 mins, video Referral to therapy     The patient demonstrates the following risk factors for suicide: Chronic risk factors for suicide include: psychiatric disorder of anxiety . Acute risk factors for suicide include: N/A. Protective factors for this patient include: positive social support, coping skills, and hope for the future. Considering these factors, the overall suicide risk at this point appears to be low. Patient is appropriate for outpatient follow up.     Richard Clay, MD 05/20/2021, 9:36 AM

## 2021-05-20 ENCOUNTER — Other Ambulatory Visit: Payer: Self-pay

## 2021-05-20 ENCOUNTER — Encounter: Payer: Self-pay | Admitting: Psychiatry

## 2021-05-20 ENCOUNTER — Telehealth (INDEPENDENT_AMBULATORY_CARE_PROVIDER_SITE_OTHER): Payer: Medicare Other | Admitting: Psychiatry

## 2021-05-20 DIAGNOSIS — F410 Panic disorder [episodic paroxysmal anxiety] without agoraphobia: Secondary | ICD-10-CM | POA: Diagnosis not present

## 2021-05-20 NOTE — Patient Instructions (Signed)
Continue lexapro 20 mg daily Obtain ECG, lab (TSH) Next appointment: 2/23 at 3 PM, video

## 2021-05-27 ENCOUNTER — Other Ambulatory Visit: Payer: TRICARE For Life (TFL)

## 2021-05-27 ENCOUNTER — Other Ambulatory Visit: Payer: Self-pay

## 2021-05-27 DIAGNOSIS — Z8546 Personal history of malignant neoplasm of prostate: Secondary | ICD-10-CM | POA: Diagnosis not present

## 2021-05-28 LAB — PSA: Prostate Specific Ag, Serum: <0.1 ng/mL (ref 0.0–4.0)

## 2021-05-30 ENCOUNTER — Other Ambulatory Visit: Payer: Self-pay

## 2021-05-30 ENCOUNTER — Ambulatory Visit (INDEPENDENT_AMBULATORY_CARE_PROVIDER_SITE_OTHER): Payer: Medicare Other | Admitting: Urology

## 2021-05-30 ENCOUNTER — Encounter: Payer: Self-pay | Admitting: Urology

## 2021-05-30 VITALS — BP 137/69 | HR 88 | Ht 72.0 in | Wt 223.0 lb

## 2021-05-30 DIAGNOSIS — Z8546 Personal history of malignant neoplasm of prostate: Secondary | ICD-10-CM

## 2021-05-30 DIAGNOSIS — N5231 Erectile dysfunction following radical prostatectomy: Secondary | ICD-10-CM | POA: Diagnosis not present

## 2021-05-30 MED ORDER — SILDENAFIL CITRATE 20 MG PO TABS: 20.0000 mg | ORAL_TABLET | ORAL | 3 refills | Status: AC

## 2021-05-30 NOTE — Progress Notes (Signed)
05/30/2021 12:35 PM   Richard Bean 1951-01-28 [id number]  Referring provider: Mar Daring, PA-C [address] [place],  [place] [address]  Chief Complaint  Patient presents with   Follow-up    Urologic history: 1.  History of prostate cancer -[facility] [place]; undetectable PSA   2.  Erectile dysfunction -Sildenafil prn   HPI: 70 y.o. male presents for annual follow-up.  Doing well since last visit No bothersome LUTS Denies dysuria, gross hematuria Denies flank, abdominal or pelvic pain Remains on imipramine for mild stress incontinence Sildenafil 100 mg prn ED PSA 05/27/2021 remains undetectable at <0.1   PMH: Past Medical History:  Diagnosis Date   Actinic keratosis    Anxiety    Cancer ([place])    Depression    GERD (gastroesophageal reflux disease)    H/O prostate cancer    Heart murmur    Panic disorder with agoraphobia     Surgical History: Past Surgical History:  Procedure Laterality Date   ANKLE SURGERY Right    ESOPHAGOGASTRODUODENOSCOPY (EGD) WITH PROPOFOL N/A 06/26/2020   Procedure: ESOPHAGOGASTRODUODENOSCOPY (EGD) WITH PROPOFOL;  Surgeon: Lin Landsman, MD;  Location: [facility];  Service: Gastroenterology;  Laterality: N/A;   ESOPHAGOGASTRODUODENOSCOPY (EGD) WITH PROPOFOL N/A 10/02/2020   Procedure: ESOPHAGOGASTRODUODENOSCOPY (EGD) WITH PROPOFOL;  Surgeon: Lin Landsman, MD;  Location[facility] ENDOSCOPY;  Service: Gastroenterology;  Laterality: N/A;   PROSTATE SURGERY      Home Medications:  Allergies as of 05/30/2021       Reactions   Chlorpheniramine-phenylephrine Other (See Comments)   Actifed Makes cold symptoms worse        Medication List        Accurate as of May 30, 2021 12:35 PM. If you have any questions, ask your nurse or doctor.          amLODipine 5 MG tablet Commonly known as: NORVASC Take 5 mg by mouth daily.   enalapril 5 MG tablet Commonly known as: VASOTEC Take 5 mg by  mouth daily.   escitalopram 20 MG tablet Commonly known as: LEXAPRO Take 1 tablet (20 mg total) by mouth daily.   eszopiclone 1 MG Tabs tablet Commonly known as: LUNESTA TAKE 1 TABLET AT BEDTIME AS NEEDED FOR SLEEP. TAKE IMMEDIATELY BEFORE BEDTIME   imipramine 25 MG tablet Commonly known as: TOFRANIL Take 1 tablet (25 mg total) by mouth at bedtime.   omeprazole 40 MG capsule Commonly known as: PRILOSEC Take 1 capsule (40 mg total) by mouth 2 (two) times daily before a meal.   omeprazole 40 MG capsule Commonly known as: PRILOSEC Take 1 capsule (40 mg total) by mouth 2 (two) times daily before a meal.   propranolol 10 MG tablet Commonly known as: INDERAL Take 10 mg by mouth daily as needed.   sildenafil 20 MG tablet Commonly known as: REVATIO Take 20 mg by mouth as directed.        Allergies:  Allergies  Allergen Reactions   Chlorpheniramine-Phenylephrine Other (See Comments)    Actifed Makes cold symptoms worse    Family History: Family History  Problem Relation Age of Onset   COPD Mother    Prostate cancer Father    Cancer - Lung Father    Depression Sister    Stomach cancer Sister    Alcohol abuse Brother    Heart block Brother    Breast cancer Sister    Prostate cancer Brother    Skin cancer Brother  Social History:  reports that he has been smoking cigarettes. He started smoking about 50 years ago. He has a 47.00 pack-year smoking history. He has never used smokeless tobacco. He reports current alcohol use of about 16.0 standard drinks per week. He reports that he does not use drugs.   Physical Exam: BP 137/69   Pulse 88   Ht 6' (1.829 m)   Wt 223 lb (101.2 kg)   BMI 30.24 kg/m   Constitutional:  Alert and oriented, No acute distress. HEENT: [place] AT, moist mucus membranes.  Trachea midline, no masses. Cardiovascular: No clubbing, cyanosis, or edema. Respiratory: Normal respiratory effort, no increased work of breathing. Psychiatric: Normal  mood and affect.   Assessment & Plan:    1.  History of prostate cancer PSA remains undetectable He desires to continue annual follow-up  2.  Erectile dysfunction Sildenafil refilled   Abbie Sons, MD  [facility] [address] [place], [place] [address] [phone]

## 2021-06-04 ENCOUNTER — Other Ambulatory Visit: Payer: Self-pay

## 2021-06-04 ENCOUNTER — Ambulatory Visit (INDEPENDENT_AMBULATORY_CARE_PROVIDER_SITE_OTHER): Payer: Medicare Other | Admitting: Dermatology

## 2021-06-04 DIAGNOSIS — L578 Other skin changes due to chronic exposure to nonionizing radiation: Secondary | ICD-10-CM | POA: Diagnosis not present

## 2021-06-04 DIAGNOSIS — L820 Inflamed seborrheic keratosis: Secondary | ICD-10-CM

## 2021-06-04 DIAGNOSIS — L821 Other seborrheic keratosis: Secondary | ICD-10-CM

## 2021-06-04 DIAGNOSIS — L570 Actinic keratosis: Secondary | ICD-10-CM | POA: Diagnosis not present

## 2021-06-04 NOTE — Patient Instructions (Signed)

## 2021-06-04 NOTE — Progress Notes (Signed)
   Follow-Up Visit   Subjective  Richard Bean is a 70 y.o. male who presents for the following: Actinic Keratosis (S/P PDT on the scalp - check for new or persistent skin lesions today.). He has several areas to be evaluated today.  The following portions of the chart were reviewed this encounter and updated as appropriate:   Tobacco  Allergies  Meds  Problems  Med Hx  Surg Hx  Fam Hx     Review of Systems:  No other skin or systemic complaints except as noted in HPI or Assessment and Plan.  Objective  Well appearing patient in no apparent distress; mood and affect are within normal limits.  A focused examination was performed including the face and scalp. Relevant physical exam findings are noted in the Assessment and Plan.  Scalp x 16 (16) Erythematous thin papules/macules with gritty scale.   Forehead x 3 (3) Erythematous keratotic or waxy stuck-on papule or plaque.    Assessment & Plan  AK (actinic keratosis) (16) Scalp x 16  Destruction of lesion - Scalp x 16 Complexity: simple   Destruction method: cryotherapy   Informed consent: discussed and consent obtained   Timeout:  patient name, date of birth, surgical site, and procedure verified Lesion destroyed using liquid nitrogen: Yes   Region frozen until ice ball extended beyond lesion: Yes   Outcome: patient tolerated procedure well with no complications   Post-procedure details: wound care instructions given    Inflamed seborrheic keratosis Forehead x 3  Destruction of lesion - Forehead x 3 Complexity: simple   Destruction method: cryotherapy   Informed consent: discussed and consent obtained   Timeout:  patient name, date of birth, surgical site, and procedure verified Lesion destroyed using liquid nitrogen: Yes   Region frozen until ice ball extended beyond lesion: Yes   Outcome: patient tolerated procedure well with no complications   Post-procedure details: wound care instructions given     Actinic Damage - chronic, secondary to cumulative UV radiation exposure/sun exposure over time - diffuse scaly erythematous macules with underlying dyspigmentation - Recommend daily broad spectrum sunscreen SPF 30+ to sun-exposed areas, reapply every 2 hours as needed.  - Recommend staying in the shade or wearing long sleeves, sun glasses (UVA+UVB protection) and wide brim hats (4-inch brim around the entire circumference of the hat). - Call for new or changing lesions.  Seborrheic Keratoses - Stuck-on, waxy, tan-brown papules and/or plaques  - Benign-appearing - Discussed benign etiology and prognosis. - Observe - Call for any changes  Return in about 6 months (around 12/02/2021) for TBSE.  Luther Redo, CMA, am acting as scribe for Sarina Ser, MD . Documentation: I have reviewed the above documentation for accuracy and completeness, and I agree with the above.  Sarina Ser, MD

## 2021-06-15 ENCOUNTER — Encounter: Payer: Self-pay | Admitting: Dermatology

## 2021-07-13 ENCOUNTER — Other Ambulatory Visit: Payer: Self-pay | Admitting: Urology

## 2021-07-13 ENCOUNTER — Other Ambulatory Visit: Payer: Self-pay | Admitting: Psychiatry

## 2021-07-13 DIAGNOSIS — F410 Panic disorder [episodic paroxysmal anxiety] without agoraphobia: Secondary | ICD-10-CM

## 2021-07-13 DIAGNOSIS — F331 Major depressive disorder, recurrent, moderate: Secondary | ICD-10-CM

## 2021-08-27 ENCOUNTER — Ambulatory Visit (INDEPENDENT_AMBULATORY_CARE_PROVIDER_SITE_OTHER): Payer: Medicare Other | Admitting: Physician Assistant

## 2021-08-27 ENCOUNTER — Other Ambulatory Visit: Payer: Self-pay

## 2021-08-27 ENCOUNTER — Encounter: Payer: Self-pay | Admitting: Physician Assistant

## 2021-08-27 VITALS — BP 154/76 | HR 84 | Ht 72.0 in | Wt 228.4 lb

## 2021-08-27 DIAGNOSIS — I129 Hypertensive chronic kidney disease with stage 1 through stage 4 chronic kidney disease, or unspecified chronic kidney disease: Secondary | ICD-10-CM | POA: Diagnosis not present

## 2021-08-27 DIAGNOSIS — K7469 Other cirrhosis of liver: Secondary | ICD-10-CM | POA: Diagnosis not present

## 2021-08-27 DIAGNOSIS — E782 Mixed hyperlipidemia: Secondary | ICD-10-CM | POA: Diagnosis not present

## 2021-08-27 DIAGNOSIS — Z0000 Encounter for general adult medical examination without abnormal findings: Secondary | ICD-10-CM | POA: Diagnosis not present

## 2021-08-27 DIAGNOSIS — Z08 Encounter for follow-up examination after completed treatment for malignant neoplasm: Secondary | ICD-10-CM

## 2021-08-27 DIAGNOSIS — K5901 Slow transit constipation: Secondary | ICD-10-CM | POA: Diagnosis not present

## 2021-08-27 DIAGNOSIS — Z23 Encounter for immunization: Secondary | ICD-10-CM | POA: Diagnosis not present

## 2021-08-27 DIAGNOSIS — Z87891 Personal history of nicotine dependence: Secondary | ICD-10-CM | POA: Diagnosis not present

## 2021-08-27 DIAGNOSIS — R739 Hyperglycemia, unspecified: Secondary | ICD-10-CM | POA: Diagnosis not present

## 2021-08-27 DIAGNOSIS — F4001 Agoraphobia with panic disorder: Secondary | ICD-10-CM | POA: Diagnosis not present

## 2021-08-27 DIAGNOSIS — I10 Essential (primary) hypertension: Secondary | ICD-10-CM

## 2021-08-27 DIAGNOSIS — Z85038 Personal history of other malignant neoplasm of large intestine: Secondary | ICD-10-CM

## 2021-08-27 DIAGNOSIS — N183 Chronic kidney disease, stage 3 unspecified: Secondary | ICD-10-CM

## 2021-08-27 MED ORDER — ZOSTER VAC RECOMB ADJUVANTED 50 MCG/0.5ML IM SUSR: 0.5000 mL | Freq: Once | INTRAMUSCULAR | 0 refills | Status: AC

## 2021-08-27 NOTE — Assessment & Plan Note (Signed)
Will recheck lipids and place on statin, likely needs high intensity but will recalc ascvd w/ new lipid levels The 10-year ASCVD risk score (Arnett DK, et al., 2019) is: 33.5%

## 2021-08-27 NOTE — Assessment & Plan Note (Signed)
Will recheck CMP pt aware to increase fluids and avoid NSAIDS

## 2021-08-27 NOTE — Progress Notes (Signed)
Annual Wellness Visit     Patient: Richard Bean, Male    DOB: [birth year], 71 y.o.   MRN: [id number] Visit Date: 08/27/2021  Today's Provider: Mikey Kirschner, PA-C   Cc. Awv/ cpe  Subjective    Richard Bean is a 71 y.o. male who presents today for his Annual Wellness Visit. He reports consuming a general and can not eat beef or pork  diet.  He reports going running 3 miles a day five days a week.  He generally feels well. He reports sleeping well. He does have additional problems to discuss today.   HPI  Constipation -Over the last year more difficult to have a BM and not going as often. If it has been a few days he usually takes Milk of Magnesia and then his BM are soft. Denies blood in stool.   Family history CAD -Reports brothers have issues with clogged arteries and one unexpectedly had an emergency bypass. Wants to stay on top of his heart health.   Still smoking 1 pack a day, admits to anywhere from 2-7 drinks a week.   Does not use lunesta frequently, still has pills from previous rx.   Medications: Outpatient Medications Prior to Visit  Medication Sig   amLODipine (NORVASC) 5 MG tablet Take 5 mg by mouth daily.   enalapril (VASOTEC) 5 MG tablet Take 5 mg by mouth daily.   escitalopram (LEXAPRO) 20 MG tablet Take 1 tablet (20 mg total) by mouth daily.   eszopiclone (LUNESTA) 1 MG TABS tablet TAKE 1 TABLET AT BEDTIME AS NEEDED FOR SLEEP. TAKE IMMEDIATELY BEFORE BEDTIME   imipramine (TOFRANIL) 25 MG tablet TAKE 1 TABLET AT BEDTIME   omeprazole (PRILOSEC) 40 MG capsule Take 1 capsule (40 mg total) by mouth 2 (two) times daily before a meal.   propranolol (INDERAL) 10 MG tablet Take 10 mg by mouth daily as needed.   sildenafil (REVATIO) 20 MG tablet Take 1 tablet (20 mg total) by mouth as directed.   omeprazole (PRILOSEC) 40 MG capsule Take 1 capsule (40 mg total) by mouth 2 (two) times daily before a meal.   No facility-administered medications prior to visit.     Allergies  Allergen Reactions   Chlorpheniramine-Phenylephrine Other (See Comments)    Actifed Makes cold symptoms worse    Patient Care Team: Mar Daring, PA-C as PCP - General (Family Medicine) Rockey Situ Kathlene November, MD as Consulting Physician (Cardiology)  Review of Systems  Constitutional:  Negative for fatigue and fever.  Respiratory:  Negative for cough and shortness of breath.   Cardiovascular:  Negative for chest pain, palpitations and leg swelling.  Genitourinary:  Positive for frequency.  Neurological:  Negative for dizziness and headaches.       Objective    Blood pressure (!) 154/76, pulse 84, height 6' (1.829 m), weight 228 lb 6.4 oz (103.6 kg), SpO2 98 %.   Physical Exam Constitutional:      General: He is awake.     Appearance: He is well-developed.  HENT:     Head: Normocephalic.     Right Ear: Tympanic membrane, ear canal and external ear normal.     Left Ear: Tympanic membrane, ear canal and external ear normal.     Nose: Nose normal. No congestion or rhinorrhea.     Mouth/Throat:     Mouth: Mucous membranes are moist.     Pharynx: No oropharyngeal exudate or posterior oropharyngeal erythema.  Eyes:  Conjunctiva/sclera: Conjunctivae normal.     Pupils: Pupils are equal, round, and reactive to light.  Cardiovascular:     Rate and Rhythm: Normal rate and regular rhythm.     Heart sounds: Normal heart sounds.  Pulmonary:     Effort: Pulmonary effort is normal.     Breath sounds: Normal breath sounds.  Abdominal:     General: There is no distension.     Palpations: Abdomen is soft.     Tenderness: There is no abdominal tenderness. There is no guarding.  Musculoskeletal:     Cervical back: Normal range of motion.     Right lower leg: No edema.     Left lower leg: No edema.  Lymphadenopathy:     Cervical: No cervical adenopathy.  Skin:    General: Skin is warm.  Neurological:     Mental Status: He is alert and oriented to person,  place, and time.  Psychiatric:        Attention and Perception: Attention normal.        Mood and Affect: Mood normal.        Speech: Speech normal.        Behavior: Behavior normal. Behavior is cooperative.    Most recent functional status assessment: In your present state of health, do you have any difficulty performing the following activities: 08/27/2021  Hearing? N  Vision? N  Comment glasses  Difficulty concentrating or making decisions? N  Walking or climbing stairs? N  Dressing or bathing? N  Doing errands, shopping? N  Some recent data might be hidden   Most recent fall risk assessment: Fall Risk  08/27/2021  Falls in the past year? 0  Comment -  Number falls in past yr: 0  Injury with Fall? 0  Risk for fall due to : No Fall Risks    Most recent depression screenings: PHQ 2/9 Scores 08/27/2021 04/04/2020  PHQ - 2 Score 0 2  PHQ- 9 Score 2 7  Exception Documentation - -  Not completed - -  Some encounter information is confidential and restricted. Go to Review Flowsheets activity to see all data.   Most recent cognitive screening: 6CIT Screen 04/04/2020  What Year? 0 points  What month? 0 points  What time? 0 points  Count back from 20 0 points  Months in reverse 0 points  Repeat phrase 2 points  Total Score 2   Most recent Audit-C alcohol use screening Alcohol Use Disorder Test (AUDIT) 08/27/2021  1. How often do you have a drink containing alcohol? -  2. How many drinks containing alcohol do you have on a typical day when you are drinking? 1  3. How often do you have six or more drinks on one occasion? 2  AUDIT-C Score -  4. How often during the last year have you found that you were not able to stop drinking once you had started? -  5. How often during the last year have you failed to do what was normally expected from you because of drinking? -  6. How often during the last year have you needed a first drink in the morning to get yourself going after a heavy  drinking session? -  8. How often during the last year have you been unable to remember what happened the night before because you had been drinking? -  9. Have you or someone else been injured as a result of your drinking? -  10. Has a relative or friend  or a doctor or another health worker been concerned about your drinking or suggested you cut down? -  Alcohol Use Disorder Identification Test Final Score (AUDIT) -  Alcohol Brief Interventions/Follow-up -   A score of 3 or more in women, and 4 or more in men indicates increased risk for alcohol abuse, EXCEPT if all of the points are from question 1   No results found for any visits on 08/27/21.  Assessment & Plan     Annual wellness visit done today including the all of the following: Reviewed patient's Family Medical History Reviewed and updated list of patient's medical providers Assessment of cognitive impairment was done Assessed patient's functional ability Established a written schedule for health screening [place] Completed and Reviewed  Exercise Activities and Dietary recommendations  Immunization History  Administered Date(s) Administered   Fluad Quad(high Dose 65+) 04/04/2020   Influenza, High Dose Seasonal PF 06/04/2016, 05/05/2018   Influenza,inj,Quad PF,6+ Mos 06/17/2015   PFIZER(Purple Top)SARS-COV-2 Vaccination 09/20/2019, 10/11/2019   Pneumococcal Conjugate-13 10/12/2016   Pneumococcal Polysaccharide-23 03/09/2019   Zoster, Live 05/24/2014    Health Maintenance  Topic Date Due   INFLUENZA VACCINE  02/24/2021   COVID-19 Vaccine (3 - Pfizer risk series) 09/12/2021 (Originally 11/08/2019)   Zoster Vaccines- Shingrix (1 of 2) 11/24/2021 (Originally 07/25/1970)   TETANUS/TDAP  08/27/2022 (Originally 07/25/1970)   COLONOSCOPY (Pts 45-48yrs Insurance coverage will need to be confirmed)  10/23/2022   Pneumonia Vaccine 87+ Years old  Completed   Hepatitis C Screening  Completed   HPV VACCINES   Aged Out     Discussed health benefits of physical activity, and encouraged him to engage in regular exercise appropriate for his age and condition.    Problem List Items Addressed This Visit       Digestive   Other cirrhosis of liver (HCC)    No current symptoms      Relevant Orders   CBC w/Diff/Platelet   Comprehensive Metabolic Panel (CMET)   Slow transit constipation    Advised daily capful of Miralax instead of Milk of Mag. Increase fluids.         Genitourinary   Chronic kidney disease (CKD), stage III (moderate) ([place])    Will recheck CMP pt aware to increase fluids and avoid NSAIDS      Relevant Orders   Comprehensive Metabolic Panel (CMET)   Hypertension, renal disease, stage 1-4 or unspecified chronic kidney disease    Elevated today, pt states he drank last night, didn't sleep well, and had coffee this AM.  Will monitor         Other   Panic disorder with agoraphobia and mild panic attacks    Controlled on lexapro      Mixed hyperlipidemia    Will recheck lipids and place on statin, likely needs high intensity but will recalc ascvd w/ new lipid levels The 10-year ASCVD risk score (Arnett DK, et al., 2019) is: 33.5%       Relevant Orders   Lipid Profile   Other Visit Diagnoses     Encounter for annual wellness exam in Medicare patient    -  Primary   Relevant Medications   Zoster Vaccine Adjuvanted [facility]) injection   Other Relevant Orders   CBC w/Diff/Platelet   Comprehensive Metabolic Panel (CMET)   Lipid Profile   HgB A1c   TSH + free T4   Cologuard   Encounter for physical examination       Relevant Medications  Zoster Vaccine Adjuvanted [facility]) injection   Other Relevant Orders   CBC w/Diff/Platelet   Comprehensive Metabolic Panel (CMET)   Lipid Profile   HgB A1c   TSH + free T4   Cologuard   Personal history of tobacco use, presenting hazards to health       Relevant Orders   Ambulatory Referral Lung Cancer Screening  [facility]   Hypertension, unspecified type       Relevant Orders   CBC w/Diff/Platelet   Comprehensive Metabolic Panel (CMET)   Encounter for follow-up surveillance of colon cancer       Relevant Orders   Cologuard   Need for shingles vaccine       Relevant Medications   Zoster Vaccine Adjuvanted [facility]) injection   Hyperglycemia       Relevant Orders   HgB A1c        Return in about 6 months (around 02/24/2022) for chronic condition f/u. Will f/u with bw results if results change current plan    I, Mikey Kirschner, PA-C have reviewed all documentation for this visit. The documentation on  08/27/2021 for the exam, diagnosis, procedures, and orders are all accurate and complete.    Mikey Kirschner, PA-C  [facility] [phone] (phone) [phone] (fax)  [place]

## 2021-08-27 NOTE — Assessment & Plan Note (Signed)
Controlled on lexapro 

## 2021-08-27 NOTE — Assessment & Plan Note (Signed)
Elevated today, pt states he drank last night, didn't sleep well, and had coffee this AM.  Will monitor

## 2021-08-27 NOTE — Assessment & Plan Note (Signed)
Advised daily capful of Miralax instead of Milk of Mag. Increase fluids.

## 2021-08-27 NOTE — Assessment & Plan Note (Signed)
No current symptoms

## 2021-09-01 ENCOUNTER — Telehealth: Payer: Self-pay

## 2021-09-01 NOTE — Telephone Encounter (Signed)
Claiborne Billings from [employer] called for ICD code

## 2021-09-01 NOTE — Telephone Encounter (Signed)
Copied from [place] [id number]. Topic: General - Other >> Sep 01, 2021  2:19 PM McGill, Nelva Bush wrote: Reason for CRM: Pt stated has a missed call from [place], [place].  Requesting call back.

## 2021-09-03 DIAGNOSIS — E871 Hypo-osmolality and hyponatremia: Secondary | ICD-10-CM | POA: Diagnosis not present

## 2021-09-03 DIAGNOSIS — I129 Hypertensive chronic kidney disease with stage 1 through stage 4 chronic kidney disease, or unspecified chronic kidney disease: Secondary | ICD-10-CM | POA: Diagnosis not present

## 2021-09-03 DIAGNOSIS — N2581 Secondary hyperparathyroidism of renal origin: Secondary | ICD-10-CM | POA: Diagnosis not present

## 2021-09-03 DIAGNOSIS — N1832 Chronic kidney disease, stage 3b: Secondary | ICD-10-CM | POA: Diagnosis not present

## 2021-09-04 ENCOUNTER — Other Ambulatory Visit: Payer: Self-pay

## 2021-09-04 DIAGNOSIS — D126 Benign neoplasm of colon, unspecified: Secondary | ICD-10-CM

## 2021-09-04 DIAGNOSIS — Z1211 Encounter for screening for malignant neoplasm of colon: Secondary | ICD-10-CM

## 2021-09-05 ENCOUNTER — Other Ambulatory Visit: Payer: Self-pay

## 2021-09-05 DIAGNOSIS — Z86010 Personal history of colonic polyps: Secondary | ICD-10-CM

## 2021-09-05 MED ORDER — PEG 3350-KCL-NA BICARB-NACL 420 G PO SOLR: 4000.0000 mL | Freq: Once | ORAL | 0 refills | Status: AC

## 2021-09-05 NOTE — Progress Notes (Signed)
Gastroenterology Pre-Procedure Review  Request Date: 09/24/2021 Requesting Physician: Dr. Marius Ditch   PATIENT REVIEW QUESTIONS: The patient responded to the following health history questions as indicated:    1. Are you having any GI issues? no 2. Do you have a personal history of Polyps? yes (LAST COLONOSCOPY) 3. Do you have a family history of Colon Cancer or Polyps? no 4. Diabetes Mellitus? no 5. Joint replacements in the past 12 months?no 6. Major health problems in the past 3 months?no 7. Any artificial heart valves, MVP, or defibrillator?no    MEDICATIONS & ALLERGIES:    Patient reports the following regarding taking any anticoagulation/antiplatelet therapy:   Plavix, Coumadin, Eliquis, Xarelto, Lovenox, Pradaxa, Brilinta, or Effient? no Aspirin? no  Patient confirms/reports the following medications:  Current Outpatient Medications  Medication Sig Dispense Refill   omeprazole (PRILOSEC) 40 MG capsule Take by mouth.     amLODipine (NORVASC) 5 MG tablet Take 5 mg by mouth daily.     enalapril (VASOTEC) 5 MG tablet Take 5 mg by mouth daily.     escitalopram (LEXAPRO) 20 MG tablet Take 1 tablet (20 mg total) by mouth daily. 90 tablet 0   eszopiclone (LUNESTA) 1 MG TABS tablet TAKE 1 TABLET AT BEDTIME AS NEEDED FOR SLEEP. TAKE IMMEDIATELY BEFORE BEDTIME 30 tablet 5   imipramine (TOFRANIL) 25 MG tablet TAKE 1 TABLET AT BEDTIME 90 tablet 3   omeprazole (PRILOSEC) 40 MG capsule Take 1 capsule (40 mg total) by mouth 2 (two) times daily before a meal. 180 capsule 2   omeprazole (PRILOSEC) 40 MG capsule Take 1 capsule (40 mg total) by mouth 2 (two) times daily before a meal. 60 capsule 0   propranolol (INDERAL) 10 MG tablet Take 10 mg by mouth daily as needed.     sildenafil (REVATIO) 20 MG tablet Take 1 tablet (20 mg total) by mouth as directed. 90 tablet 3   No current facility-administered medications for this visit.    Patient confirms/reports the following allergies:  Allergies   Allergen Reactions   Chlorpheniramine-Phenylephrine Other (See Comments) and Nausea Only    Actifed Makes cold symptoms worse Other reaction(s): Unknown Actifed Makes cold symptoms worse Other reaction(s): Unknown     No orders of the defined types were placed in this encounter.   AUTHORIZATION INFORMATION Primary Insurance: 1D#: Group #:  Secondary Insurance: 1D#: Group #:  SCHEDULE INFORMATION: Date:09/24/2021  Time: Location:[facility] :

## 2021-09-10 DIAGNOSIS — E875 Hyperkalemia: Secondary | ICD-10-CM | POA: Diagnosis not present

## 2021-09-10 DIAGNOSIS — N2581 Secondary hyperparathyroidism of renal origin: Secondary | ICD-10-CM | POA: Diagnosis not present

## 2021-09-10 DIAGNOSIS — I129 Hypertensive chronic kidney disease with stage 1 through stage 4 chronic kidney disease, or unspecified chronic kidney disease: Secondary | ICD-10-CM | POA: Diagnosis not present

## 2021-09-10 DIAGNOSIS — E871 Hypo-osmolality and hyponatremia: Secondary | ICD-10-CM | POA: Diagnosis not present

## 2021-09-10 DIAGNOSIS — N1832 Chronic kidney disease, stage 3b: Secondary | ICD-10-CM | POA: Diagnosis not present

## 2021-09-15 NOTE — Progress Notes (Signed)
Virtual Visit via Video Note  I connected with [name] on 09/18/21 at  3:00 PM EST by a video enabled telemedicine application and verified that I am speaking with the correct person using two identifiers.  Location: Patient: home Provider: office Persons participated in the visit- patient, provider    I discussed the limitations of evaluation and management by telemedicine and the availability of in person appointments. The patient expressed understanding and agreed to proceed.   I discussed the assessment and treatment plan with the patient. The patient was provided an opportunity to ask questions and all were answered. The patient agreed with the plan and demonstrated an understanding of the instructions.   The patient was advised to call back or seek an in-person evaluation if the symptoms worsen or if the condition fails to improve as anticipated.  I provided 15 minutes of non-face-to-face time during this encounter.   Richard Clay, MD    [facility] MD/PA/NP OP Progress Note  09/18/2021 3:34 PM Richard Bean  MRN:  [id number]  Chief Complaint:  Chief Complaint  Patient presents with   Anxiety   Follow-up   HPI:  This is a follow-up appointment for panic disorder.  He states that he has been doing very well.  He enjoyed seeing his brothers during holidays.  He enjoys seeing friends.  He was able to drive to [place] without panic attacks, although he was anxious.  He thinks it has been getting a little better whenever he pushes it.  He lost some weight since working on diet.  He sleeps well most of the time.  He denies feeling depressed or anhedonia.  He denies SI.  He has good concentration.  He denies irritability.  He drinks 4 beers twice a week when he goes outside.  He denies drinking during the day or craving for alcohol.  He denies drug use.  He feels comfortable to stay on the current medication regimen.   Daily routine: go to lake, shoot pool, plays with his  dog Exercise: walk 3 miles every day Employment: retired in 2006, Financial planner Support: brothers Household: by himself Marital status: separated, married twice Number of children: 1 daughter in [place], 72 yo He grew up in a farm [place]. He had a "fine" childhood. he loved his parents. He has 2 living brother. He eats lunch with them weekly, occasionally with his cousin as well   Visit Diagnosis:    ICD-10-CM   1. Panic disorder  F41.0 escitalopram (LEXAPRO) 20 MG tablet      Past Psychiatric History: Please see initial evaluation for full details. I have reviewed the history. No updates at this time.     Past Medical History:  Past Medical History:  Diagnosis Date   Actinic keratosis    Anxiety    Cancer ([place])    Depression    GERD (gastroesophageal reflux disease)    H/O prostate cancer    Heart murmur    Kidney disease    Liver disease    Panic disorder with agoraphobia     Past Surgical History:  Procedure Laterality Date   ANKLE SURGERY Right    ESOPHAGOGASTRODUODENOSCOPY (EGD) WITH PROPOFOL N/A 06/26/2020   Procedure: ESOPHAGOGASTRODUODENOSCOPY (EGD) WITH PROPOFOL;  Surgeon: Lin Landsman, MD;  Location: [facility];  Service: Gastroenterology;  Laterality: N/A;   ESOPHAGOGASTRODUODENOSCOPY (EGD) WITH PROPOFOL N/A 10/02/2020   Procedure: ESOPHAGOGASTRODUODENOSCOPY (EGD) WITH PROPOFOL;  Surgeon: Lin Landsman, MD;  Location: [facility] END[facility]COPY;  Service: Gastroenterology;  Laterality: N/A;   PROSTATE SURGERY      Family Psychiatric History: Please see initial evaluation for full details. I have reviewed the history. No updates at this time.     Family History:  Family History  Problem Relation Age of Onset   COPD Mother    Prostate cancer Father    Cancer - Lung Father    Depression Sister    Stomach cancer Sister    Alcohol abuse Brother    Heart block Brother    Breast cancer Sister    Prostate cancer Brother    Skin cancer Brother      Social History:  Social History   Socioeconomic History   Marital status: Married    Spouse name: Not on file   Number of children: Not on file   Years of education: Not on file   Highest education level: Not on file  Occupational History   Not on file  Tobacco Use   Smoking status: Every Day    Packs/day: 1.00    Years: 47.00    Pack years: 47.00    Types: Cigarettes    Start date: 05/06/1971   Smokeless tobacco: Never  Vaping Use   Vaping Use: Never used  Substance and Sexual Activity   Alcohol use: Yes    Alcohol/week: 16.0 standard drinks    Types: 6 Cans of beer, 10 Shots of liquor per week    Comment: in 1 to 2 times a week    Drug use: No   Sexual activity: Yes  Other Topics Concern   Not on file  Social History Narrative   Not on file   Social Determinants of Health   Financial Resource Strain: Not on file  Food Insecurity: Not on file  Transportation Needs: Not on file  Physical Activity: Not on file  Stress: Not on file  Social Connections: Not on file    Allergies:  Allergies  Allergen Reactions   Chlorpheniramine-Phenylephrine Other (See Comments) and Nausea Only    Actifed Makes cold symptoms worse Other reaction(s): Unknown Actifed Makes cold symptoms worse Other reaction(s): Unknown     Metabolic Disorder Labs: Lab Results  Component Value Date   HGBA1C 5.4 09/16/2021   No results found for: PROLACTIN Lab Results  Component Value Date   CHOL 237 (H) 09/16/2021   TRIG 156 (H) 09/16/2021   HDL 60 09/16/2021   CHOLHDL 4.0 09/16/2021   LDLCALC 149 (H) 09/16/2021   LDLCALC 147 (H) 05/03/2020   Lab Results  Component Value Date   TSH 1.710 09/16/2021   TSH 1.270 03/09/2019    Therapeutic Level Labs: No results found for: LITHIUM No results found for: VALPROATE No components found for:  CBMZ  Current Medications: Current Outpatient Medications  Medication Sig Dispense Refill   amLODipine (NORVASC) 5 MG tablet Take 5 mg  by mouth daily.     enalapril (VASOTEC) 5 MG tablet Take 5 mg by mouth daily.     [START ON 10/12/2021] escitalopram (LEXAPRO) 20 MG tablet Take 1 tablet (20 mg total) by mouth daily. 90 tablet 0   eszopiclone (LUNESTA) 1 MG TABS tablet TAKE 1 TABLET AT BEDTIME AS NEEDED FOR SLEEP. TAKE IMMEDIATELY BEFORE BEDTIME (Patient not taking: Reported on 09/18/2021) 30 tablet 5   imipramine (TOFRANIL) 25 MG tablet TAKE 1 TABLET AT BEDTIME 90 tablet 3   omeprazole (PRILOSEC) 40 MG capsule Take by mouth.     propranolol (INDERAL) 10 MG tablet Take 10 mg  by mouth daily as needed.     sildenafil (REVATIO) 20 MG tablet Take 1 tablet (20 mg total) by mouth as directed. 90 tablet 3   No current facility-administered medications for this visit.     Musculoskeletal: Strength & Muscle Tone:  N/A Gait & Station:  N/A Patient leans: N/A  Psychiatric Specialty Exam: Review of Systems  Psychiatric/Behavioral:  Negative for agitation, behavioral problems, confusion, decreased concentration, dysphoric mood, hallucinations, self-injury, sleep disturbance and suicidal ideas. The patient is nervous/anxious. The patient is not hyperactive.   All other systems reviewed and are negative.  There were no vitals taken for this visit.There is no height or weight on file to calculate BMI.  General Appearance: Fairly Groomed  Eye Contact:  Good  Speech:  Clear and Coherent  Volume:  Normal  Mood:   good  Affect:  Appropriate, Congruent, and Full Range  Thought Process:  Coherent  Orientation:  Full (Time, Place, and Person)  Thought Content: Logical   Suicidal Thoughts:  No  Homicidal Thoughts:  No  Memory:  Immediate;   Good  Judgement:  Good  Insight:  Good  Psychomotor Activity:  Normal  Concentration:  Concentration: Good and Attention Span: Good  Recall:  Good  Fund of Knowledge: Good  Language: Good  Akathisia:  No  Handed:  Right  AIMS (if indicated): not done  Assets:  Communication Skills Desire for  Improvement  ADL's:  Intact  Cognition: WNL  Sleep:  Good   Screenings: AUDIT    Flowsheet Row Clinical Support from 04/04/2020 in [facility]  Alcohol Use Disorder Identification Test Final Score (AUDIT) 4      PHQ2-9    Flowsheet Row Clinical Support from 08/27/2021 in [facility] Video Visit from 05/20/2021 in [facility] [place] Video Visit from 01/23/2021 in [facility] from 04/04/2020 in [place] Visit from 03/07/2018 in [place]  PHQ-2 Total Score 0 0 0 2 6  PHQ-9 Total Score 2 -- -- 7 12      Flowsheet Row Video Visit from 05/20/2021 in [place] Video Visit from 01/23/2021 in [facility] Admission (Discharged) from 10/02/2020 in [facility] No Risk No Risk No Risk        Assessment and Plan:  Richard Bean is a 71 y.o. year old male with a history of anxiety,CKD stage III B secondary to chronic over use of aspirin and NSAID, secondary hyperparathyroidism,  hypertension, cirrhosis of liver secondary to fatty liver, history of prostate cancer, who presents for follow up appointment for below.    1. Panic disorder There has been overall improvement in anxiety, and he denies any significant mood symptoms since the last visit.  He enjoys seeing his friends, siblings.  Will continue current dose of Lexapro to target anxiety. Noted that he did have relapse in his mood symptoms when he tried to taper it off a few years ago.  He was advised again to obtain EKG to rule out QTc prolongation.     Plan Continue lexapro 20 mg daily (QTc 462 msec in 01/2017) Obtain ECG  Next appointment:6/8 at 2 PM for 30 mins, in person - on imipramine for stress induced incontinence [email or web address]     The patient demonstrates the following risk factors  for suicide: Chronic risk factors for suicide include: psychiatric disorder of anxiety . Acute risk factors for suicide  include: N/A. Protective factors for this patient include: positive social support, coping skills, and hope for the future. Considering these factors, the overall suicide risk at this point appears to be low. Patient is appropriate for outpatient follow up.        Collaboration of Care: Collaboration of Care: Other N/A  Consent: Patient/Guardian gives verbal consent for treatment and assignment of benefits for services provided during this visit. Patient/Guardian expressed understanding and agreed to proceed.    Richard Clay, MD 09/18/2021, 3:34 PM

## 2021-09-16 DIAGNOSIS — I10 Essential (primary) hypertension: Secondary | ICD-10-CM | POA: Diagnosis not present

## 2021-09-16 DIAGNOSIS — N183 Chronic kidney disease, stage 3 unspecified: Secondary | ICD-10-CM | POA: Diagnosis not present

## 2021-09-16 DIAGNOSIS — Z0000 Encounter for general adult medical examination without abnormal findings: Secondary | ICD-10-CM | POA: Diagnosis not present

## 2021-09-16 DIAGNOSIS — K7469 Other cirrhosis of liver: Secondary | ICD-10-CM | POA: Diagnosis not present

## 2021-09-16 DIAGNOSIS — E782 Mixed hyperlipidemia: Secondary | ICD-10-CM | POA: Diagnosis not present

## 2021-09-16 DIAGNOSIS — R739 Hyperglycemia, unspecified: Secondary | ICD-10-CM | POA: Diagnosis not present

## 2021-09-17 LAB — CBC WITH DIFFERENTIAL/PLATELET
Basophils Absolute: 0.1 10*3/uL (ref 0.0–0.2)
Basos: 1 %
EOS (ABSOLUTE): 0.2 10*3/uL (ref 0.0–0.4)
Eos: 2 %
Hematocrit: 48.1 % (ref 37.5–51.0)
Hemoglobin: 16.6 g/dL (ref 13.0–17.7)
Immature Grans (Abs): 0.1 10*3/uL (ref 0.0–0.1)
Immature Granulocytes: 1 %
Lymphocytes Absolute: 1.4 10*3/uL (ref 0.7–3.1)
Lymphs: 21 %
MCH: 30.8 pg (ref 26.6–33.0)
MCHC: 34.5 g/dL (ref 31.5–35.7)
MCV: 89 fL (ref 79–97)
Monocytes Absolute: 0.6 10*3/uL (ref 0.1–0.9)
Monocytes: 9 %
Neutrophils Absolute: 4.4 10*3/uL (ref 1.4–7.0)
Neutrophils: 66 %
Platelets: 287 10*3/uL (ref 150–450)
RBC: 5.39 x10E6/uL (ref 4.14–5.80)
RDW: 12.1 % (ref 11.6–15.4)
WBC: 6.7 10*3/uL (ref 3.4–10.8)

## 2021-09-17 LAB — COMPREHENSIVE METABOLIC PANEL
ALT: 18 IU/L (ref 0–44)
AST: 21 IU/L (ref 0–40)
Albumin/Globulin Ratio: 1.8 (ref 1.2–2.2)
Albumin: 4.6 g/dL (ref 3.7–4.7)
Alkaline Phosphatase: 128 IU/L — ABNORMAL HIGH (ref 44–121)
BUN/Creatinine Ratio: 12 (ref 10–24)
BUN: 16 mg/dL (ref 8–27)
Bilirubin Total: 0.4 mg/dL (ref 0.0–1.2)
CO2: 25 mmol/L (ref 20–29)
Calcium: 10 mg/dL (ref 8.6–10.2)
Chloride: 99 mmol/L (ref 96–106)
Creatinine, Ser: 1.31 mg/dL — ABNORMAL HIGH (ref 0.76–1.27)
Globulin, Total: 2.5 g/dL (ref 1.5–4.5)
Glucose: 111 mg/dL — ABNORMAL HIGH (ref 70–99)
Potassium: 5 mmol/L (ref 3.5–5.2)
Sodium: 141 mmol/L (ref 134–144)
Total Protein: 7.1 g/dL (ref 6.0–8.5)
eGFR: 58 mL/min/{1.73_m2} — ABNORMAL LOW (ref >59)

## 2021-09-17 LAB — LIPID PANEL
Chol/HDL Ratio: 4 ratio (ref 0.0–5.0)
Cholesterol, Total: 237 mg/dL — ABNORMAL HIGH (ref 100–199)
HDL: 60 mg/dL (ref >39)
LDL Chol Calc (NIH): 149 mg/dL — ABNORMAL HIGH (ref 0–99)
Triglycerides: 156 mg/dL — ABNORMAL HIGH (ref 0–149)
VLDL Cholesterol Cal: 28 mg/dL (ref 5–40)

## 2021-09-17 LAB — TSH+FREE T4
Free T4: 1.02 ng/dL (ref 0.82–1.77)
TSH: 1.71 u[IU]/mL (ref 0.450–4.500)

## 2021-09-17 LAB — HEMOGLOBIN A1C
Est. average glucose Bld gHb Est-mCnc: 108 mg/dL
Hgb A1c MFr Bld: 5.4 % (ref 4.8–5.6)

## 2021-09-18 ENCOUNTER — Other Ambulatory Visit: Payer: Self-pay

## 2021-09-18 ENCOUNTER — Telehealth (INDEPENDENT_AMBULATORY_CARE_PROVIDER_SITE_OTHER): Payer: Medicare Other | Admitting: Psychiatry

## 2021-09-18 ENCOUNTER — Encounter: Payer: Self-pay | Admitting: Psychiatry

## 2021-09-18 DIAGNOSIS — F410 Panic disorder [episodic paroxysmal anxiety] without agoraphobia: Secondary | ICD-10-CM | POA: Diagnosis not present

## 2021-09-18 MED ORDER — ESCITALOPRAM OXALATE 20 MG PO TABS: 20.0000 mg | ORAL_TABLET | Freq: Every day | ORAL | 0 refills | Status: DC

## 2021-09-18 NOTE — Patient Instructions (Signed)
Continue lexapro 20 mg daily  Obtain ECG  Next appointment:6/8 at 2 PM  The next visit will be in person visit. Please arrive 15 mins before the scheduled time.   [facility]  Address: [address], [place], [place] [address]

## 2021-09-19 ENCOUNTER — Ambulatory Visit: Payer: Self-pay | Admitting: *Deleted

## 2021-09-19 NOTE — Telephone Encounter (Signed)
Patient called and reports he missed a call from office. Reviewed result note from L. Drubel, PA-C from 09/18/21. Reviewed common side effects of atorvastatin, muscle pain and joint pain, severe side effects include inflammation of the liver. Patient reports he has dx fatty liver. Patient reports he will try the atorvastatin and monitor symptoms. F/u appt 11/12/21.

## 2021-09-22 ENCOUNTER — Other Ambulatory Visit: Payer: Self-pay

## 2021-09-22 MED ORDER — ATORVASTATIN CALCIUM 20 MG PO TABS: 20.0000 mg | ORAL_TABLET | Freq: Every day | ORAL | 1 refills | Status: DC

## 2021-09-24 ENCOUNTER — Encounter: Payer: Self-pay | Admitting: Gastroenterology

## 2021-09-24 ENCOUNTER — Ambulatory Visit
Admission: RE | Admit: 2021-09-24 | Discharge: 2021-09-24 | Disposition: A | Discharge status: Home / Self Care | Payer: Medicare Other | Attending: Gastroenterology | Admitting: Gastroenterology

## 2021-09-24 ENCOUNTER — Encounter
Admission: RE | Disposition: A | Discharge status: Home / Self Care | Payer: Self-pay | Source: Home / Self Care | Attending: Gastroenterology

## 2021-09-24 ENCOUNTER — Other Ambulatory Visit: Payer: Self-pay | Admitting: Gastroenterology

## 2021-09-24 DIAGNOSIS — Z86010 Personal history of colonic polyps: Secondary | ICD-10-CM

## 2021-09-24 SURGERY — COLONOSCOPY WITH PROPOFOL
Anesthesia: General

## 2021-09-24 MED ORDER — GOLYTELY 236 G PO SOLR: 4000.0000 mL | Freq: Once | ORAL | 0 refills | Status: AC

## 2021-09-24 MED ORDER — SODIUM CHLORIDE 0.9 % IV SOLN: INTRAVENOUS | Status: DC

## 2021-09-25 ENCOUNTER — Ambulatory Visit
Admission: RE | Admit: 2021-09-25 | Discharge: 2021-09-25 | Disposition: A | Discharge status: Home / Self Care | Payer: Medicare Other | Attending: Gastroenterology | Admitting: Gastroenterology

## 2021-09-25 ENCOUNTER — Encounter: Payer: Self-pay | Admitting: Gastroenterology

## 2021-09-25 ENCOUNTER — Ambulatory Visit: Payer: Medicare Other | Admitting: Anesthesiology

## 2021-09-25 ENCOUNTER — Encounter
Admission: RE | Disposition: A | Discharge status: Home / Self Care | Payer: Self-pay | Source: Home / Self Care | Attending: Gastroenterology

## 2021-09-25 DIAGNOSIS — K644 Residual hemorrhoidal skin tags: Secondary | ICD-10-CM | POA: Diagnosis not present

## 2021-09-25 DIAGNOSIS — Z86010 Personal history of colonic polyps: Secondary | ICD-10-CM | POA: Diagnosis not present

## 2021-09-25 DIAGNOSIS — K635 Polyp of colon: Secondary | ICD-10-CM

## 2021-09-25 DIAGNOSIS — K219 Gastro-esophageal reflux disease without esophagitis: Secondary | ICD-10-CM | POA: Insufficient documentation

## 2021-09-25 DIAGNOSIS — K648 Other hemorrhoids: Secondary | ICD-10-CM | POA: Insufficient documentation

## 2021-09-25 DIAGNOSIS — F419 Anxiety disorder, unspecified: Secondary | ICD-10-CM | POA: Diagnosis not present

## 2021-09-25 DIAGNOSIS — Z860101 Personal history of adenomatous and serrated colon polyps: Secondary | ICD-10-CM

## 2021-09-25 DIAGNOSIS — I10 Essential (primary) hypertension: Secondary | ICD-10-CM | POA: Insufficient documentation

## 2021-09-25 DIAGNOSIS — D126 Benign neoplasm of colon, unspecified: Secondary | ICD-10-CM | POA: Diagnosis not present

## 2021-09-25 DIAGNOSIS — F17210 Nicotine dependence, cigarettes, uncomplicated: Secondary | ICD-10-CM | POA: Insufficient documentation

## 2021-09-25 DIAGNOSIS — Z79899 Other long term (current) drug therapy: Secondary | ICD-10-CM | POA: Diagnosis not present

## 2021-09-25 DIAGNOSIS — Z09 Encounter for follow-up examination after completed treatment for conditions other than malignant neoplasm: Secondary | ICD-10-CM | POA: Diagnosis present

## 2021-09-25 DIAGNOSIS — Z1211 Encounter for screening for malignant neoplasm of colon: Secondary | ICD-10-CM | POA: Diagnosis not present

## 2021-09-25 DIAGNOSIS — K649 Unspecified hemorrhoids: Secondary | ICD-10-CM | POA: Diagnosis not present

## 2021-09-25 HISTORY — PX: COLONOSCOPY WITH PROPOFOL: SHX5780

## 2021-09-25 SURGERY — COLONOSCOPY WITH PROPOFOL
Anesthesia: General

## 2021-09-25 MED ORDER — LIDOCAINE HCL (CARDIAC) PF 100 MG/5ML IV SOSY
PREFILLED_SYRINGE | INTRAVENOUS | Status: DC | PRN
  Administered 2021-09-25: 50 mg via INTRAVENOUS

## 2021-09-25 MED ORDER — LIDOCAINE HCL (PF) 2 % IJ SOLN
INTRAMUSCULAR | Status: AC
  Filled 2021-09-25: qty 5

## 2021-09-25 MED ORDER — PROPOFOL 500 MG/50ML IV EMUL
INTRAVENOUS | Status: AC
  Filled 2021-09-25: qty 50

## 2021-09-25 MED ORDER — SODIUM CHLORIDE 0.9 % IV SOLN: INTRAVENOUS | Status: DC

## 2021-09-25 MED ORDER — PROPOFOL 10 MG/ML IV BOLUS
INTRAVENOUS | Status: DC | PRN
  Administered 2021-09-25: 70 mg via INTRAVENOUS

## 2021-09-25 MED ORDER — PROPOFOL 500 MG/50ML IV EMUL
INTRAVENOUS | Status: DC | PRN
  Administered 2021-09-25: 150 ug/kg/min via INTRAVENOUS

## 2021-09-25 NOTE — Anesthesia Preprocedure Evaluation (Signed)
Anesthesia Evaluation  ?Patient identified by MRN, date of birth, ID band ?Patient awake ? ? ? ?Airway ?Mallampati: II ? ?TM Distance: >3 FB ?Neck ROM: Full ? ? ? Dental ? ?(+) Teeth Intact ?  ?Pulmonary ?Current Smoker and Patient abstained from smoking.,  ?  ? ?+ decreased breath sounds ? ? ? ? ? Cardiovascular ?Exercise Tolerance: Good ?hypertension, Pt. on medications ? ?Rhythm:Regular Rate:Normal ? ? ?  ?Neuro/Psych ?Anxiety negative neurological ROS ?   ? GI/Hepatic ?Neg liver ROS, GERD  ,  ?Endo/Other  ?negative endocrine ROS ? Renal/GU ?  ?negative genitourinary ?  ?Musculoskeletal ? ?(+) Arthritis , Osteoarthritis,   ? Abdominal ?Normal abdominal exam  (+)   ?Peds ?negative pediatric ROS ?(+)  Hematology ?negative hematology ROS ?(+)   ?Anesthesia Other Findings ? ? Reproductive/Obstetrics ? ?  ? ? ? ? ? ? ? ? ? ? ? ? ? ?  ?  ? ? ? ? ? ? ? ? ?Anesthesia Physical ?Anesthesia Plan ? ?ASA: 3 ? ?Anesthesia Plan: General  ? ?Post-op Pain Management:   ? ?Induction: Intravenous ? ?PONV Risk Score and Plan:  ? ?Airway Management Planned: Natural Airway and Nasal Cannula ? ?Additional Equipment:  ? ?Intra-op Plan:  ? ?Post-operative Plan:  ? ?Informed Consent: I have reviewed the patients History and Physical, chart, labs and discussed the procedure including the risks, benefits and alternatives for the proposed anesthesia with the patient or authorized representative who has indicated his/her understanding and acceptance.  ? ? ? ? ? ?Plan Discussed with: CRNA and Surgeon ? ?Anesthesia Plan Comments:   ? ? ? ? ? ? ?Anesthesia Quick Evaluation ? ?

## 2021-09-25 NOTE — H&P (Deleted)
  The note originally documented on this encounter has been moved the the encounter in which it belongs.  

## 2021-09-25 NOTE — Anesthesia Postprocedure Evaluation (Signed)
Anesthesia Post Note ? ?Patient: Richard Bean ? ?Procedure(s) Performed: COLONOSCOPY WITH PROPOFOL ? ?Patient location during evaluation: PACU ?Anesthesia Type: General ?Level of consciousness: awake and awake and alert ?Pain management: satisfactory to patient ?Vital Signs Assessment: post-procedure vital signs reviewed and stable ?Respiratory status: nonlabored ventilation and respiratory function stable ?Cardiovascular status: blood pressure returned to baseline ?Anesthetic complications: no ? ? ?No notable events documented. ? ? ?Last Vitals:  ?Vitals:  ? 09/25/21 1123 09/25/21 1133  ?BP: 123/75 136/77  ?Pulse:    ?Resp:    ?Temp:    ?SpO2:    ?  ?Last Pain:  ?Vitals:  ? 09/25/21 1133  ?TempSrc:   ?PainSc: 0-No pain  ? ? ?  ?  ?  ?  ?  ?  ? ?VAN STAVEREN,Albertine Lafoy ? ? ? ? ?

## 2021-09-25 NOTE — Op Note (Signed)
[facility] ?Gastroenterology ?Patient Name: Richard Bean ?Procedure Date: 09/25/2021 10:40 AM ?MRN: [id number] ?Account #: [id number] ?Date of Birth: 1951-05-13 ?Admit Type: Outpatient ?Age: 71 ?Room: [facility] ENDO ROOM 3 ?Gender: Male ?Note Status: Finalized ?Instrument Name: Colonoscope [id number] ?Procedure:             Colonoscopy ?Indications:           Surveillance: Personal history of adenomatous polyps  ?                       on last colonoscopy 3 years ago, Last colonoscopy: May  ?                       2008 ?Providers:             Lin Landsman MD, MD ?Referring MD:          Forest Gleason Md, MD (Referring MD) ?Medicines:             General Anesthesia ?Complications:         No immediate complications. Estimated blood loss: None. ?Procedure:             Pre-Anesthesia Assessment: ?                       - Prior to the procedure, a History and Physical was  ?                       performed, and patient medications and allergies were  ?                       reviewed. The patient is competent. The risks and  ?                       benefits of the procedure and the sedation options and  ?                       risks were discussed with the patient. All questions  ?                       were answered and informed consent was obtained.  ?                       Patient identification and proposed procedure were  ?                       verified by the physician, the nurse, the  ?                       anesthesiologist, the anesthetist and the technician  ?                       in the pre-procedure area in the procedure room in the  ?                       endoscopy suite. Mental Status Examination: alert and  ?                       oriented. Airway Examination: normal oropharyngeal  ?  airway and neck mobility. Respiratory Examination:  ?                       clear to auscultation. CV Examination: normal.  ?                       Prophylactic Antibiotics: The patient does  not require  ?                       prophylactic antibiotics. Prior Anticoagulants: The  ?                       patient has taken no previous anticoagulant or  ?                       antiplatelet agents. ASA Grade Assessment: III - A  ?                       patient with severe systemic disease. After reviewing  ?                       the risks and benefits, the patient was deemed in  ?                       satisfactory condition to undergo the procedure. The  ?                       anesthesia plan was to use general anesthesia.  ?                       Immediately prior to administration of medications,  ?                       the patient was re-assessed for adequacy to receive  ?                       sedatives. The heart rate, respiratory rate, oxygen  ?                       saturations, blood pressure, adequacy of pulmonary  ?                       ventilation, and response to care were monitored  ?                       throughout the procedure. The physical status of the  ?                       patient was re-assessed after the procedure. ?                       After obtaining informed consent, the colonoscope was  ?                       passed under direct vision. Throughout the procedure,  ?                       the patient's blood pressure, pulse, and oxygen  ?  saturations were monitored continuously. The  ?                       Colonoscope was introduced through the anus and  ?                       advanced to the the cecum, identified by appendiceal  ?                       orifice and ileocecal valve. The colonoscopy was  ?                       performed with moderate difficulty due to  ?                       unsatisfactory bowel prep. Successful completion of  ?                       the procedure was aided by lavage. The patient  ?                       tolerated the procedure well. The quality of the bowel  ?                       preparation was adequate to  identify polyps 6 mm and  ?                       larger in size. ?Findings: ?     The perianal and digital rectal examinations were normal. Pertinent  ?     negatives include normal sphincter tone and no palpable rectal lesions. ?     A diminutive polyp was found in the descending colon. The polyp was  ?     sessile. The polyp was removed with a cold biopsy forceps. Resection and  ?     retrieval were complete. Estimated blood loss: none. ?     The retroflexed view of the distal rectum and anal verge was normal and  ?     showed no anal or rectal abnormalities. ?     Copious quantities of liquid stool was found in the entire colon,  ?     precluding visualization. Lavage of the area was performed using 200 -  ?     500 mL of sterile water, resulting in clearance with good visualization. ?     Non-bleeding external and internal hemorrhoids were found during  ?     retroflexion. The hemorrhoids were large. ?Impression:            - One diminutive polyp in the descending colon,  ?                       removed with a cold biopsy forceps. Resected and  ?                       retrieved. ?                       - The distal rectum and anal verge are normal on  ?  retroflexion view. ?                       - Stool in the entire examined colon. ?                       - Non-bleeding external and internal hemorrhoids. ?Recommendation:        - Repeat colonoscopy in 5 years with 2 day prep  ?                       because the bowel preparation was suboptimal. ?Procedure Code(s):     --- Professional --- ?                       [phone], Colonoscopy, flexible; with biopsy, single or  ?                       multiple ?Diagnosis Code(s):     --- Professional --- ?                       Z86.010, Personal history of colonic polyps ?                       K63.5, Polyp of colon ?                       K64.8, Other hemorrhoids ?CPT copyright 2019 American Medical Association. All rights reserved. ?The codes  documented in this report are preliminary and upon coder review may  ?be revised to meet current compliance requirements. ?Dr. Ulyess Mort ?Delois Silvester Raeanne Gathers MD, MD ?09/25/2021 11:13:04 AM ?This report has been signed electronically. ?Number of Addenda: 0 ?Note Initiated On: 09/25/2021 10:40 AM ?Scope Withdrawal Time: 0 hours 11 minutes 57 seconds  ?Total Procedure Duration: 0 hours 18 minutes 1 second  ?Estimated Blood Loss:  Estimated blood loss: none. ?     [facility] ?

## 2021-09-25 NOTE — Anesthesia Postprocedure Evaluation (Signed)
Anesthesia Post Note ? ?Patient: Richard Bean ? ?Procedure(s) Performed: COLONOSCOPY WITH PROPOFOL ? ?Patient location during evaluation: PACU ?Anesthesia Type: General ?Level of consciousness: awake and awake and alert ?Pain management: pain level controlled ?Vital Signs Assessment: post-procedure vital signs reviewed and stable ?Respiratory status: spontaneous breathing and respiratory function stable ?Cardiovascular status: stable ?Anesthetic complications: no ? ? ?No notable events documented. ? ? ?Last Vitals:  ?Vitals:  ? 09/25/21 1123 09/25/21 1133  ?BP: 123/75 136/77  ?Pulse:    ?Resp:    ?Temp:    ?SpO2:    ?  ?Last Pain:  ?Vitals:  ? 09/25/21 1133  ?TempSrc:   ?PainSc: 0-No pain  ? ? ?  ?  ?  ?  ?  ?  ? ?VAN STAVEREN,Wilfrid Hyser ? ? ? ? ?

## 2021-09-25 NOTE — H&P (Signed)
?Richard Darby, MD ?[address]  ?[address]  ?[place], [place] [address]  ?Main: [phone]  ?Fax: [phone] ?Pager: [phone] ? ?Primary Care Physician:  Richard Kirschner, PA-C ?Primary Gastroenterologist:  Dr. Cephas Bean ? ?Pre-Procedure History & Physical: ?HPI:  Richard Bean is a 71 y.o. male is here for an colonoscopy. ?  ?Past Medical History:  ?Diagnosis Date  ? Actinic keratosis   ? Anxiety   ? Cancer [facility])   ? Depression   ? GERD (gastroesophageal reflux disease)   ? H/O prostate cancer   ? Heart murmur   ? Kidney disease   ? Liver disease   ? Panic disorder with agoraphobia   ? ? ?Past Surgical History:  ?Procedure Laterality Date  ? ANKLE SURGERY Right   ? ESOPHAGOGASTRODUODENOSCOPY (EGD) WITH PROPOFOL N/A 06/26/2020  ? Procedure: ESOPHAGOGASTRODUODENOSCOPY (EGD) WITH PROPOFOL;  Surgeon: Lin Landsman, MD;  Location: [facility] ENDOSCOPY;  Service: Gastroenterology;  Laterality: N/A;  ? ESOPHAGOGASTRODUODENOSCOPY (EGD) WITH PROPOFOL N/A 10/02/2020  ? Procedure: ESOPHAGOGASTRODUODENOSCOPY (EGD) WITH PROPOFOL;  Surgeon: Lin Landsman, MD;  Location: [facility] ENDOSCOPY;  Service: Gastroenterology;  Laterality: N/A;  ? PROSTATE SURGERY    ? ? ?Prior to Admission medications   ?Medication Sig Start Date End Date Taking? Authorizing Provider  ?amLODipine (NORVASC) 5 MG tablet Take 5 mg by mouth daily.    [provider]  ?atorvastatin (LIPITOR) 20 MG tablet Take 1 tablet (20 mg total) by mouth daily. 09/22/21   Richard Kirschner, PA-C  ?enalapril (VASOTEC) 5 MG tablet Take 5 mg by mouth daily.    [provider]  ?escitalopram (LEXAPRO) 20 MG tablet Take 1 tablet (20 mg total) by mouth daily. 10/12/21 01/10/22  Richard Clay, MD  ?eszopiclone (LUNESTA) 1 MG TABS tablet TAKE 1 TABLET AT BEDTIME AS NEEDED FOR SLEEP. TAKE IMMEDIATELY BEFORE BEDTIME ?Patient not taking: Reported on 09/18/2021 09/17/20   Mar Daring, PA-C  ?imipramine (TOFRANIL) 25 MG tablet TAKE 1 TABLET AT  BEDTIME 07/14/21   Stoioff, Ronda Fairly, MD  ?omeprazole (PRILOSEC) 40 MG capsule Take by mouth. 07/03/20   [provider]  ?propranolol (INDERAL) 10 MG tablet Take 10 mg by mouth daily as needed.    [provider]  ?sildenafil (REVATIO) 20 MG tablet Take 1 tablet (20 mg total) by mouth as directed. 05/30/21   Stoioff, Ronda Fairly, MD  ? ? ?Allergies as of 09/24/2021 - Review Complete 09/24/2021  ?Allergen Reaction Noted  ? Chlorpheniramine-phenylephrine Other (See Comments) and Nausea Only 06/07/2013  ? ? ?Family History  ?Problem Relation Age of Onset  ? COPD Mother   ? Prostate cancer Father   ? Cancer - Lung Father   ? Depression Sister   ? Stomach cancer Sister   ? Alcohol abuse Brother   ? Heart block Brother   ? Breast cancer Sister   ? Prostate cancer Brother   ? Skin cancer Brother   ? ? ?Social History  ? ?Socioeconomic History  ? Marital status: Married  ?  Spouse name: Not on file  ? Number of children: Not on file  ? Years of education: Not on file  ? Highest education level: Not on file  ?Occupational History  ? Not on file  ?Tobacco Use  ? Smoking status: Every Day  ?  Packs/day: 1.00  ?  Years: 47.00  ?  Pack years: 47.00  ?  Types: Cigarettes  ?  Start date: 05/06/1971  ? Smokeless tobacco: Never  ?  Vaping Use  ? Vaping Use: Never used  ?Substance and Sexual Activity  ? Alcohol use: Yes  ?  Alcohol/week: 16.0 standard drinks  ?  Types: 6 Cans of beer, 10 Shots of liquor per week  ?  Comment: in 1 to 2 times a week   ? Drug use: No  ? Sexual activity: Yes  ?Other Topics Concern  ? Not on file  ?Social History Narrative  ? Not on file  ? ?Social Determinants of Health  ? ?Financial Resource Strain: Not on file  ?Food Insecurity: Not on file  ?Transportation Needs: Not on file  ?Physical Activity: Not on file  ?Stress: Not on file  ?Social Connections: Not on file  ?Intimate Partner Violence: Not on file  ? ? ?Review of Systems: ?See HPI, otherwise negative ROS ? ?Physical Exam: ?BP 139/83    Pulse 75   Temp (!) 97 ?F (36.1 ?C) (Temporal)   Resp 16   Ht 6' (1.829 m)   Wt 98.4 kg   BMI 29.43 kg/m?  ?General:   Alert,  pleasant and cooperative in NAD ?Head:  Normocephalic and atraumatic. ?Neck:  Supple; no masses or thyromegaly. ?Lungs:  Clear throughout to auscultation.    ?Heart:  Regular rate and rhythm. ?Abdomen:  Soft, nontender and nondistended. Normal bowel sounds, without guarding, and without rebound.   ?Neurologic:  Alert and  oriented x4;  grossly normal neurologically. ? ?Impression/Plan: ?Richard Bean is here for an colonoscopy to be performed for history of adenoma colon ? ?Risks, benefits, limitations, and alternatives regarding  colonoscopy have been reviewed with the patient.  Questions have been answered.  All parties agreeable. ? ? ?Sherri Sear, MD  09/25/2021, 9:58 AM ?

## 2021-09-25 NOTE — Transfer of Care (Signed)
Immediate Anesthesia Transfer of Care Note ? ?Patient: Richard Bean ? ?Procedure(s) Performed: COLONOSCOPY WITH PROPOFOL ? ?Patient Location: PACU ? ?Anesthesia Type:General ? ?Level of Consciousness: awake and alert  ? ?Airway & Oxygen Therapy: Patient Spontanous Breathing ? ?Post-op Assessment: Report given to RN and Post -op Vital signs reviewed and stable ? ?Post vital signs: Reviewed and stable ? ?Last Vitals:  ?Vitals Value Taken Time  ?BP 123/75 09/25/21 1115  ?Temp    ?Pulse 72 09/25/21 1113  ?Resp 14 09/25/21 1115  ?SpO2    ? ? ?Last Pain:  ?Vitals:  ? 09/25/21 1113  ?TempSrc: Temporal  ?PainSc: Asleep  ?   ? ?  ? ?Complications: No notable events documented. ?

## 2021-09-25 NOTE — Anesthesia Procedure Notes (Signed)
Date/Time: 09/25/2021 10:48 AM ?Performed by: Johnna Acosta, CRNA ?Pre-anesthesia Checklist: Patient identified, Emergency Drugs available, Suction available, Patient being monitored and Timeout performed ?Patient Re-evaluated:Patient Re-evaluated prior to induction ?Oxygen Delivery Method: Nasal cannula ?Preoxygenation: Pre-oxygenation with 100% oxygen ?Induction Type: IV induction ? ? ? ? ?

## 2021-09-26 ENCOUNTER — Encounter: Payer: Self-pay | Admitting: Gastroenterology

## 2021-09-26 LAB — SURGICAL PATHOLOGY

## 2021-09-28 ENCOUNTER — Encounter: Payer: Self-pay | Admitting: Gastroenterology

## 2021-10-20 ENCOUNTER — Telehealth: Payer: Self-pay

## 2021-10-20 NOTE — Telephone Encounter (Signed)
Left voicemail to call to scheduled annual LDCT ?

## 2021-10-29 ENCOUNTER — Other Ambulatory Visit: Payer: Self-pay

## 2021-10-29 DIAGNOSIS — Z122 Encounter for screening for malignant neoplasm of respiratory organs: Secondary | ICD-10-CM

## 2021-10-29 DIAGNOSIS — Z87891 Personal history of nicotine dependence: Secondary | ICD-10-CM

## 2021-11-06 ENCOUNTER — Ambulatory Visit
Admission: RE | Admit: 2021-11-06 | Discharge: 2021-11-06 | Disposition: A | Discharge status: Home / Self Care | Payer: Medicare Other | Source: Ambulatory Visit | Attending: Acute Care | Admitting: Acute Care

## 2021-11-06 DIAGNOSIS — Z122 Encounter for screening for malignant neoplasm of respiratory organs: Secondary | ICD-10-CM | POA: Diagnosis not present

## 2021-11-06 DIAGNOSIS — F17210 Nicotine dependence, cigarettes, uncomplicated: Secondary | ICD-10-CM | POA: Diagnosis not present

## 2021-11-06 DIAGNOSIS — Z87891 Personal history of nicotine dependence: Secondary | ICD-10-CM | POA: Insufficient documentation

## 2021-11-10 ENCOUNTER — Other Ambulatory Visit: Payer: Self-pay | Admitting: Acute Care

## 2021-11-10 DIAGNOSIS — Z122 Encounter for screening for malignant neoplasm of respiratory organs: Secondary | ICD-10-CM

## 2021-11-10 DIAGNOSIS — Z87891 Personal history of nicotine dependence: Secondary | ICD-10-CM

## 2021-11-10 DIAGNOSIS — F17210 Nicotine dependence, cigarettes, uncomplicated: Secondary | ICD-10-CM

## 2021-11-12 ENCOUNTER — Ambulatory Visit (INDEPENDENT_AMBULATORY_CARE_PROVIDER_SITE_OTHER): Payer: Medicare Other | Admitting: Physician Assistant

## 2021-11-12 ENCOUNTER — Encounter: Payer: Self-pay | Admitting: Physician Assistant

## 2021-11-12 VITALS — BP 124/72 | HR 89 | Ht 72.0 in | Wt 229.4 lb

## 2021-11-12 DIAGNOSIS — E782 Mixed hyperlipidemia: Secondary | ICD-10-CM

## 2021-11-12 DIAGNOSIS — I129 Hypertensive chronic kidney disease with stage 1 through stage 4 chronic kidney disease, or unspecified chronic kidney disease: Secondary | ICD-10-CM | POA: Diagnosis not present

## 2021-11-12 NOTE — Assessment & Plan Note (Signed)
BP in range today. Continue amlodipine 5 mg, enalapril 5 mg.  ?

## 2021-11-12 NOTE — Progress Notes (Signed)
?  ? ?I,Sha'taria Tyson,acting as a Education administrator for [employer], PA-C.,have documented all relevant documentation on the behalf of Mikey Kirschner, PA-C,as directed by  Mikey Kirschner, PA-C while in the presence of Mikey Kirschner, PA-C. ? ? ?Established patient visit ? ? ?Patient: Richard Bean   DOB: 11-04-1950   71 y.o. Male  MRN: [id number] ?Visit Date: 11/12/2021 ? ?Today's healthcare provider: Mikey Kirschner, PA-C  ? ?Cc. HLD f/u ? ?Subjective  ?  ?HPI  ?Lipid/Cholesterol, Follow-up ? ?Last lipid panel Other pertinent labs  ?Lab Results  ?Component Value Date  ? CHOL 237 (H) 09/16/2021  ? HDL 60 09/16/2021  ? LDLCALC 149 (H) 09/16/2021  ? TRIG 156 (H) 09/16/2021  ? CHOLHDL 4.0 09/16/2021  ? Lab Results  ?Component Value Date  ? ALT 18 09/16/2021  ? AST 21 09/16/2021  ? PLT 287 09/16/2021  ? TSH 1.710 09/16/2021  ?  ? ?He was last seen for this 8 weeks ago.  ?Management since that visit includes start atorvastatin 20 mg . ? ?He reports good compliance with treatment. ?He is not having side effects.  ? ?Symptoms: ?No chest pain No chest pressure/discomfort  ?No dyspnea No lower extremity edema  ?No numbness or tingling of extremity No orthopnea  ?No palpitations No paroxysmal nocturnal dyspnea  ?No speech difficulty No syncope  ? ?Current diet: in general, an "unhealthy" diet ?Current exercise: walking ? ?The 10-year ASCVD risk score (Arnett DK, et al., 2019) is: 24.9% ? ?--------------------------------------------------------------------------------------------------- ? ? ?Medications: ?Outpatient Medications Prior to Visit  ?Medication Sig  ? amLODipine (NORVASC) 5 MG tablet Take 5 mg by mouth daily.  ? atorvastatin (LIPITOR) 20 MG tablet Take 1 tablet (20 mg total) by mouth daily.  ? enalapril (VASOTEC) 5 MG tablet Take 5 mg by mouth daily.  ? escitalopram (LEXAPRO) 20 MG tablet Take 1 tablet (20 mg total) by mouth daily.  ? eszopiclone (LUNESTA) 1 MG TABS tablet TAKE 1 TABLET AT BEDTIME AS NEEDED FOR SLEEP.  TAKE IMMEDIATELY BEFORE BEDTIME  ? imipramine (TOFRANIL) 25 MG tablet TAKE 1 TABLET AT BEDTIME  ? omeprazole (PRILOSEC) 40 MG capsule Take by mouth.  ? propranolol (INDERAL) 10 MG tablet Take 10 mg by mouth daily as needed.  ? sildenafil (REVATIO) 20 MG tablet Take 1 tablet (20 mg total) by mouth as directed.  ? ?No facility-administered medications prior to visit.  ? ? ?Review of Systems  ?Constitutional:  Negative for fatigue and fever.  ?Respiratory:  Negative for cough and shortness of breath.   ?Cardiovascular:  Negative for chest pain, palpitations and leg swelling.  ?Neurological:  Negative for dizziness and headaches.  ? ? ?  Objective  ?  ?Blood pressure 124/72, pulse 89, height 6' (1.829 m), weight 229 lb 6.4 oz (104.1 kg), SpO2 98 %.  ? ?Physical Exam ?Constitutional:   ?   General: He is awake.  ?   Appearance: He is well-developed.  ?HENT:  ?   Head: Normocephalic.  ?Eyes:  ?   Conjunctiva/sclera: Conjunctivae normal.  ?Cardiovascular:  ?   Rate and Rhythm: Normal rate and regular rhythm.  ?   Heart sounds: Normal heart sounds.  ?Pulmonary:  ?   Effort: Pulmonary effort is normal.  ?   Breath sounds: Normal breath sounds.  ?Musculoskeletal:  ?   Right lower leg: No edema.  ?   Left lower leg: No edema.  ?Skin: ?   General: Skin is warm.  ?Neurological:  ?   Mental Status:  He is alert and oriented to person, place, and time.  ?Psychiatric:     ?   Attention and Perception: Attention normal.     ?   Mood and Affect: Mood normal.     ?   Speech: Speech normal.     ?   Behavior: Behavior is cooperative.  ?  ? ?No results found for any visits on 11/12/21. ? Assessment & Plan  ?  ? ?Problem List Items Addressed This Visit   ? ?  ? Genitourinary  ? Hypertension, renal disease, stage 1-4 or unspecified chronic kidney disease  ?  BP in range today. Continue amlodipine 5 mg, enalapril 5 mg.  ? ?  ?  ?  ? Other  ? Mixed hyperlipidemia - Primary  ?  Will check lipids / cmp  ?Prefer fasting ?Continue lipitor 20 mg.   ? ?  ?  ? Relevant Orders  ? Lipid Profile  ? Comprehensive Metabolic Panel (CMET)  ?  ? ?Return in about 4 months (around 03/14/2022) for chronic diseases.  ?   ? ?I, Mikey Kirschner, PA-C have reviewed all documentation for this visit. The documentation on  11/12/2021 for the exam, diagnosis, procedures, and orders are all accurate and complete. ? ?Mikey Kirschner, PA-C ?[place] ?[id number] ?[place], [place], [address] ?Office: [phone] ?Fax: [phone]  ? ?[facility]  ?

## 2021-11-12 NOTE — Assessment & Plan Note (Signed)
Will check lipids / cmp  ?Prefer fasting ?Continue lipitor 20 mg.  ?

## 2021-11-13 DIAGNOSIS — E782 Mixed hyperlipidemia: Secondary | ICD-10-CM | POA: Diagnosis not present

## 2021-11-14 LAB — COMPREHENSIVE METABOLIC PANEL
ALT: 18 IU/L (ref 0–44)
AST: 25 IU/L (ref 0–40)
Albumin/Globulin Ratio: 1.8 (ref 1.2–2.2)
Albumin: 4.8 g/dL — ABNORMAL HIGH (ref 3.7–4.7)
Alkaline Phosphatase: 152 IU/L — ABNORMAL HIGH (ref 44–121)
BUN/Creatinine Ratio: 15 (ref 10–24)
BUN: 21 mg/dL (ref 8–27)
Bilirubin Total: 0.6 mg/dL (ref 0.0–1.2)
CO2: 24 mmol/L (ref 20–29)
Calcium: 9.6 mg/dL (ref 8.6–10.2)
Chloride: 100 mmol/L (ref 96–106)
Creatinine, Ser: 1.44 mg/dL — ABNORMAL HIGH (ref 0.76–1.27)
Globulin, Total: 2.6 g/dL (ref 1.5–4.5)
Glucose: 107 mg/dL — ABNORMAL HIGH (ref 70–99)
Potassium: 5.3 mmol/L — ABNORMAL HIGH (ref 3.5–5.2)
Sodium: 141 mmol/L (ref 134–144)
Total Protein: 7.4 g/dL (ref 6.0–8.5)
eGFR: 52 mL/min/{1.73_m2} — ABNORMAL LOW (ref >59)

## 2021-11-14 LAB — LIPID PANEL
Chol/HDL Ratio: 2.5 ratio (ref 0.0–5.0)
Cholesterol, Total: 176 mg/dL (ref 100–199)
HDL: 70 mg/dL (ref >39)
LDL Chol Calc (NIH): 84 mg/dL (ref 0–99)
Triglycerides: 130 mg/dL (ref 0–149)
VLDL Cholesterol Cal: 22 mg/dL (ref 5–40)

## 2021-12-03 ENCOUNTER — Ambulatory Visit (INDEPENDENT_AMBULATORY_CARE_PROVIDER_SITE_OTHER): Payer: Medicare Other | Admitting: Dermatology

## 2021-12-03 DIAGNOSIS — L821 Other seborrheic keratosis: Secondary | ICD-10-CM | POA: Diagnosis not present

## 2021-12-03 DIAGNOSIS — L570 Actinic keratosis: Secondary | ICD-10-CM | POA: Diagnosis not present

## 2021-12-03 DIAGNOSIS — Z1283 Encounter for screening for malignant neoplasm of skin: Secondary | ICD-10-CM

## 2021-12-03 DIAGNOSIS — L814 Other melanin hyperpigmentation: Secondary | ICD-10-CM | POA: Diagnosis not present

## 2021-12-03 DIAGNOSIS — D1800 Hemangioma unspecified site: Secondary | ICD-10-CM | POA: Diagnosis not present

## 2021-12-03 DIAGNOSIS — D692 Other nonthrombocytopenic purpura: Secondary | ICD-10-CM

## 2021-12-03 DIAGNOSIS — L578 Other skin changes due to chronic exposure to nonionizing radiation: Secondary | ICD-10-CM

## 2021-12-03 DIAGNOSIS — D229 Melanocytic nevi, unspecified: Secondary | ICD-10-CM | POA: Diagnosis not present

## 2021-12-03 NOTE — Patient Instructions (Signed)

## 2021-12-03 NOTE — Progress Notes (Signed)
   Follow-Up Visit   Subjective  Richard Bean is a 71 y.o. male who presents for the following: Actinic Keratosis (6 month follow up of scalp treated with LN2 - The patient presents for Total-Body Skin Exam (TBSE) for skin cancer screening and mole check.  The patient has spots, moles and lesions to be evaluated, some may be new or changing and the patient has concerns that these could be cancer./).  The following portions of the chart were reviewed this encounter and updated as appropriate:   Tobacco  Allergies  Meds  Problems  Med Hx  Surg Hx  Fam Hx     Review of Systems:  No other skin or systemic complaints except as noted in HPI or Assessment and Plan.  Objective  Well appearing patient in no apparent distress; mood and affect are within normal limits.  A full examination was performed including scalp, head, eyes, ears, nose, lips, neck, chest, axillae, abdomen, back, buttocks, bilateral upper extremities, bilateral lower extremities, hands, feet, fingers, toes, fingernails, and toenails. All findings within normal limits unless otherwise noted below.  Scalp (2) Erythematous thin papules/macules with gritty scale.    Assessment & Plan   Purpura - Chronic; persistent and recurrent.  Treatable, but not curable. - Violaceous macules and patches - Benign - Related to trauma, age, sun damage and/or use of blood thinners, chronic use of topical and/or oral steroids - Observe - Can use OTC arnica containing moisturizer such as Dermend Bruise Formula if desired - Call for worsening or other concerns  Lentigines - Scattered tan macules - Due to sun exposure - Benign-appearing, observe - Recommend daily broad spectrum sunscreen SPF 30+ to sun-exposed areas, reapply every 2 hours as needed. - Call for any changes  Seborrheic Keratoses - Stuck-on, waxy, tan-brown papules and/or plaques  - Benign-appearing - Discussed benign etiology and prognosis. - Observe - Call for  any changes  Melanocytic Nevi - Tan-brown and/or pink-flesh-colored symmetric macules and papules - Benign appearing on exam today - Observation - Call clinic for new or changing moles - Recommend daily use of broad spectrum spf 30+ sunscreen to sun-exposed areas.   Hemangiomas - Red papules - Discussed benign nature - Observe - Call for any changes  Actinic Damage - Chronic condition, secondary to cumulative UV/sun exposure - diffuse scaly erythematous macules with underlying dyspigmentation - Recommend daily broad spectrum sunscreen SPF 30+ to sun-exposed areas, reapply every 2 hours as needed.  - Staying in the shade or wearing long sleeves, sun glasses (UVA+UVB protection) and wide brim hats (4-inch brim around the entire circumference of the hat) are also recommended for sun protection.  - Call for new or changing lesions.  Skin cancer screening performed today.  AK (actinic keratosis) (2) Scalp  Destruction of lesion - Scalp Complexity: simple   Destruction method: cryotherapy   Informed consent: discussed and consent obtained   Timeout:  patient name, date of birth, surgical site, and procedure verified Lesion destroyed using liquid nitrogen: Yes   Region frozen until ice ball extended beyond lesion: Yes   Outcome: patient tolerated procedure well with no complications   Post-procedure details: wound care instructions given    Skin cancer screening   Return in about 1 year (around 12/04/2022) for TBSE.  I, Ashok Cordia, CMA, am acting as scribe for Sarina Ser, MD . Documentation: I have reviewed the above documentation for accuracy and completeness, and I agree with the above.  Sarina Ser, MD

## 2021-12-17 ENCOUNTER — Encounter: Payer: Self-pay | Admitting: Dermatology

## 2021-12-29 NOTE — Progress Notes (Signed)
BH MD/PA/NP OP Progress Note  01/01/2022 2:33 PM Richard Bean  MRN:  [id number]  Chief Complaint:  Chief Complaint  Patient presents with   Follow-up   HPI:  This is a follow-up appointment for panic disorder.  He states that he has been doing very well.  His daughter had a birthday, and he communicated with her on text message.  He had an intense anxiety while he was driving to [place].  He was fine afterwards.  He takes propranolol every day for anxiety.  He states that he used to have intense anxiety when he was in a car with others before starting on the medication.  He does not feel anxious when he is not driving.  He denies feeling depressed.  He denies change in appetite.  He denies SI.  He feels comfortable to stay on his current medication.   Daily routine: go to lake, shoot pool, plays with his dog Exercise: walk 3 miles every day Employment: retired in 2006, Financial planner Support: brothers Household: by himself Marital status: separated, married twice Number of children: 1 daughter in [place], 51 yo He grew up in a farm [place]. He had a "fine" childhood. he loved his parents. He has 2 living brother. He eats lunch with them weekly, occasionally with his cousin as well  Wt Readings from Last 3 Encounters:  01/01/22 227 lb 6.4 oz (103.1 kg)  11/12/21 229 lb 6.4 oz (104.1 kg)  11/06/21 223 lb (101.2 kg)     Visit Diagnosis:    ICD-10-CM   1. Panic disorder  F41.0 EKG 12-Lead    escitalopram (LEXAPRO) 20 MG tablet      Past Psychiatric History: Please see initial evaluation for full details. I have reviewed the history. No updates at this time.     Past Medical History:  Past Medical History:  Diagnosis Date   Actinic keratosis    Anxiety    Cancer ([place])    Depression    GERD (gastroesophageal reflux disease)    H/O prostate cancer    Heart murmur    Kidney disease    Liver disease    Panic disorder with agoraphobia     Past Surgical History:  Procedure  Laterality Date   ANKLE SURGERY Right    COLONOSCOPY WITH PROPOFOL N/A 09/25/2021   Procedure: COLONOSCOPY WITH PROPOFOL;  Surgeon: Lin Landsman, MD;  Location: [facility] ENDOSCOPY;  Service: Gastroenterology;  Laterality: N/A;   ESOPHAGOGASTRODUODENOSCOPY (EGD) WITH PROPOFOL N/A 06/26/2020   Procedure: ESOPHAGOGASTRODUODENOSCOPY (EGD) WITH PROPOFOL;  Surgeon: Lin Landsman, MD;  Location: [facility] ENDOSCOPY;  Service: Gastroenterology;  Laterality: N/A;   ESOPHAGOGASTRODUODENOSCOPY (EGD) WITH PROPOFOL N/A 10/02/2020   Procedure: ESOPHAGOGASTRODUODENOSCOPY (EGD) WITH PROPOFOL;  Surgeon: Lin Landsman, MD;  Location: [facility] ENDOSCOPY;  Service: Gastroenterology;  Laterality: N/A;   PROSTATE SURGERY      Family Psychiatric History: Please see initial evaluation for full details. I have reviewed the history. No updates at this time.     Family History:  Family History  Problem Relation Age of Onset   COPD Mother    Prostate cancer Father    Cancer - Lung Father    Depression Sister    Stomach cancer Sister    Alcohol abuse Brother    Heart block Brother    Breast cancer Sister    Prostate cancer Brother    Skin cancer Brother     Social History:  Social History   Socioeconomic History   Marital  status: Married    Spouse name: Not on file   Number of children: Not on file   Years of education: Not on file   Highest education level: Not on file  Occupational History   Not on file  Tobacco Use   Smoking status: Every Day    Packs/day: 1.00    Years: 47.00    Total pack years: 47.00    Types: Cigarettes    Start date: 05/06/1971   Smokeless tobacco: Never  Vaping Use   Vaping Use: Never used  Substance and Sexual Activity   Alcohol use: Yes    Alcohol/week: 16.0 standard drinks of alcohol    Types: 6 Cans of beer, 10 Shots of liquor per week    Comment: in 1 to 2 times a week    Drug use: No   Sexual activity: Yes  Other Topics Concern   Not on file  Social  History Narrative   Not on file   Social Determinants of Health   Financial Resource Strain: Not on file  Food Insecurity: Not on file  Transportation Needs: Not on file  Physical Activity: Inactive (04/04/2018)   Exercise Vital Sign    Days of Exercise per Week: 0 days    Minutes of Exercise per Session: 0 min  Stress: Not on file  Social Connections: Not on file    Allergies:  Allergies  Allergen Reactions   Chlorpheniramine-Phenylephrine Other (See Comments) and Nausea Only    Actifed Makes cold symptoms worse Other reaction(s): Unknown Actifed Makes cold symptoms worse Other reaction(s): Unknown     Metabolic Disorder Labs: Lab Results  Component Value Date   HGBA1C 5.4 09/16/2021   No results found for: "PROLACTIN" Lab Results  Component Value Date   CHOL 176 11/13/2021   TRIG 130 11/13/2021   HDL 70 11/13/2021   CHOLHDL 2.5 11/13/2021   LDLCALC 84 11/13/2021   LDLCALC 149 (H) 09/16/2021   Lab Results  Component Value Date   TSH 1.710 09/16/2021   TSH 1.270 03/09/2019    Therapeutic Level Labs: No results found for: "LITHIUM" No results found for: "VALPROATE" No results found for: "CBMZ"  Current Medications: Current Outpatient Medications  Medication Sig Dispense Refill   amLODipine (NORVASC) 5 MG tablet Take 5 mg by mouth daily.     atorvastatin (LIPITOR) 20 MG tablet Take 1 tablet (20 mg total) by mouth daily. 90 tablet 1   enalapril (VASOTEC) 5 MG tablet Take 5 mg by mouth daily.     eszopiclone (LUNESTA) 1 MG TABS tablet TAKE 1 TABLET AT BEDTIME AS NEEDED FOR SLEEP. TAKE IMMEDIATELY BEFORE BEDTIME 30 tablet 5   imipramine (TOFRANIL) 25 MG tablet TAKE 1 TABLET AT BEDTIME 90 tablet 3   omeprazole (PRILOSEC) 40 MG capsule Take by mouth.     propranolol (INDERAL) 10 MG tablet Take 10 mg by mouth daily as needed.     sildenafil (REVATIO) 20 MG tablet Take 1 tablet (20 mg total) by mouth as directed. 90 tablet 3   [START ON 01/11/2022] escitalopram  (LEXAPRO) 20 MG tablet Take 1 tablet (20 mg total) by mouth daily. 90 tablet 1   No current facility-administered medications for this visit.     Musculoskeletal: Strength & Muscle Tone: within normal limits Gait & Station: normal Patient leans: N/A  Psychiatric Specialty Exam: Review of Systems  Psychiatric/Behavioral:  Negative for agitation, behavioral problems, confusion, decreased concentration, dysphoric mood, hallucinations, self-injury and sleep disturbance. The patient is nervous/anxious.  The patient is not hyperactive.   All other systems reviewed and are negative.   Blood pressure 136/75, pulse 89, temperature 98.5 F (36.9 C), temperature source Temporal, weight 227 lb 6.4 oz (103.1 kg).Body mass index is 30.84 kg/m.  General Appearance: Fairly Groomed  Eye Contact:  Good  Speech:  Clear and Coherent  Volume:  Normal  Mood:   good  Affect:  Appropriate, Congruent, and Full Range  Thought Process:  Coherent  Orientation:  Full (Time, Place, and Person)  Thought Content: Logical   Suicidal Thoughts:  No  Homicidal Thoughts:  No  Memory:  Immediate;   Good  Judgement:  Good  Insight:  Good  Psychomotor Activity:  Normal  Concentration:  Concentration: Good and Attention Span: Good  Recall:  Good  Fund of Knowledge: Good  Language: Good  Akathisia:  No  Handed:  Right  AIMS (if indicated): not done  Assets:  Communication Skills Desire for Improvement  ADL's:  Intact  Cognition: WNL  Sleep:  Fair   Screenings: AUDIT    Flowsheet Row Clinical Support from 04/04/2020 in [place]  Alcohol Use Disorder Identification Test Final Score (AUDIT) 4      PHQ2-9    [place] Office Visit from 01/01/2022 in [place] from 08/27/2021 in [facility] Video Visit from 05/20/2021 in [place] Video Visit from 01/23/2021 in [place] from 04/04/2020 in [place]  PHQ-2 Total Score 1 0 0 0 2  PHQ-9 Total Score -- 2 -- -- 7      Flowsheet Row Admission (Discharged) from 09/25/2021 in [place] Admission (Discharged) from 09/24/2021 in [place] Video Visit from 05/20/2021 in [place] No Risk No Risk No Risk        Assessment and Plan:  Richard Bean is a 71 y.o. year old male with a history of anxiety,CKD stage III B secondary to chronic over use of aspirin and NSAID, secondary hyperparathyroidism,  hypertension, cirrhosis of liver secondary to fatty liver, history of prostate cancer, who presents for follow up appointment for below.   1. Panic disorder He denies any significant mood symptoms except occasional anxiety.  He enjoys seeing his friends, siblings.  Will continue current dose of Lexapro to target anxiety. Noted that he did have relapse in his mood symptoms when he tried to taper it off a few years ago.  He was advised again to obtain EKG to rule out QTc prolongation.   Plan Continue lexapro 20 mg daily (QTc 462 msec in 01/2017) Obtain ECG  Next appointment: 11/7 at 4 PM, in person - on imipramine for stress induced incontinence - on metoprolol 20 mg daily as needed for anxiety [email or web address]     The patient demonstrates the following risk factors for suicide: Chronic risk factors for suicide include: psychiatric disorder of anxiety . Acute risk factors for suicide include: N/A. Protective factors for this patient include: positive social support, coping skills, and hope for the future. Considering these factors, the overall suicide risk at this point appears to be low. Patient is appropriate for outpatient follow up.           Collaboration of Care: Collaboration of Care: Other N/A  Patient/Guardian was advised Release of Information must  be obtained prior to any record release in order to collaborate their care with an  outside provider. Patient/Guardian was advised if they have not already done so to contact the registration department to sign all necessary forms in order for [place] to release information regarding their care.   Consent: Patient/Guardian gives verbal consent for treatment and assignment of benefits for services provided during this visit. Patient/Guardian expressed understanding and agreed to proceed.    Norman Clay, MD 01/01/2022, 2:33 PM

## 2022-01-01 ENCOUNTER — Encounter: Payer: Self-pay | Admitting: Psychiatry

## 2022-01-01 ENCOUNTER — Ambulatory Visit (INDEPENDENT_AMBULATORY_CARE_PROVIDER_SITE_OTHER): Payer: Medicare Other | Admitting: Psychiatry

## 2022-01-01 ENCOUNTER — Ambulatory Visit
Admission: RE | Admit: 2022-01-01 | Discharge: 2022-01-01 | Disposition: A | Discharge status: Home / Self Care | Payer: Medicare Other | Source: Ambulatory Visit | Attending: Psychiatry | Admitting: Psychiatry

## 2022-01-01 VITALS — BP 136/75 | HR 89 | Temp 98.5°F | Wt 227.4 lb

## 2022-01-01 DIAGNOSIS — Z79899 Other long term (current) drug therapy: Secondary | ICD-10-CM | POA: Insufficient documentation

## 2022-01-01 DIAGNOSIS — F410 Panic disorder [episodic paroxysmal anxiety] without agoraphobia: Secondary | ICD-10-CM

## 2022-01-01 DIAGNOSIS — Z5181 Encounter for therapeutic drug level monitoring: Secondary | ICD-10-CM | POA: Diagnosis not present

## 2022-01-01 MED ORDER — ESCITALOPRAM OXALATE 20 MG PO TABS: 20.0000 mg | ORAL_TABLET | Freq: Every day | ORAL | 1 refills | Status: DC

## 2022-01-01 NOTE — Patient Instructions (Signed)
Continue lexapro 20 mg daily  Obtain ECG  Next appointment: 11/7 at 4 PM

## 2022-01-02 NOTE — Progress Notes (Signed)
Please inform him that EKG has no significant abnormality. Please advise him to continue current dose of lexapro.

## 2022-01-09 NOTE — Progress Notes (Signed)
Pt was left a message,  asked to call office back with any questions.

## 2022-01-12 ENCOUNTER — Telehealth: Payer: Self-pay

## 2022-01-12 NOTE — Telephone Encounter (Signed)
Last office visit 07/03/2020 GERD  Last refill 07/03/20

## 2022-01-23 ENCOUNTER — Telehealth: Payer: Self-pay | Admitting: Gastroenterology

## 2022-01-23 MED ORDER — OMEPRAZOLE 40 MG PO CPDR
40.0000 mg | DELAYED_RELEASE_CAPSULE | Freq: Two times a day (BID) | ORAL | 1 refills | Status: DC
Start: 2022-01-23 — End: 2022-07-10

## 2022-01-23 NOTE — Addendum Note (Signed)
Addended by: Ulyess Blossom L on: 01/23/2022 10:53 AM   Modules accepted: Orders

## 2022-01-23 NOTE — Telephone Encounter (Signed)
Patient made follow up appointment. Sent medication to pharmacy

## 2022-01-23 NOTE — Telephone Encounter (Signed)
Pt is requesting refill on generic omeprazole  [facility]

## 2022-01-28 ENCOUNTER — Ambulatory Visit (INDEPENDENT_AMBULATORY_CARE_PROVIDER_SITE_OTHER): Payer: Medicare Other | Admitting: Physician Assistant

## 2022-01-28 ENCOUNTER — Ambulatory Visit
Admission: RE | Admit: 2022-01-28 | Discharge: 2022-01-28 | Disposition: A | Discharge status: Home / Self Care | Payer: Medicare Other | Source: Ambulatory Visit | Attending: Physician Assistant | Admitting: Physician Assistant

## 2022-01-28 ENCOUNTER — Ambulatory Visit
Admission: RE | Admit: 2022-01-28 | Discharge: 2022-01-28 | Disposition: A | Discharge status: Home / Self Care | Payer: Medicare Other | Attending: Physician Assistant | Admitting: Physician Assistant

## 2022-01-28 ENCOUNTER — Encounter: Payer: Self-pay | Admitting: Physician Assistant

## 2022-01-28 VITALS — BP 133/73 | HR 74 | Ht 72.0 in | Wt 228.3 lb

## 2022-01-28 DIAGNOSIS — M5441 Lumbago with sciatica, right side: Secondary | ICD-10-CM | POA: Diagnosis not present

## 2022-01-28 DIAGNOSIS — I10 Essential (primary) hypertension: Secondary | ICD-10-CM | POA: Diagnosis not present

## 2022-01-28 DIAGNOSIS — M5442 Lumbago with sciatica, left side: Secondary | ICD-10-CM | POA: Insufficient documentation

## 2022-01-28 DIAGNOSIS — M545 Low back pain, unspecified: Secondary | ICD-10-CM | POA: Diagnosis not present

## 2022-01-28 DIAGNOSIS — N1831 Chronic kidney disease, stage 3a: Secondary | ICD-10-CM

## 2022-01-28 DIAGNOSIS — M25552 Pain in left hip: Secondary | ICD-10-CM | POA: Diagnosis not present

## 2022-01-28 DIAGNOSIS — R102 Pelvic and perineal pain: Secondary | ICD-10-CM | POA: Diagnosis not present

## 2022-01-28 MED ORDER — KETOROLAC TROMETHAMINE 30 MG/ML IJ SOLN: 30.0000 mg | Freq: Once | INTRAMUSCULAR | Status: DC

## 2022-01-28 MED ORDER — METHYLPREDNISOLONE 4 MG PO TBPK
ORAL_TABLET | ORAL | 0 refills | Status: DC
Start: 2022-01-28 — End: 2022-06-03

## 2022-01-28 NOTE — Progress Notes (Signed)
I,Sha'taria Tyson,acting as a Education administrator for [employer], PA-C.,have documented all relevant documentation on the behalf of Mikey Kirschner, PA-C,as directed by  Mikey Kirschner, PA-C while in the presence of Mikey Kirschner, PA-C.   Established patient visit   Patient: Richard Bean   DOB: [birth year]   71 y.o. Male  MRN: [id number] Visit Date: 01/28/2022  Today's healthcare provider: Mikey Kirschner, PA-C   Cc. Htn f/u, back pain  Subjective    HPI  Reports back pain that started Saturday AM. He woke up and was in significant pain at his lower back that radiates down both hips. Denies any injury on Saturday or the week prior. He has been taking aspirin twice daily and took six 500 mg extra strength aspirin this morning at 9 am. Denies any numbness, changes in urination or bowel habits. Pain is worse when going from sitting to standing, or when bending over.  Hypertension, follow-up  BP Readings from Last 3 Encounters:  01/28/22 133/73  01/01/22 136/75  11/12/21 124/72   Wt Readings from Last 3 Encounters:  01/28/22 228 lb 4.8 oz (103.6 kg)  01/01/22 227 lb 6.4 oz (103.1 kg)  11/12/21 229 lb 6.4 oz (104.1 kg)     He was last seen for hypertension 2 months ago.  BP at that visit was 124/72. Management since that visit includes continue current medication.  He reports excellent compliance with treatment. He is not having side effects.  He is following a Regular, with no additional salt  diet. He is not exercising. He does smoke.  Use of agents associated with hypertension: none.   Outside blood pressures are not being checked Symptoms: No chest pain No chest pressure  No palpitations No syncope  No dyspnea No orthopnea  No paroxysmal nocturnal dyspnea No lower extremity edema   Pertinent labs Lab Results  Component Value Date   CHOL 176 11/13/2021   HDL 70 11/13/2021   LDLCALC 84 11/13/2021   TRIG 130 11/13/2021   CHOLHDL 2.5 11/13/2021   Lab Results   Component Value Date   NA 141 11/13/2021   K 5.3 (H) 11/13/2021   CREATININE 1.44 (H) 11/13/2021   EGFR 52 (L) 11/13/2021   GLUCOSE 107 (H) 11/13/2021   TSH 1.710 09/16/2021     The 10-year ASCVD risk score (Arnett DK, et al., 2019) is: 23%  ---------------------------------------------------------------------------------------------------   Medications: Outpatient Medications Prior to Visit  Medication Sig   amLODipine (NORVASC) 5 MG tablet Take 5 mg by mouth daily.   atorvastatin (LIPITOR) 20 MG tablet Take 1 tablet (20 mg total) by mouth daily.   enalapril (VASOTEC) 5 MG tablet Take 5 mg by mouth daily.   escitalopram (LEXAPRO) 20 MG tablet Take 1 tablet (20 mg total) by mouth daily.   eszopiclone (LUNESTA) 1 MG TABS tablet TAKE 1 TABLET AT BEDTIME AS NEEDED FOR SLEEP. TAKE IMMEDIATELY BEFORE BEDTIME   imipramine (TOFRANIL) 25 MG tablet TAKE 1 TABLET AT BEDTIME   omeprazole (PRILOSEC) 40 MG capsule Take 1 capsule (40 mg total) by mouth 2 (two) times daily before a meal.   propranolol (INDERAL) 10 MG tablet Take 10 mg by mouth daily as needed.   sildenafil (REVATIO) 20 MG tablet Take 1 tablet (20 mg total) by mouth as directed.   No facility-administered medications prior to visit.    Review of Systems  Constitutional:  Negative for fatigue and fever.  Respiratory:  Negative for cough and shortness of breath.   Cardiovascular:  Negative for chest pain, palpitations and leg swelling.  Musculoskeletal:  Positive for back pain and gait problem.  Neurological:  Negative for dizziness and headaches.       Blood pressure 133/73, pulse 74, height 6' (1.829 m), weight 228 lb 4.8 oz (103.6 kg), SpO2 95 %.   Physical Exam Constitutional:      General: He is awake.     Appearance: He is well-developed.  HENT:     Head: Normocephalic.  Eyes:     Conjunctiva/sclera: Conjunctivae normal.  Cardiovascular:     Rate and Rhythm: Normal rate and regular rhythm.     Heart sounds:  Normal heart sounds.  Pulmonary:     Effort: Pulmonary effort is normal.     Breath sounds: Normal breath sounds.  Musculoskeletal:     Lumbar back: Tenderness present. No swelling or deformity.     Comments: Midline tenderness w/ tense paraspinal muscles. + b/l SLR. No visible rash, lesion, erythema.   Skin:    General: Skin is warm.  Neurological:     Mental Status: He is alert and oriented to person, place, and time.  Psychiatric:        Attention and Perception: Attention normal.        Mood and Affect: Mood normal.        Speech: Speech normal.        Behavior: Behavior is cooperative.      No results found for any visits on 01/28/22.  Assessment & Plan     Problem List Items Addressed This Visit       Cardiovascular and Mediastinum   Hypertension - Primary    Well controlled on current medications Reviewed last cmp--pt has f/u with nephrologist in august to repeat bw, will not repeat today F/u 6 mo        Nervous and Auditory   Acute midline low back pain with bilateral sciatica    Dg lumbar and pelvic xrays d/t unusual presentation  Discussed pain medications -- d/t his multiple chronic conditions and current medications really have no options; discussed d/c imipramine temporarily and taking tramadol, pt declined Would give toradol IM in office-- but pt took 6 ASA this morning-- 3,000 mg. Advised pt strongly not to take ASA at those doses. Advised he can return tomorrow for injection if he wishes.  rx medrol dose pack      Relevant Medications   methylPREDNISolone (MEDROL DOSEPAK) 4 MG TBPK tablet   Other Relevant Orders   DG Lumbar Spine Complete   DG Pelvis 1-2 Views     Genitourinary   Stage 3 chronic kidney disease ([place])    Managed by nephrology, next appt 8/23       Return in about 6 months (around 07/31/2022) for CPE.      I, Mikey Kirschner, PA-C have reviewed all documentation for this visit. The documentation on  01/28/2022  for the exam,  diagnosis, procedures, and orders are all accurate and complete.   [place]

## 2022-01-28 NOTE — Assessment & Plan Note (Addendum)
Dg lumbar and pelvic xrays d/t unusual presentation  Discussed pain medications -- d/t his multiple chronic conditions and current medications really have no options; discussed d/c imipramine temporarily and taking tramadol, pt declined Would give toradol IM in office-- but pt took 6 ASA this morning-- 3,000 mg. Advised pt strongly not to take ASA at those doses. Advised he can return tomorrow for injection if he wishes.  rx medrol dose pack

## 2022-01-28 NOTE — Assessment & Plan Note (Signed)
Well controlled on current medications Reviewed last cmp--pt has f/u with nephrologist in august to repeat bw, will not repeat today F/u 6 mo

## 2022-01-28 NOTE — Assessment & Plan Note (Signed)
Managed by nephrology, next appt 8/23

## 2022-03-03 ENCOUNTER — Other Ambulatory Visit: Payer: Self-pay | Admitting: Physician Assistant

## 2022-03-11 DIAGNOSIS — I129 Hypertensive chronic kidney disease with stage 1 through stage 4 chronic kidney disease, or unspecified chronic kidney disease: Secondary | ICD-10-CM | POA: Diagnosis not present

## 2022-03-11 DIAGNOSIS — E875 Hyperkalemia: Secondary | ICD-10-CM | POA: Diagnosis not present

## 2022-03-11 DIAGNOSIS — E871 Hypo-osmolality and hyponatremia: Secondary | ICD-10-CM | POA: Diagnosis not present

## 2022-03-11 DIAGNOSIS — N2581 Secondary hyperparathyroidism of renal origin: Secondary | ICD-10-CM | POA: Diagnosis not present

## 2022-03-11 DIAGNOSIS — N1832 Chronic kidney disease, stage 3b: Secondary | ICD-10-CM | POA: Diagnosis not present

## 2022-03-18 DIAGNOSIS — I129 Hypertensive chronic kidney disease with stage 1 through stage 4 chronic kidney disease, or unspecified chronic kidney disease: Secondary | ICD-10-CM | POA: Diagnosis not present

## 2022-03-18 DIAGNOSIS — N1831 Chronic kidney disease, stage 3a: Secondary | ICD-10-CM | POA: Diagnosis not present

## 2022-04-20 ENCOUNTER — Telehealth: Payer: Self-pay | Admitting: *Deleted

## 2022-04-20 NOTE — Patient Outreach (Signed)
  Care Coordination   04/20/2022 Name: Richard Bean MRN: [id number] DOB: January 23, 1951   Care Coordination Outreach Attempts:  An unsuccessful telephone outreach was attempted today to offer the patient information about available care coordination services as a benefit of their health plan.   Follow Up Plan:  Additional outreach attempts will be made to offer the patient care coordination information and services.   Encounter Outcome:  No Answer  Care Coordination Interventions Activated:  No   Care Coordination Interventions:  No, not indicated    Jacqlyn Larsen [facility], [place] RN Care Coordinator [phone]

## 2022-04-22 ENCOUNTER — Telehealth: Payer: Self-pay | Admitting: *Deleted

## 2022-04-22 NOTE — Patient Outreach (Signed)
  Care Coordination   04/22/2022 Name: LOUIE FLENNER MRN: [id number] DOB: 02-Jun-1951   Care Coordination Outreach Attempts:  A second unsuccessful outreach was attempted today to offer the patient with information about available care coordination services as a benefit of their health plan.     Follow Up Plan:  Additional outreach attempts will be made to offer the patient care coordination information and services.   Encounter Outcome:  No Answer  Care Coordination Interventions Activated:  No   Care Coordination Interventions:  No, not indicated    Jacqlyn Larsen [facility], [place] RN Care Coordinator [phone]

## 2022-04-24 ENCOUNTER — Telehealth: Payer: Self-pay | Admitting: *Deleted

## 2022-04-24 NOTE — Patient Outreach (Signed)
  Care Coordination   04/24/2022 Name: Richard Bean MRN: [id number] DOB: 07/19/1951   Care Coordination Outreach Attempts:  A third unsuccessful outreach was attempted today to offer the patient with information about available care coordination services as a benefit of their health plan.   Follow Up Plan:  No further outreach attempts will be made at this time. We have been unable to contact the patient to offer or enroll patient in care coordination services  Encounter Outcome:  No Answer  Care Coordination Interventions Activated:  No   Care Coordination Interventions:  No, not indicated    Jacqlyn Larsen [facility], BSN [facility] RN Care Coordinator [phone]

## 2022-05-20 ENCOUNTER — Ambulatory Visit (INDEPENDENT_AMBULATORY_CARE_PROVIDER_SITE_OTHER): Payer: Medicare Other | Admitting: Physician Assistant

## 2022-05-20 ENCOUNTER — Encounter: Payer: Self-pay | Admitting: Physician Assistant

## 2022-05-20 VITALS — BP 138/73 | HR 93 | Wt 225.5 lb

## 2022-05-20 DIAGNOSIS — I10 Essential (primary) hypertension: Secondary | ICD-10-CM | POA: Diagnosis not present

## 2022-05-20 DIAGNOSIS — Z23 Encounter for immunization: Secondary | ICD-10-CM | POA: Diagnosis not present

## 2022-05-20 DIAGNOSIS — Z8249 Family history of ischemic heart disease and other diseases of the circulatory system: Secondary | ICD-10-CM | POA: Diagnosis not present

## 2022-05-20 DIAGNOSIS — E782 Mixed hyperlipidemia: Secondary | ICD-10-CM | POA: Diagnosis not present

## 2022-05-20 NOTE — Progress Notes (Signed)
I,Sha'taria Tyson,acting as a Education administrator for [employer], PA-C.,have documented all relevant documentation on the behalf of Mikey Kirschner, PA-C,as directed by  Mikey Kirschner, PA-C while in the presence of Mikey Kirschner, PA-C.   Established patient visit   Patient: Richard Bean   DOB: 1951/04/28   71 y.o. Male  MRN: [id number] Visit Date: 05/20/2022  Today's healthcare provider: Mikey Kirschner, PA-C   Cc. Family history of heart disease  Subjective    HPI  Patient would like to know if there is anything he can do to be proactive and checking to make sure he will not have heart issues. States in the 90s one of his brother had a stent put in and hisself and other brother thought it was just due to health but now the other brother has had a stent placed and he is healthy. Patient is wondering if this is something he should be concerned about and is there anything he can do to check his heart health  Pt does have risk factors: HTN, HLD, smoker.  Medications: Outpatient Medications Prior to Visit  Medication Sig  . amLODipine (NORVASC) 5 MG tablet Take 5 mg by mouth daily.  Marland Kitchen atorvastatin (LIPITOR) 20 MG tablet TAKE 1 TABLET DAILY  . enalapril (VASOTEC) 5 MG tablet Take 5 mg by mouth daily.  Marland Kitchen escitalopram (LEXAPRO) 20 MG tablet Take 1 tablet (20 mg total) by mouth daily.  . eszopiclone (LUNESTA) 1 MG TABS tablet TAKE 1 TABLET AT BEDTIME AS NEEDED FOR SLEEP. TAKE IMMEDIATELY BEFORE BEDTIME  . imipramine (TOFRANIL) 25 MG tablet TAKE 1 TABLET AT BEDTIME  . methylPREDNISolone (MEDROL DOSEPAK) 4 MG TBPK tablet Take 6 pills on day 1, 5 pills on day 2, 4 pills on day 3, 3 pills on day 4, 2 pills on day 5, 1 pill on day 6  . omeprazole (PRILOSEC) 40 MG capsule Take 1 capsule (40 mg total) by mouth 2 (two) times daily before a meal.  . propranolol (INDERAL) 10 MG tablet Take 10 mg by mouth daily as needed.  . sildenafil (REVATIO) 20 MG tablet Take 1 tablet (20 mg total) by mouth as  directed.   No facility-administered medications prior to visit.    Review of Systems  Constitutional:  Negative for fatigue and fever.  Respiratory:  Negative for cough and shortness of breath.   Cardiovascular:  Negative for chest pain, palpitations and leg swelling.  Neurological:  Negative for dizziness and headaches.      Objective    Blood pressure 138/73, pulse 93, weight 225 lb 8 oz (102.3 kg), SpO2 100 %.   Physical Exam Vitals reviewed.  Constitutional:      Appearance: He is not ill-appearing.  HENT:     Head: Normocephalic.  Eyes:     Conjunctiva/sclera: Conjunctivae normal.  Cardiovascular:     Rate and Rhythm: Normal rate.  Pulmonary:     Effort: Pulmonary effort is normal. No respiratory distress.  Neurological:     General: No focal deficit present.     Mental Status: He is alert and oriented to person, place, and time.  Psychiatric:        Mood and Affect: Mood normal.        Behavior: Behavior normal.     No results found for any visits on 05/20/22.  Assessment & Plan     Problem List Items Addressed This Visit       Cardiovascular and Mediastinum   Hypertension  Relevant Orders   CT CARDIAC SCORING (SELF PAY ONLY)     Other   Mixed hyperlipidemia   Relevant Orders   CT CARDIAC SCORING (SELF PAY ONLY)   Family history of heart disease - Primary    As pt is asymptomatic, discussed ordering a cardiac CT calcium score Risk factors of HTN, HLD. Smoker, family history Pt unable to take ASA 2/2 kidney disease On statin, well controlled BP. Current smoker not amenable to smoking cessation      Relevant Orders   CT CARDIAC SCORING (SELF PAY ONLY)   Other Visit Diagnoses     Flu vaccine need       Relevant Orders   Flu Vaccine QUAD High Dose(Fluad)        Return in about 4 months (around 09/20/2022) for CPE.      I, Mikey Kirschner, PA-C have reviewed all documentation for this visit. The documentation on  05/20/2022 for the exam,  diagnosis, procedures, and orders are all accurate and complete.   Mikey Kirschner, PA-C [facility] [address] [place], [place], [address] Office: [phone] Fax: [place]

## 2022-05-20 NOTE — Assessment & Plan Note (Addendum)
As pt is asymptomatic, discussed ordering a cardiac CT calcium score Risk factors of HTN, HLD. Smoker, family history Pt unable to take ASA 2/2 kidney disease On statin, well controlled BP. Current smoker not amenable to smoking cessation

## 2022-05-28 ENCOUNTER — Ambulatory Visit
Admission: RE | Admit: 2022-05-28 | Discharge: 2022-05-28 | Disposition: A | Discharge status: Home / Self Care | Payer: Medicare Other | Source: Ambulatory Visit | Attending: Physician Assistant | Admitting: Physician Assistant

## 2022-05-28 DIAGNOSIS — E782 Mixed hyperlipidemia: Secondary | ICD-10-CM | POA: Insufficient documentation

## 2022-05-28 DIAGNOSIS — I10 Essential (primary) hypertension: Secondary | ICD-10-CM | POA: Insufficient documentation

## 2022-05-28 DIAGNOSIS — Z8249 Family history of ischemic heart disease and other diseases of the circulatory system: Secondary | ICD-10-CM | POA: Insufficient documentation

## 2022-05-29 ENCOUNTER — Other Ambulatory Visit: Payer: Self-pay | Admitting: Physician Assistant

## 2022-05-29 DIAGNOSIS — E782 Mixed hyperlipidemia: Secondary | ICD-10-CM

## 2022-05-29 DIAGNOSIS — R931 Abnormal findings on diagnostic imaging of heart and coronary circulation: Secondary | ICD-10-CM

## 2022-05-29 DIAGNOSIS — Z8249 Family history of ischemic heart disease and other diseases of the circulatory system: Secondary | ICD-10-CM

## 2022-06-01 ENCOUNTER — Other Ambulatory Visit: Payer: Self-pay | Admitting: *Deleted

## 2022-06-01 ENCOUNTER — Other Ambulatory Visit: Payer: Medicare Other

## 2022-06-01 DIAGNOSIS — Z8546 Personal history of malignant neoplasm of prostate: Secondary | ICD-10-CM

## 2022-06-02 ENCOUNTER — Ambulatory Visit: Payer: Medicare Other | Admitting: Psychiatry

## 2022-06-02 LAB — PSA: Prostate Specific Ag, Serum: <0.1 ng/mL (ref 0.0–4.0)

## 2022-06-03 ENCOUNTER — Encounter: Payer: Self-pay | Admitting: Gastroenterology

## 2022-06-03 ENCOUNTER — Ambulatory Visit (INDEPENDENT_AMBULATORY_CARE_PROVIDER_SITE_OTHER): Payer: Medicare Other | Admitting: Gastroenterology

## 2022-06-03 VITALS — BP 153/82 | HR 86 | Temp 98.2°F | Ht 72.0 in | Wt 226.2 lb

## 2022-06-03 DIAGNOSIS — K7460 Unspecified cirrhosis of liver: Secondary | ICD-10-CM

## 2022-06-03 DIAGNOSIS — K2270 Barrett's esophagus without dysplasia: Secondary | ICD-10-CM

## 2022-06-03 DIAGNOSIS — K7581 Nonalcoholic steatohepatitis (NASH): Secondary | ICD-10-CM | POA: Diagnosis not present

## 2022-06-03 NOTE — Progress Notes (Signed)
Richard Darby, MD [address]  [place], [place] [address]  Main: [phone]  Fax: [phone]    Gastroenterology Consultation  Referring Provider:     Mikey Kirschner, PA-C Primary Care Physician:  Mikey Kirschner, PA-C Primary Gastroenterologist:  Dr. Cephas Bean Reason for Consultation: cirrhosis of liver, GERD, Barrett's esophagus        HPI:   MONTREL DONAHOE is a 71 y.o. male referred by Dr. Mikey Kirschner, PA-C  for consultation & management of possible seizures evaluated.  Patient with history of obesity, chronic kidney disease, hypertension is seen in consultation for possible cirrhosis of liver.  This was identified on CT chest performed for lung cancer screening.  Therefore, referred to GI by Dr. [name].  Patient does not have any signs or symptoms to suggest chronic liver disease.  He does admit to drinking 3-4 drinks of alcohol once or twice per week.  He does smoke cigarettes, 1 pack/day.  His labs revealed mild elevated ALT in 09/2019.  Repeat LFTs were normal.  No evidence of thrombocytopenia, anemia.  Follow-up visit 07/03/2020 Patient underwent upper endoscopy, found to have long segment Barrett's esophagus without evidence of dysplasia.  He also had erosive esophagitis.  I started him on omeprazole 40 mg twice daily, patient did not receive his medication from [facility] yet.  Patient denies any symptoms today.  He has made significant changes in his eating habits, significantly cut back on red meat, high-calorie foods.  He does not have any concerns today.  He continues to smoke tobacco  Follow-up visit 06/03/2022 Patient is here for follow-up of compensated cirrhosis of liver, follow-up of chronic GERD and long segment Barrett's esophagus.  Patient is currently taking omeprazole 40 mg daily with good symptoms well under control.  He does not have any abdominal swelling, distention, swelling of legs, bleeding gums or epistaxis or  rectal bleeding.  NSAIDs: None  Antiplts/Anticoagulants/Anti thrombotics: None  GI Procedures:   Underwent colonoscopy in 10/22/2017, transverse colon polyp was removed, pathology consistent with tubular adenoma   Upper endoscopy 06/26/2020 - Mucosal changes in the duodenum. Biopsied. - Normal stomach. - Esophageal mucosal changes consistent with long-segment Barrett's esophagus. Biopsied. - LA Grade C reflux esophagitis with no bleeding.  DIAGNOSIS:  A. DUODENUM, SECOND PORTION; COLD BIOPSY:  - MILD ACTIVE DUODENITIS.  - NEGATIVE FOR DYSPLASIA AND MALIGNANCY.   B. ESOPHAGUS, 43 CM; COLD BIOPSY:  - GASTRIC CARDIAC TYPE MUCOSA WITH MILD NON-SPECIFIC CHRONIC GASTRITIS.  - SQUAMOUS MUCOSA IS NOT PRESENT FOR EVALUATION.  - NEGATIVE FOR INTESTINAL METAPLASIA, DYSPLASIA, AND MALIGNANCY.   C. ESOPHAGUS, 41 CM; COLD BIOPSY:  - GLANDULAR COLUMNAR MUCOSA WITH INTESTINAL METAPLASIA, CONSISTENT WITH  BARRETT'S ESOPHAGUS GIVEN ENDOSCOPIC FINDINGS.  - SEPARATE SMALL FRAGMENT OF UNREMARKABLE SQUAMOUS MUCOSA.  - NEGATIVE FOR DYSPLASIA AND MALIGNANCY.  - DEEPER SECTIONS EXAMINED.   D. ESOPHAGUS, 39 CM; COLD BIOPSY:  - SQUAMOCOLUMNAR MUCOSA WITH INTESTINAL METAPLASIA, CONSISTENT WITH  BARRETT'S ESOPHAGUS GIVEN ENDOSCOPIC FINDINGS.  - NEGATIVE FOR DYSPLASIA AND MALIGNANCY.   Colonoscopy 09/25/2021 - One diminutive polyp in the descending colon, removed with a cold biopsy forceps. Resected and retrieved. - The distal rectum and anal verge are normal on retroflexion view. - Stool in the entire examined colon. - Non-bleeding external and internal hemorrhoids. DIAGNOSIS:  A. COLON POLYP, DESCENDING; COLD BIOPSY:  - HYPERPLASTIC POLYP.  - NEGATIVE FOR DYSPLASIA AND MALIGNANCY.   Upper endoscopy 10/02/2020 - Normal duodenal bulb and second  portion of the duodenum. - Normal stomach. - Esophageal mucosal changes secondary to established long-segment Barrett's disease. Biopsied.  DIAGNOSIS:  A.  ESOPHAGUS, 41 CM; COLD BIOPSY:  - BARRETT'S ESOPHAGUS.  - NEGATIVE FOR DYSPLASIA AND MALIGNANCY.   B. ESOPHAGUS, 40 CM; COLD BIOPSY:  - BARRETT'S ESOPHAGUS.  - NEGATIVE FOR DYSPLASIA AND MALIGNANCY.   C. ESOPHAGUS, 39 CM; COLD BIOPSY:  - BARRETT'S ESOPHAGUS.  - NEGATIVE FOR DYSPLASIA AND MALIGNANCY.   D. ESOPHAGUS, 38 CM; COLD BIOPSY:  - BARRETT'S ESOPHAGUS.  - NEGATIVE FOR DYSPLASIA AND MALIGNANCY.   Patient is a retired Financial planner.  He lives alone  Past Medical History:  Diagnosis Date   Actinic keratosis    Anxiety    Cancer ([place])    Depression    GERD (gastroesophageal reflux disease)    H/O prostate cancer    Heart murmur    Kidney disease    Liver disease    Panic disorder with agoraphobia     Past Surgical History:  Procedure Laterality Date   ANKLE SURGERY Right    COLONOSCOPY WITH PROPOFOL N/A 09/25/2021   Procedure: COLONOSCOPY WITH PROPOFOL;  Surgeon: Lin Landsman, MD;  Location: [facility] ENDOSCOPY;  Service: Gastroenterology;  Laterality: N/A;   ESOPHAGOGASTRODUODENOSCOPY (EGD) WITH PROPOFOL N/A 06/26/2020   Procedure: ESOPHAGOGASTRODUODENOSCOPY (EGD) WITH PROPOFOL;  Surgeon: Lin Landsman, MD;  Location: [facility] ENDOSCOPY;  Service: Gastroenterology;  Laterality: N/A;   ESOPHAGOGASTRODUODENOSCOPY (EGD) WITH PROPOFOL N/A 10/02/2020   Procedure: ESOPHAGOGASTRODUODENOSCOPY (EGD) WITH PROPOFOL;  Surgeon: Lin Landsman, MD;  Location: [facility] ENDOSCOPY;  Service: Gastroenterology;  Laterality: N/A;   PROSTATE SURGERY      Current Outpatient Medications:    amLODipine (NORVASC) 5 MG tablet, Take 5 mg by mouth daily., Disp: , Rfl:    atorvastatin (LIPITOR) 20 MG tablet, TAKE 1 TABLET DAILY, Disp: 90 tablet, Rfl: 3   enalapril (VASOTEC) 5 MG tablet, Take 5 mg by mouth daily., Disp: , Rfl:    escitalopram (LEXAPRO) 20 MG tablet, Take 1 tablet (20 mg total) by mouth daily., Disp: 90 tablet, Rfl: 1   eszopiclone (LUNESTA) 1 MG TABS tablet, TAKE 1 TABLET  AT BEDTIME AS NEEDED FOR SLEEP. TAKE IMMEDIATELY BEFORE BEDTIME, Disp: 30 tablet, Rfl: 5   imipramine (TOFRANIL) 25 MG tablet, TAKE 1 TABLET AT BEDTIME, Disp: 90 tablet, Rfl: 3   omeprazole (PRILOSEC) 40 MG capsule, Take 1 capsule (40 mg total) by mouth 2 (two) times daily before a meal., Disp: 180 capsule, Rfl: 1   propranolol (INDERAL) 10 MG tablet, Take 10 mg by mouth daily as needed., Disp: , Rfl:    sildenafil (REVATIO) 20 MG tablet, Take 1 tablet (20 mg total) by mouth as directed., Disp: 90 tablet, Rfl: 3   Family History  Problem Relation Age of Onset   COPD Mother    Prostate cancer Father    Cancer - Lung Father    Depression Sister    Stomach cancer Sister    Alcohol abuse Brother    Heart block Brother    Breast cancer Sister    Prostate cancer Brother    Skin cancer Brother      Social History   Tobacco Use   Smoking status: Every Day    Packs/day: 1.00    Years: 47.00    Total pack years: 47.00    Types: Cigarettes    Start date: 05/06/1971   Smokeless tobacco: Never  Vaping Use   Vaping Use: Never used  Substance  Use Topics   Alcohol use: Yes    Alcohol/week: 16.0 standard drinks of alcohol    Types: 6 Cans of beer, 10 Shots of liquor per week    Comment: in 1 to 2 times a week    Drug use: No    Allergies as of 06/03/2022 - Review Complete 06/03/2022  Allergen Reaction Noted   Chlorpheniramine-phenylephrine Other (See Comments) and Nausea Only 06/07/2013    Review of Systems:    All systems reviewed and negative except where noted in HPI.   Physical Exam:  BP (!) 153/82 (BP Location: Left Arm, Patient Position: Sitting, Cuff Size: Normal)   Pulse 86   Temp 98.2 F (36.8 C) (Oral)   Ht 6' (1.829 m)   Wt 226 lb 4 oz (102.6 kg)   BMI 30.69 kg/m  No LMP for male patient.  General:   Alert,  Well-developed, well-nourished, pleasant and cooperative in NAD Head:  Normocephalic and atraumatic. Eyes:  Sclera clear, no icterus.   Conjunctiva  pink. Ears:  Normal auditory acuity. Nose:  No deformity, discharge, or lesions. Mouth:  No deformity or lesions,oropharynx pink & moist. Neck:  Supple; no masses or thyromegaly. Lungs:  Respirations even and unlabored.  Clear throughout to auscultation.   No wheezes, crackles, or rhonchi. No acute distress. Heart:  Regular rate and rhythm; no murmurs, clicks, rubs, or gallops. Abdomen:  Normal bowel sounds. Soft, obese, non-tender and non-distended without masses, hepatosplenomegaly or hernias noted.  No guarding or rebound tenderness.   Rectal: Not performed Msk:  Symmetrical without gross deformities. Good, equal movement & strength bilaterally. Pulses:  Normal pulses noted. Extremities:  No clubbing or edema.  No cyanosis. Neurologic:  Alert and oriented x3;  grossly normal neurologically. Skin: Spider nevi on upper anterior chest wall, intact without significant lesions or rashes. No jaundice. Lymph Nodes:  No significant cervical adenopathy. Psych:  Alert and cooperative. Normal mood and affect.  Imaging Studies: Reviewed  Assessment and Plan:   BILAL MANZER is a 71 y.o. pleasant Caucasian male with obesity, hypertension, CKD is seen in consultation for cirrhosis of liver.  Patient is at risk for fatty liver and progression to cirrhosis.  Hepatitis B surface antigen negative, HCV antibody negative, hepatitis B core antibody negative.  Secondary liver disease work-up of cirrhosis is negative  Cirrhosis secondary to fatty liver Secondary liver disease work-up is negative EGD did not reveal varices Ultrasound liver and Dopplers did not reveal any portal vein thrombosis, no evidence of liver lesions, AFP normal Repeat ultrasound every 6 months Discussed about lifestyle modification including diet and exercise, weight loss Strongly advised to quit drinking alcohol Discussed about smoking cessation, patient is not ready to quit yet  Long segment Barrett's esophagus without  dysplasia (38cm to 41cm) Continue omeprazole 40 mg daily before meals indefinitely Antireflux lifestyle Recommend referral to [facility] GI specialist for management of long segment Barrett's esophagus and to discuss about ablation or cryotherapy given that patient is an active smoker  Follow up in 6 months  Richard Darby, MD

## 2022-06-03 NOTE — Progress Notes (Signed)
[facility] MD/PA/NP OP Progress Note  06/08/2022 2:03 PM Richard Bean  MRN:  [id number]  Chief Complaint:  Chief Complaint  Patient presents with   Follow-up   HPI:  This is a follow-up appointment for anxiety.  He states that he has been doing very well.  He enjoys going to a pool, and his boat.  He is planning to see his brother on Thanksgiving.  His daughter is doing very well.  She is traveling around all over the word.  Although he may feel a little anxious when he drives, it is not bothering him.  Although he feels down at times, he feels better the next day, and he likes to be active.  He sleeps well.  He denies change in appetite.  He denies SI.  He rarely takes propranolol. He denies panic attack. He drinks 3-4 beer or a liquor, up to twice a week. He does not feel good the next day if he were to drink more. He denies drug use.  He feels comfortable to stay on the current dose of the medication.   Daily routine: go to lake, shoot pool, plays with his dog Exercise: walk 3 miles every day Employment: retired in 2006, Financial planner Support: brothers Household: by himself Marital status: separated, married twice Number of children: 1 daughter in [place], 36 yo He grew [place] in [place]. He had a "fine" childhood. he loved his parents. He has 2 living brother. He eats lunch with them weekly, occasionally with his cousin as well  Visit Diagnosis:    ICD-10-CM   1. Panic disorder  F41.0       Past Psychiatric History: Please see initial evaluation for full details. I have reviewed the history. No updates at this time.     Past Medical History:  Past Medical History:  Diagnosis Date   Actinic keratosis    Anxiety    Cancer ([place])    Depression    GERD (gastroesophageal reflux disease)    H/O prostate cancer    Heart murmur    Kidney disease    Liver disease    Panic disorder with agoraphobia     Past Surgical History:  Procedure Laterality Date   ANKLE SURGERY Right     COLONOSCOPY WITH PROPOFOL N/A 09/25/2021   Procedure: COLONOSCOPY WITH PROPOFOL;  Surgeon: Lin Landsman, MD;  Location: [facility] ENDOSCOPY;  Service: Gastroenterology;  Laterality: N/A;   ESOPHAGOGASTRODUODENOSCOPY (EGD) WITH PROPOFOL N/A 06/26/2020   Procedure: ESOPHAGOGASTRODUODENOSCOPY (EGD) WITH PROPOFOL;  Surgeon: Lin Landsman, MD;  Location: [facility] ENDOSCOPY;  Service: Gastroenterology;  Laterality: N/A;   ESOPHAGOGASTRODUODENOSCOPY (EGD) WITH PROPOFOL N/A 10/02/2020   Procedure: ESOPHAGOGASTRODUODENOSCOPY (EGD) WITH PROPOFOL;  Surgeon: Lin Landsman, MD;  Location: [facility] ENDOSCOPY;  Service: Gastroenterology;  Laterality: N/A;   PROSTATE SURGERY      Family Psychiatric History: Please see initial evaluation for full details. I have reviewed the history. No updates at this time.     Family History:  Family History  Problem Relation Age of Onset   COPD Mother    Prostate cancer Father    Cancer - Lung Father    Depression Sister    Stomach cancer Sister    Alcohol abuse Brother    Heart block Brother    Breast cancer Sister    Prostate cancer Brother    Skin cancer Brother     Social History:  Social History   Socioeconomic History   Marital status: Married  Spouse name: Not on file   Number of children: Not on file   Years of education: Not on file   Highest education level: Not on file  Occupational History   Not on file  Tobacco Use   Smoking status: Every Day    Packs/day: 1.00    Years: 47.00    Total pack years: 47.00    Types: Cigarettes    Start date: 05/06/1971   Smokeless tobacco: Never  Vaping Use   Vaping Use: Never used  Substance and Sexual Activity   Alcohol use: Yes    Alcohol/week: 16.0 standard drinks of alcohol    Types: 6 Cans of beer, 10 Shots of liquor per week    Comment: in 1 to 2 times a week    Drug use: No   Sexual activity: Yes  Other Topics Concern   Not on file  Social History Narrative   Not on file   Social  Determinants of Health   Financial Resource Strain: Low Risk  (04/04/2018)   Overall Financial Resource Strain (CARDIA)    Difficulty of Paying Living Expenses: Not hard at all  Food Insecurity: No Food Insecurity (04/04/2018)   Hunger Vital Sign    Worried About Running Out of Food in the Last Year: Never true    Ran Out of Food in the Last Year: Never true  Transportation Needs: No Transportation Needs (04/04/2018)   PRAPARE - Hydrologist (Medical): No    Lack of Transportation (Non-Medical): No  Physical Activity: Inactive (04/04/2018)   Exercise Vital Sign    Days of Exercise per Week: 0 days    Minutes of Exercise per Session: 0 min  Stress: Not on file  Social Connections: Not on file    Allergies:  Allergies  Allergen Reactions   Chlorpheniramine-Phenylephrine Other (See Comments) and Nausea Only    Actifed Makes cold symptoms worse Other reaction(s): Unknown Actifed Makes cold symptoms worse Other reaction(s): Unknown     Metabolic Disorder Labs: Lab Results  Component Value Date   HGBA1C 5.4 09/16/2021   No results found for: "PROLACTIN" Lab Results  Component Value Date   CHOL 176 11/13/2021   TRIG 130 11/13/2021   HDL 70 11/13/2021   CHOLHDL 2.5 11/13/2021   LDLCALC 84 11/13/2021   LDLCALC 149 (H) 09/16/2021   Lab Results  Component Value Date   TSH 1.710 09/16/2021   TSH 1.270 03/09/2019    Therapeutic Level Labs: No results found for: "LITHIUM" No results found for: "VALPROATE" No results found for: "CBMZ"  Current Medications: Current Outpatient Medications  Medication Sig Dispense Refill   amLODipine (NORVASC) 5 MG tablet Take 5 mg by mouth daily.     atorvastatin (LIPITOR) 20 MG tablet TAKE 1 TABLET DAILY 90 tablet 3   enalapril (VASOTEC) 5 MG tablet Take 5 mg by mouth daily.     escitalopram (LEXAPRO) 20 MG tablet Take 1 tablet (20 mg total) by mouth daily. 90 tablet 1   eszopiclone (LUNESTA) 1 MG TABS tablet  TAKE 1 TABLET AT BEDTIME AS NEEDED FOR SLEEP. TAKE IMMEDIATELY BEFORE BEDTIME 30 tablet 5   imipramine (TOFRANIL) 25 MG tablet TAKE 1 TABLET AT BEDTIME 90 tablet 3   omeprazole (PRILOSEC) 40 MG capsule Take 1 capsule (40 mg total) by mouth 2 (two) times daily before a meal. 180 capsule 1   propranolol (INDERAL) 10 MG tablet Take 10 mg by mouth daily as needed.  sildenafil (REVATIO) 20 MG tablet Take 1 tablet (20 mg total) by mouth as directed. 90 tablet 3   No current facility-administered medications for this visit.     Musculoskeletal: Strength & Muscle Tone: within normal limits Gait & Station: normal Patient leans: N/A  Psychiatric Specialty Exam: Review of Systems  Blood pressure (!) 158/77, pulse 94, temperature 97.9 F (36.6 C), temperature source Temporal, height 6' (1.829 m), weight 228 lb 3.2 oz (103.5 kg).Body mass index is 30.95 kg/m.  General Appearance: Fairly Groomed  Eye Contact:  Good  Speech:  Clear and Coherent  Volume:  Normal  Mood:   good  Affect:  Appropriate, Congruent, and Full Range  Thought Process:  Coherent  Orientation:  Full (Time, Place, and Person)  Thought Content: Logical   Suicidal Thoughts:  No  Homicidal Thoughts:  No  Memory:  Immediate;   Good  Judgement:  Good  Insight:  Good  Psychomotor Activity:  Normal  Concentration:  Concentration: Good and Attention Span: Good  Recall:  Good  Fund of Knowledge: Good  Language: Good  Akathisia:  No  Handed:  Right  AIMS (if indicated): not done  Assets:  Communication Skills Desire for Improvement  ADL's:  Intact  Cognition: WNL  Sleep:  Good   Screenings: AUDIT    Flowsheet Row Clinical Support from 04/04/2020 in [place]  Alcohol Use Disorder Identification Test Final Score (AUDIT) 4      GAD-7    Flowsheet Row Office Visit from 06/08/2022 in [place]  Total GAD-7 Score 0      PHQ2-9    [place] Office Visit from  06/08/2022 in [place] Office Visit from 05/20/2022 in [place] Visit from 01/28/2022 in [place] Visit from 01/01/2022 in [place] from 08/27/2021 in [place]  PHQ-2 Total Score 2 1 1 1  0  PHQ-9 Total Score 3 5 2  -- 2      Flowsheet Row Admission (Discharged) from 09/25/2021 in [place] Admission (Discharged) from 09/24/2021 in [place] Video Visit from 05/20/2021 in [place] No Risk No Risk No Risk        Assessment and Plan:  Richard Bean is a 71 y.o. year old male with a history of  anxiety,CKD stage III B secondary to chronic over use of aspirin and NSAID, secondary hyperparathyroidism,  hypertension, cirrhosis of liver secondary to fatty liver, history of prostate cancer,, who presents for follow up appointment for below.   1. Panic disorder  He denies any significant mood symptoms since the last visit. He enjoys seeing his friends, siblings.  Will continue current dose of Lexapro to target anxiety. Noted that he did have relapse in his mood symptoms when he tried to taper it off a few years ago.  Discussed potential risk of QTc prolongation.    Plan 1. Continue lexapro 20 mg daily (QTc 456 msec on 12/2021. IRBBB, HR 80)- he declined a refill 3. Next appointment: 5/13 at 11 AM, in person.  He agrees to cancel this visit if his PCP feels comfortable taking over the prescription of Lexapro - on imipramine for stress induced incontinence - on metoprolol 20 mg daily as needed for anxiety [email or web address]     The patient demonstrates the following risk factors for suicide: Chronic risk factors for suicide include: psychiatric disorder of  anxiety . Acute risk factors for suicide include: N/A. Protective factors for this patient  include: positive social support, coping skills, and hope for the future. Considering these factors, the overall suicide risk at this point appears to be low. Patient is appropriate for outpatient follow up.                Collaboration of Care: Collaboration of Care: Other reviewed notes in Epic  Patient/Guardian was advised Release of Information must be obtained prior to any record release in order to collaborate their care with an outside provider. Patient/Guardian was advised if they have not already done so to contact the registration department to sign all necessary forms in order for [place] to release information regarding their care.   Consent: Patient/Guardian gives verbal consent for treatment and assignment of benefits for services provided during this visit. Patient/Guardian expressed understanding and agreed to proceed.    Norman Clay, MD 06/08/2022, 2:03 PM

## 2022-06-03 NOTE — Patient Instructions (Addendum)
YOUR RUQ ultrasound is schedule for 06/09/2022 at 8:00am and arived at 7:30am at [place] [facility] Nothing to eat or drink after midnight.

## 2022-06-04 ENCOUNTER — Telehealth: Payer: Self-pay

## 2022-06-04 ENCOUNTER — Ambulatory Visit: Payer: Medicare Other | Admitting: Psychiatry

## 2022-06-04 LAB — AFP TUMOR MARKER: AFP, Serum, Tumor Marker: 9.2 ng/mL — ABNORMAL HIGH (ref 0.0–8.4)

## 2022-06-04 NOTE — Telephone Encounter (Signed)
-----   Message from Lin Landsman, MD sent at 06/04/2022  2:15 PM EST ----- Please inform patient that his serum AFP which is a tumor marker for liver cancer came back elevated compared to 2 years ago.  Therefore, patient not miss appointment for ultrasound.  Even if ultrasound comes back normal, then the next step would be to do a CT abdomen  Also, with regards to his long segment Barrett's esophagus, upon further reviewing the literature and guidelines, he will not need any further treatment at this time.  Please cancel the referral to [facility].  Please inform patient that he will need repeat upper endoscopy in 3 years for surveillance of Barrett's esophagus.  Please update on health maintenance  Sherri Sear, MD

## 2022-06-04 NOTE — Telephone Encounter (Signed)
Patient verbalized understanding of results of results his ultrasound is schedule for 06/09/2022 and will go to that appointment. He understood about canceling his [facility] referral. Called and cancel referral to [facility]

## 2022-06-05 ENCOUNTER — Ambulatory Visit (INDEPENDENT_AMBULATORY_CARE_PROVIDER_SITE_OTHER): Payer: Medicare Other | Admitting: Urology

## 2022-06-05 ENCOUNTER — Encounter: Payer: Self-pay | Admitting: Urology

## 2022-06-05 ENCOUNTER — Ambulatory Visit: Payer: TRICARE For Life (TFL) | Admitting: Urology

## 2022-06-05 VITALS — BP 160/84 | HR 83 | Ht 72.0 in | Wt 227.0 lb

## 2022-06-05 DIAGNOSIS — Z8546 Personal history of malignant neoplasm of prostate: Secondary | ICD-10-CM

## 2022-06-05 DIAGNOSIS — N5231 Erectile dysfunction following radical prostatectomy: Secondary | ICD-10-CM

## 2022-06-05 NOTE — Progress Notes (Signed)
06/05/2022 12:16 PM   Richard Bean 10/27/1950 [id number]  Referring provider: Mar Daring, PA-C [address] [place],  [place] [address]  Chief Complaint  Patient presents with   Follow-up    Urologic history: 1.  History of prostate cancer -RALP [place]; undetectable PSA   2.  Erectile dysfunction -Sildenafil prn   HPI: 71 y.o. male presents for annual follow-up.  Doing well since last visit No bothersome LUTS Denies dysuria, gross hematuria Denies flank, abdominal or pelvic pain Sildenafil 100 mg prn ED PSA 06/01/2022 remains undetectable at <0.1   PMH: Past Medical History:  Diagnosis Date   Actinic keratosis    Anxiety    Cancer ([place])    Depression    GERD (gastroesophageal reflux disease)    H/O prostate cancer    Heart murmur    Kidney disease    Liver disease    Panic disorder with agoraphobia     Surgical History: Past Surgical History:  Procedure Laterality Date   ANKLE SURGERY Right    COLONOSCOPY WITH PROPOFOL N/A 09/25/2021   Procedure: COLONOSCOPY WITH PROPOFOL;  Surgeon: Lin Landsman, MD;  Location: [facility] ENDOSCOPY;  Service: Gastroenterology;  Laterality: N/A;   ESOPHAGOGASTRODUODENOSCOPY (EGD) WITH PROPOFOL N/A 06/26/2020   Procedure: ESOPHAGOGASTRODUODENOSCOPY (EGD) WITH PROPOFOL;  Surgeon: Lin Landsman, MD;  Location: [facility] ENDOSCOPY;  Service: Gastroenterology;  Laterality: N/A;   ESOPHAGOGASTRODUODENOSCOPY (EGD) WITH PROPOFOL N/A 10/02/2020   Procedure: ESOPHAGOGASTRODUODENOSCOPY (EGD) WITH PROPOFOL;  Surgeon: Lin Landsman, MD;  Location: [facility] ENDOSCOPY;  Service: Gastroenterology;  Laterality: N/A;   PROSTATE SURGERY      Home Medications:  Allergies as of 06/05/2022       Reactions   Chlorpheniramine-phenylephrine Other (See Comments), Nausea Only   Actifed Makes cold symptoms worse Other reaction(s): Unknown Actifed Makes cold symptoms worse Other reaction(s): Unknown         Medication List        Accurate as of June 05, 2022 12:16 PM. If you have any questions, ask your nurse or doctor.          amLODipine 5 MG tablet Commonly known as: NORVASC Take 5 mg by mouth daily.   atorvastatin 20 MG tablet Commonly known as: LIPITOR TAKE 1 TABLET DAILY   enalapril 5 MG tablet Commonly known as: VASOTEC Take 5 mg by mouth daily.   escitalopram 20 MG tablet Commonly known as: LEXAPRO Take 1 tablet (20 mg total) by mouth daily.   eszopiclone 1 MG Tabs tablet Commonly known as: LUNESTA TAKE 1 TABLET AT BEDTIME AS NEEDED FOR SLEEP. TAKE IMMEDIATELY BEFORE BEDTIME   imipramine 25 MG tablet Commonly known as: TOFRANIL TAKE 1 TABLET AT BEDTIME   omeprazole 40 MG capsule Commonly known as: PRILOSEC Take 1 capsule (40 mg total) by mouth 2 (two) times daily before a meal.   propranolol 10 MG tablet Commonly known as: INDERAL Take 10 mg by mouth daily as needed.   sildenafil 20 MG tablet Commonly known as: REVATIO Take 1 tablet (20 mg total) by mouth as directed.        Allergies:  Allergies  Allergen Reactions   Chlorpheniramine-Phenylephrine Other (See Comments) and Nausea Only    Actifed Makes cold symptoms worse Other reaction(s): Unknown Actifed Makes cold symptoms worse Other reaction(s): Unknown     Family History: Family History  Problem Relation Age of Onset   COPD Mother    Prostate cancer Father    Cancer -  Lung Father    Depression Sister    Stomach cancer Sister    Alcohol abuse Brother    Heart block Brother    Breast cancer Sister    Prostate cancer Brother    Skin cancer Brother     Social History:  reports that he has been smoking cigarettes. He started smoking about 51 years ago. He has a 47.00 pack-year smoking history. He has never used smokeless tobacco. He reports current alcohol use of about 16.0 standard drinks of alcohol per week. He reports that he does not use drugs.   Physical Exam: BP (!)  160/84   Pulse 83   Ht 6' (1.829 m)   Wt 227 lb (103 kg)   BMI 30.79 kg/m   Constitutional:  Alert and oriented, No acute distress. HEENT: [place] AT, moist mucus membranes.  Trachea midline, no masses. Cardiovascular: No clubbing, cyanosis, or edema. Respiratory: Normal respiratory effort, no increased work of breathing. Psychiatric: Normal mood and affect.   Assessment & Plan:    1.  History of prostate cancer PSA remains undetectable He desires to continue annual follow-up  2.  Erectile dysfunction Stable on sildenafil   Abbie Sons, MD  [facility] [address], [place], [place] [address] [phone]

## 2022-06-08 ENCOUNTER — Encounter: Payer: Self-pay | Admitting: Psychiatry

## 2022-06-08 ENCOUNTER — Ambulatory Visit (INDEPENDENT_AMBULATORY_CARE_PROVIDER_SITE_OTHER): Payer: Medicare Other | Admitting: Psychiatry

## 2022-06-08 VITALS — BP 158/77 | HR 94 | Temp 97.9°F | Ht 72.0 in | Wt 228.2 lb

## 2022-06-08 DIAGNOSIS — F410 Panic disorder [episodic paroxysmal anxiety] without agoraphobia: Secondary | ICD-10-CM | POA: Diagnosis not present

## 2022-06-08 NOTE — Patient Instructions (Signed)
1.         Continue lexapro 20 mg daily 3.         Next appointment: 5/13 at 11 AM

## 2022-06-09 ENCOUNTER — Ambulatory Visit
Admission: RE | Admit: 2022-06-09 | Discharge: 2022-06-09 | Disposition: A | Discharge status: Home / Self Care | Payer: Medicare Other | Source: Ambulatory Visit | Attending: Gastroenterology | Admitting: Gastroenterology

## 2022-06-09 ENCOUNTER — Telehealth: Payer: Self-pay

## 2022-06-09 DIAGNOSIS — R772 Abnormality of alphafetoprotein: Secondary | ICD-10-CM

## 2022-06-09 DIAGNOSIS — K7460 Unspecified cirrhosis of liver: Secondary | ICD-10-CM

## 2022-06-09 DIAGNOSIS — K7581 Nonalcoholic steatohepatitis (NASH): Secondary | ICD-10-CM | POA: Diagnosis not present

## 2022-06-09 DIAGNOSIS — K760 Fatty (change of) liver, not elsewhere classified: Secondary | ICD-10-CM | POA: Diagnosis not present

## 2022-06-09 NOTE — Telephone Encounter (Signed)
Order the CT Scan. Called and got patient schedule for CT scan on 06/16/2022 arrive to out patient imaging at 2:00pm for a 4:00pm scan. Nothing to eat or drink starting at 12:00pm. Address is [address] [place], [place] [address]. Phone number is [phone]  Called and left a message for call back

## 2022-06-09 NOTE — Telephone Encounter (Signed)
Patient called back and verbalized understanding of results and instructions for CT scan

## 2022-06-09 NOTE — Telephone Encounter (Signed)
-----   Message from Lin Landsman, MD sent at 06/09/2022 10:57 AM EST ----- Caryl Pina  Please inform patient that ultrasound results were inconclusive.  Recommend CT liver mass protocol to evaluate for any liver lesions Dx: elevated serum AFP levels  Rohini Vanga

## 2022-06-16 ENCOUNTER — Ambulatory Visit
Admission: RE | Admit: 2022-06-16 | Discharge: 2022-06-16 | Disposition: A | Discharge status: Home / Self Care | Payer: Medicare Other | Source: Ambulatory Visit | Attending: Gastroenterology | Admitting: Gastroenterology

## 2022-06-16 DIAGNOSIS — K7581 Nonalcoholic steatohepatitis (NASH): Secondary | ICD-10-CM | POA: Insufficient documentation

## 2022-06-16 DIAGNOSIS — K7460 Unspecified cirrhosis of liver: Secondary | ICD-10-CM | POA: Insufficient documentation

## 2022-06-16 DIAGNOSIS — K760 Fatty (change of) liver, not elsewhere classified: Secondary | ICD-10-CM | POA: Diagnosis not present

## 2022-06-16 LAB — POCT I-STAT CREATININE: Creatinine, Ser: 1.5 mg/dL — ABNORMAL HIGH (ref 0.61–1.24)

## 2022-06-16 MED ORDER — IOHEXOL 300 MG/ML  SOLN
100.0000 mL | Freq: Once | INTRAMUSCULAR | Status: AC | PRN
  Administered 2022-06-16: 100 mL via INTRAVENOUS

## 2022-06-22 ENCOUNTER — Telehealth: Payer: Self-pay

## 2022-06-22 DIAGNOSIS — R772 Abnormality of alphafetoprotein: Secondary | ICD-10-CM

## 2022-06-22 DIAGNOSIS — K7460 Unspecified cirrhosis of liver: Secondary | ICD-10-CM

## 2022-06-22 NOTE — Telephone Encounter (Signed)
Tried to call patient but number is busy

## 2022-06-22 NOTE — Telephone Encounter (Signed)
Tried to call patient but line is busy

## 2022-06-22 NOTE — Telephone Encounter (Signed)
-----   Message from Lin Landsman, MD sent at 06/22/2022  8:47 AM EST ----- Please inform patient that the CT abdomen and pelvis did not reveal any liver lesions which is good news.  Recommend to recheck his serum AFP levels  RV

## 2022-06-22 NOTE — Telephone Encounter (Signed)
Patient verbalized understanding of results. He states he will come for labs tomorrow

## 2022-06-24 DIAGNOSIS — K7460 Unspecified cirrhosis of liver: Secondary | ICD-10-CM | POA: Diagnosis not present

## 2022-06-24 DIAGNOSIS — K7581 Nonalcoholic steatohepatitis (NASH): Secondary | ICD-10-CM | POA: Diagnosis not present

## 2022-06-24 DIAGNOSIS — R772 Abnormality of alphafetoprotein: Secondary | ICD-10-CM | POA: Diagnosis not present

## 2022-06-25 LAB — AFP TUMOR MARKER: AFP, Serum, Tumor Marker: 7.6 ng/mL (ref 0.0–8.4)

## 2022-06-26 ENCOUNTER — Telehealth: Payer: Self-pay

## 2022-06-26 NOTE — Telephone Encounter (Signed)
Patient verbalized understanding of results  

## 2022-06-26 NOTE — Telephone Encounter (Signed)
Called and left a message for call back  

## 2022-06-26 NOTE — Telephone Encounter (Signed)
-----   Message from Lin Landsman, MD sent at 06/25/2022  6:42 PM EST ----- Good to see that his serum AFP levels came back normal.  We will continue to monitor for now  RV

## 2022-07-09 ENCOUNTER — Other Ambulatory Visit: Payer: Self-pay | Admitting: Gastroenterology

## 2022-07-09 ENCOUNTER — Other Ambulatory Visit: Payer: Self-pay | Admitting: Urology

## 2022-07-09 DIAGNOSIS — F331 Major depressive disorder, recurrent, moderate: Secondary | ICD-10-CM

## 2022-07-28 ENCOUNTER — Encounter: Payer: Self-pay | Admitting: Cardiovascular Disease

## 2022-07-28 ENCOUNTER — Ambulatory Visit: Payer: Medicare Other | Attending: Cardiovascular Disease | Admitting: Cardiovascular Disease

## 2022-07-28 VITALS — BP 120/60 | HR 99 | Ht 72.0 in | Wt 227.0 lb

## 2022-07-28 DIAGNOSIS — F17200 Nicotine dependence, unspecified, uncomplicated: Secondary | ICD-10-CM

## 2022-07-28 DIAGNOSIS — E782 Mixed hyperlipidemia: Secondary | ICD-10-CM

## 2022-07-28 DIAGNOSIS — I10 Essential (primary) hypertension: Secondary | ICD-10-CM

## 2022-07-28 DIAGNOSIS — I200 Unstable angina: Secondary | ICD-10-CM | POA: Diagnosis not present

## 2022-07-28 DIAGNOSIS — R072 Precordial pain: Secondary | ICD-10-CM

## 2022-07-28 DIAGNOSIS — J432 Centrilobular emphysema: Secondary | ICD-10-CM | POA: Diagnosis not present

## 2022-07-28 DIAGNOSIS — I25118 Atherosclerotic heart disease of native coronary artery with other forms of angina pectoris: Secondary | ICD-10-CM | POA: Diagnosis not present

## 2022-07-28 MED ORDER — EZETIMIBE 10 MG PO TABS: 10.0000 mg | ORAL_TABLET | Freq: Every day | ORAL | 3 refills | Status: DC

## 2022-07-28 NOTE — Patient Instructions (Signed)
   Medication Instructions:  Please start zetia 10 mg daily  If you need a refill on your cardiac medications before your next appointment, please call your pharmacy.   Lab work: No new labs needed  Testing/Procedures:  [employer]  Your caregiver has ordered a Stress Test with nuclear imaging. The purpose of this test is to evaluate the blood supply to your heart muscle. This procedure is referred to as a "Non-Invasive Stress Test." This is because other than having an IV started in your vein, nothing is inserted or "invades" your body. Cardiac stress tests are done to find areas of poor blood flow to the heart by determining the extent of coronary artery disease (CAD). Some patients exercise on a treadmill, which naturally increases the blood flow to your heart, while others who are  unable to walk on a treadmill due to physical limitations have a pharmacologic/chemical stress agent called Lexiscan . This medicine will mimic walking on a treadmill by temporarily increasing your coronary blood flow.   Please note: these test may take anywhere between 2-4 hours to complete  PLEASE REPORT TO [place] AT THE FIRST DESK WILL DIRECT YOU WHERE TO GO  Date of Procedure:_____________________________________  Arrival Time for Procedure:______________________________   PLEASE NOTIFY THE OFFICE AT LEAST 24 HOURS IN ADVANCE IF YOU ARE UNABLE TO KEEP YOUR APPOINTMENT.  [phone] AND  PLEASE NOTIFY NUCLEAR MEDICINE AT [facility] AT LEAST 24 HOURS IN ADVANCE IF YOU ARE UNABLE TO KEEP YOUR APPOINTMENT. [phone]  How to prepare for your Myoview test:  Do not eat or drink after midnight No caffeine for 24 hours prior to test No smoking 24 hours prior to test. Your medication may be taken with water.  If your doctor stopped a medication because of this test, do not take that medication. Ladies, please do not wear dresses.  Skirts or pants are appropriate. Please  wear a short sleeve shirt. No perfume, cologne or lotion. Wear comfortable walking shoes. No heels!   Follow-Up: At [facility], you and your health needs are our priority.  As part of our continuing mission to provide you with exceptional heart care, we have created designated Provider Care Teams.  These Care Teams include your primary Cardiologist (physician) and Advanced Practice Providers (APPs -  Physician Assistants and Nurse Practitioners) who all work together to provide you with the care you need, when you need it.  You will need a follow up appointment in 12 months  Providers on your designated Care Team:   Murray Hodgkins, NP Christell Faith, PA-C [place] [place], [place]  COVID-19 Vaccine Information can be found at: [email or web address] For questions related to vaccine distribution or appointments, please email [email or web address] or call [phone].

## 2022-07-28 NOTE — Progress Notes (Signed)
Cardiology Office Note  Date:  07/28/2022   ID:  Richard Bean, DOB 04/13/1951, MRN [id number]  PCP:  Mikey Kirschner, PA-C   Chief Complaint  Patient presents with   New Patient (Initial Visit)    Ref by Mikey Kirschner, PA-C for evaluation of Coronary calcium score of 3749. Medications reviewed by the patient's bottles.     HPI:  [name] is a 72 year old gentleman with past medical history of Liver cirrhosis from NASH Markedly elevated coronary calcification greater than 3000 Chronic kidney disease Hyperlipidemia Smoking Previously seen by myself in clinic July 2018 for PACs, dizziness Who presents for coronary artery disease, elevated calcium score 3700  Reports that 2 of his brothers have coronary disease 1 requiring bypass, other requiring intervention/PCI Without in mind he underwent calcium scoring with score 3700  Images pulled up and reviewed with him today Diffuse three-vessel coronary calcification, heavy  Reports he continues to smoke 1 pack/day, difficulty quitting Previously declined cholesterol medication but appears he is taking and tolerating Lipitor 20 with improvement of his numbers total cholesterol 170s down from 230s or 260 in the past  No regular exercise program, has chronic shortness of breath  EKG personally reviewed by myself on todays visit Normal sinus rhythm rate 99 bpm incomplete right bundle branch block PVC no significant ST-T wave changes   PMH:   has a past medical history of Actinic keratosis, Anxiety, Cancer ([place]), Depression, GERD (gastroesophageal reflux disease), H/O prostate cancer, Heart murmur, Kidney disease, Liver disease, and Panic disorder with agoraphobia.  PSH:    Past Surgical History:  Procedure Laterality Date   ANKLE SURGERY Right    COLONOSCOPY WITH PROPOFOL N/A 09/25/2021   Procedure: COLONOSCOPY WITH PROPOFOL;  Surgeon: Lin Landsman, MD;  Location: [facility] ENDOSCOPY;  Service: Gastroenterology;   Laterality: N/A;   ESOPHAGOGASTRODUODENOSCOPY (EGD) WITH PROPOFOL N/A 06/26/2020   Procedure: ESOPHAGOGASTRODUODENOSCOPY (EGD) WITH PROPOFOL;  Surgeon: Lin Landsman, MD;  Location: [facility] ENDOSCOPY;  Service: Gastroenterology;  Laterality: N/A;   ESOPHAGOGASTRODUODENOSCOPY (EGD) WITH PROPOFOL N/A 10/02/2020   Procedure: ESOPHAGOGASTRODUODENOSCOPY (EGD) WITH PROPOFOL;  Surgeon: Lin Landsman, MD;  Location: [facility] ENDOSCOPY;  Service: Gastroenterology;  Laterality: N/A;   PROSTATE SURGERY      Current Outpatient Medications  Medication Sig Dispense Refill   amLODipine (NORVASC) 5 MG tablet Take 5 mg by mouth daily.     atorvastatin (LIPITOR) 20 MG tablet TAKE 1 TABLET DAILY 90 tablet 3   enalapril (VASOTEC) 5 MG tablet Take 5 mg by mouth daily.     escitalopram (LEXAPRO) 20 MG tablet Take 1 tablet (20 mg total) by mouth daily. 90 tablet 1   eszopiclone (LUNESTA) 1 MG TABS tablet TAKE 1 TABLET AT BEDTIME AS NEEDED FOR SLEEP. TAKE IMMEDIATELY BEFORE BEDTIME 30 tablet 5   ezetimibe (ZETIA) 10 MG tablet Take 1 tablet (10 mg total) by mouth daily. 90 tablet 3   imipramine (TOFRANIL) 25 MG tablet TAKE 1 TABLET AT BEDTIME 90 tablet 3   omeprazole (PRILOSEC) 40 MG capsule TAKE 1 CAPSULE TWICE A DAY BEFORE MEALS 180 capsule 3   propranolol (INDERAL) 10 MG tablet Take 10 mg by mouth daily as needed.     sildenafil (REVATIO) 20 MG tablet Take 1 tablet (20 mg total) by mouth as directed. 90 tablet 3   No current facility-administered medications for this visit.     Allergies:   Chlorpheniramine-phenylephrine   Social History:  The patient  reports that he has been smoking cigarettes.  He started smoking about 51 years ago. He has a 47.00 pack-year smoking history. He has never used smokeless tobacco. He reports current alcohol use of about 16.0 standard drinks of alcohol per week. He reports that he does not use drugs.   Family History:   family history includes Alcohol abuse in his brother;  Breast cancer in his sister; COPD in his mother; Cancer - Lung in his father; Depression in his sister; Heart block in his brother; Prostate cancer in his brother and father; Skin cancer in his brother; Stomach cancer in his sister.    Review of Systems: Review of Systems  Constitutional: Negative.   HENT: Negative.    Respiratory: Negative.    Cardiovascular: Negative.   Gastrointestinal: Negative.   Musculoskeletal: Negative.   Neurological: Negative.   Psychiatric/Behavioral: Negative.    All other systems reviewed and are negative.    PHYSICAL EXAM: VS:  BP 120/60 (BP Location: Left Arm, Patient Position: Sitting, Cuff Size: Normal)   Pulse 99   Ht 6' (1.829 m)   Wt 227 lb (103 kg)   SpO2 97%   BMI 30.79 kg/m  , BMI Body mass index is 30.79 kg/m. GEN: Well nourished, well developed, in no acute distress HEENT: normal Neck: no JVD, carotid bruits, or masses Cardiac: RRR; no murmurs, rubs, or gallops,no edema  Respiratory:  clear to auscultation bilaterally, normal work of breathing GI: soft, nontender, nondistended, + BS MS: no deformity or atrophy Skin: warm and dry, no rash Neuro:  Strength and sensation are intact Psych: euthymic mood, full affect   Recent Labs: 09/16/2021: Hemoglobin 16.6; Platelets 287; TSH 1.710 11/13/2021: ALT 18; BUN 21; Potassium 5.3; Sodium 141 06/16/2022: Creatinine, Ser 1.50    Lipid Panel Lab Results  Component Value Date   CHOL 176 11/13/2021   HDL 70 11/13/2021   LDLCALC 84 11/13/2021   TRIG 130 11/13/2021      Wt Readings from Last 3 Encounters:  07/28/22 227 lb (103 kg)  06/05/22 227 lb (103 kg)  06/03/22 226 lb 4 oz (102.6 kg)      ASSESSMENT AND PLAN:  Problem List Items Addressed This Visit       Cardiology Problems   Mixed hyperlipidemia   Relevant Medications   ezetimibe (ZETIA) 10 MG tablet   Other Visit Diagnoses     Unstable angina ([place])    -  Primary   Relevant Medications   ezetimibe (ZETIA) 10 MG  tablet   Other Relevant Orders   EKG 12-Lead   Precordial pain       Relevant Orders   NM Myocar Multi W/Spect W/Wall Motion / EF   Coronary artery disease of native artery of native heart with stable angina pectoris ([place])       Relevant Medications   ezetimibe (ZETIA) 10 MG tablet   Smoker       Essential hypertension       Relevant Medications   ezetimibe (ZETIA) 10 MG tablet      Coronary artery disease with stable angina Very heavy three-vessel coronary calcification noted on calcium scoring, images reviewed with him today Stressed the importance of smoking cessation Chronic baseline shortness of breath, unable to exclude anginal equivalent Pharmacologic Myoview ordered to rule out high risk ischemia  Hyperlipidemia Recommend he stay on Lipitor 20, we will add Zetia 10 mg daily to achieve goal LDL less than 60 We may need to increase Lipitor up to 40 to achieve goal  Smoker We have  encouraged him to continue to work on weaning his cigarettes and smoking cessation. He will continue to work on this and does not want any assistance with chantix.    Emphysema/COPD Noted on CT scan chest, smoking cessation recommended   Total encounter time more than 60 minutes  Greater than 50% was spent in counseling and coordination of care with the patient    Signed, Esmond Plants, M.D., Ph.D. [place], [place]

## 2022-08-06 ENCOUNTER — Ambulatory Visit
Admission: RE | Admit: 2022-08-06 | Discharge: 2022-08-06 | Disposition: A | Discharge status: Home / Self Care | Payer: Medicare Other | Source: Ambulatory Visit | Attending: Cardiovascular Disease | Admitting: Cardiovascular Disease

## 2022-08-06 DIAGNOSIS — R072 Precordial pain: Secondary | ICD-10-CM | POA: Diagnosis not present

## 2022-08-06 DIAGNOSIS — R0789 Other chest pain: Secondary | ICD-10-CM | POA: Diagnosis not present

## 2022-08-06 MED ORDER — TECHNETIUM TC 99M TETROFOSMIN IV KIT
10.0000 | PACK | Freq: Once | INTRAVENOUS | Status: AC | PRN
  Administered 2022-08-06: 9.4 via INTRAVENOUS

## 2022-08-06 MED ORDER — REGADENOSON 0.4 MG/5ML IV SOLN
0.4000 mg | Freq: Once | INTRAVENOUS | Status: AC
  Administered 2022-08-06: 0.4 mg via INTRAVENOUS

## 2022-08-06 MED ORDER — TECHNETIUM TC 99M TETROFOSMIN IV KIT
32.8200 | PACK | Freq: Once | INTRAVENOUS | Status: AC | PRN
  Administered 2022-08-06: 32.82 via INTRAVENOUS

## 2022-08-07 ENCOUNTER — Other Ambulatory Visit: Payer: Medicare Other

## 2022-08-07 LAB — NM MYOCAR MULTI W/SPECT W/WALL MOTION / EF
LV dias vol: 57 mL (ref 62–150)
LV sys vol: 18 mL
Nuc Stress EF: 68 %
Peak HR: 89 {beats}/min
Rest HR: 73 {beats}/min
Rest Nuclear Isotope Dose: 9.4 mCi
SDS: 5
SRS: 0
SSS: 2
ST Depression (mm): 0 mm
Stress Nuclear Isotope Dose: 32.8 mCi
TID: 1.12

## 2022-08-10 ENCOUNTER — Telehealth: Payer: Self-pay | Admitting: *Deleted

## 2022-08-10 NOTE — Telephone Encounter (Signed)
  Pt is returning call

## 2022-08-10 NOTE — Telephone Encounter (Signed)
Left voicemail message to call back for review of results.  

## 2022-08-10 NOTE — Telephone Encounter (Signed)
Patient made aware of results and verbalized understanding.  

## 2022-08-10 NOTE — Telephone Encounter (Signed)
-----  Message from Minna Merritts, MD sent at 08/08/2022  4:22 PM EST ----- Stress test No significant ischemia No EKG changes concerning for ischemia Low risk scan  Smoking cessation needed

## 2022-08-12 ENCOUNTER — Encounter: Payer: Medicare Other | Admitting: Physician Assistant

## 2022-08-12 NOTE — Progress Notes (Deleted)
.  pcpn

## 2022-08-13 ENCOUNTER — Telehealth: Payer: Self-pay

## 2022-08-13 NOTE — Telephone Encounter (Signed)
Copied from [place] [phone]. Topic: Appointment Scheduling - Scheduling Inquiry for Clinic >> Aug 12, 2022  2:20 PM [place] B wrote: Reason for CRM: patient called in to reschedule physical but has to have medicare sequential well visit first. Please call back

## 2022-08-13 NOTE — Telephone Encounter (Signed)
Copied from [place] [id number]. Topic: Appointment Scheduling - Scheduling Inquiry for Clinic >> Aug 12, 2022  2:20 PM [place] B wrote: Reason for CRM: patient called in to reschedule physical but has to have medicare sequential well visit first. Please call back

## 2022-09-03 IMAGING — CT CT CHEST LUNG CANCER SCREENING LOW DOSE W/O CM
2 of 5 series · 15 of 40 positions shown, 18 images · non-contrast
Comparison: 09/04/2019

CLINICAL DATA: 69-year-old male with 72 pack-year history of
smoking. Lung cancer screening.

EXAM:
CT CHEST WITHOUT CONTRAST LOW-DOSE FOR LUNG CANCER SCREENING
TECHNIQUE: Multidetector CT imaging of the chest was performed following the
standard protocol without IV contrast.

[Series 3: lung 1.00 · axial · 0.73mm/px · z∈[-1222,-889]mm · 12 of 369 slices shown, 15 images]
[im 18/369  mediastinal]
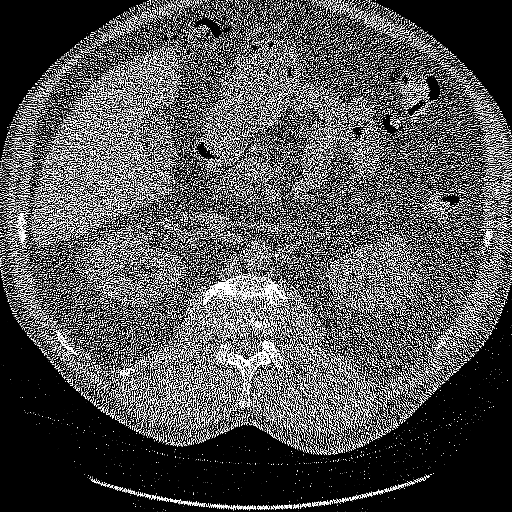
[im 18/369  lung]
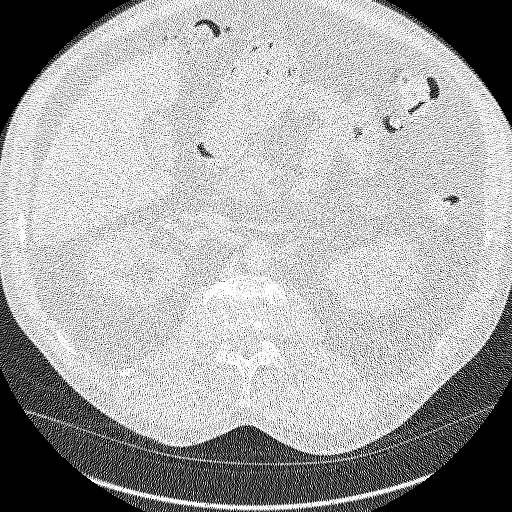
[im 53/369  lung]
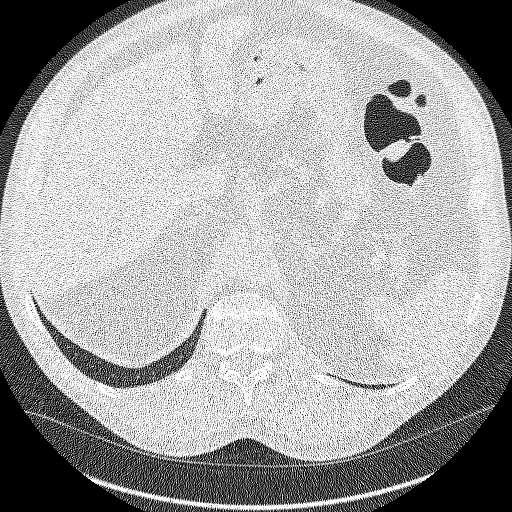
[im 88/369  lung]
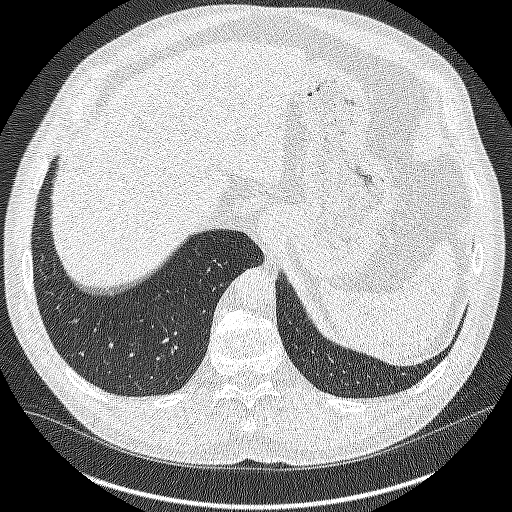
[im 106/369  lung]
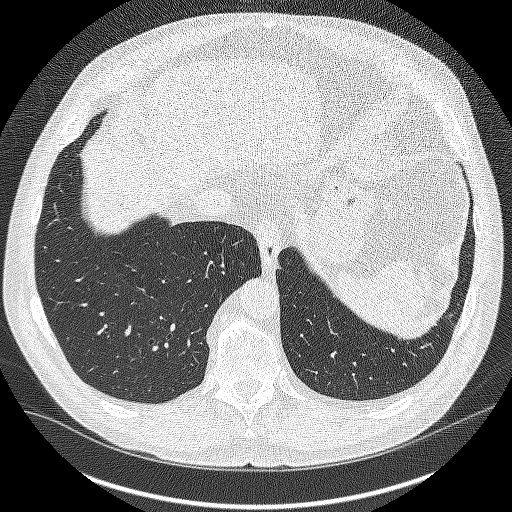
[im 141/369  mediastinal]
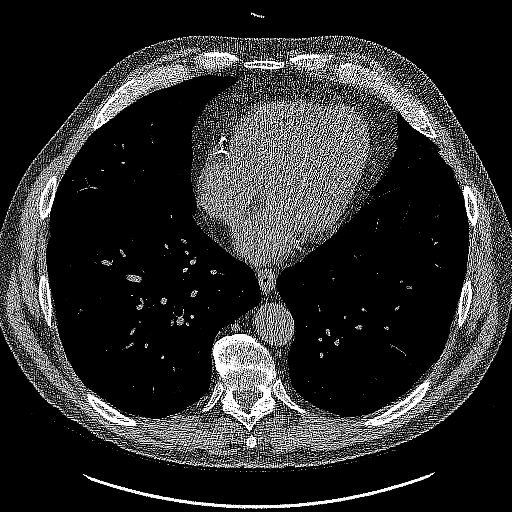
[im 141/369  lung]
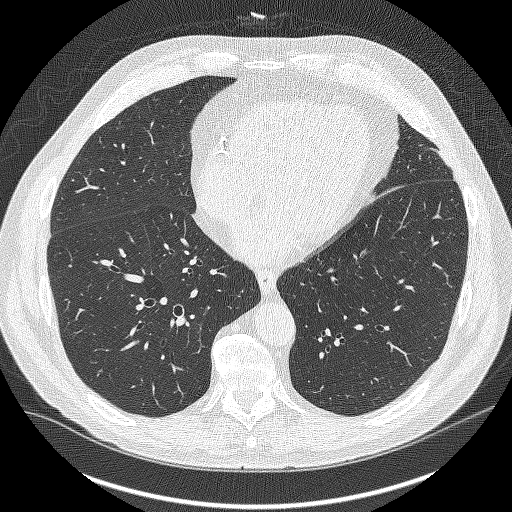
[im 176/369  lung]
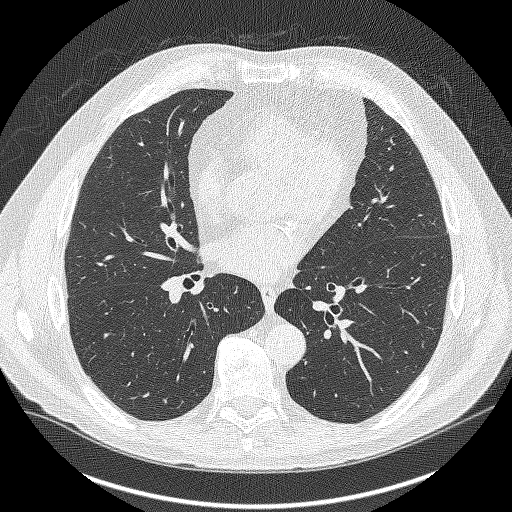
[im 193/369  lung]
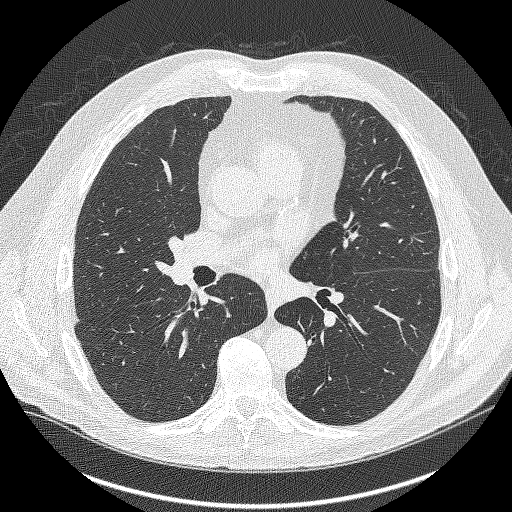
[im 228/369  lung]
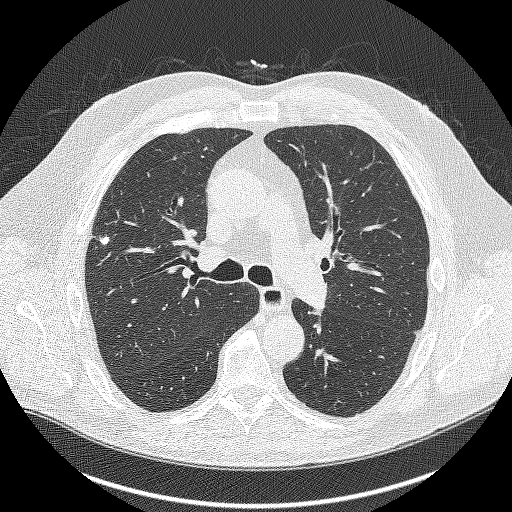
[im 263/369  mediastinal]
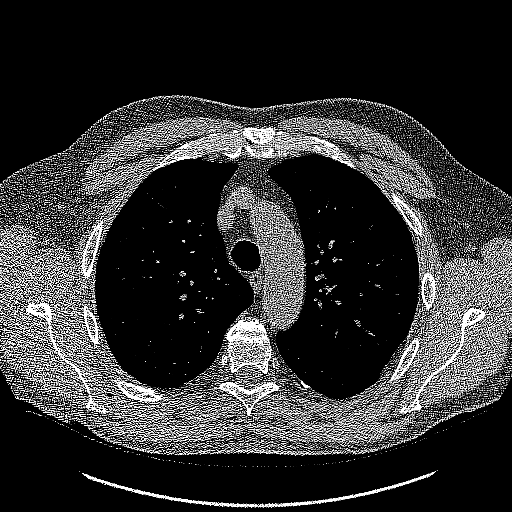
[im 263/369  lung]
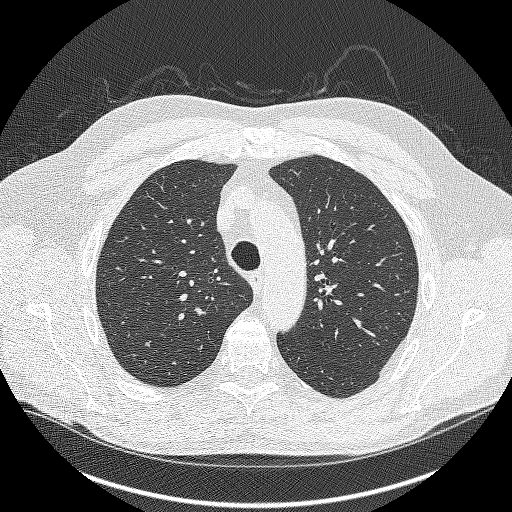
[im 281/369  lung]
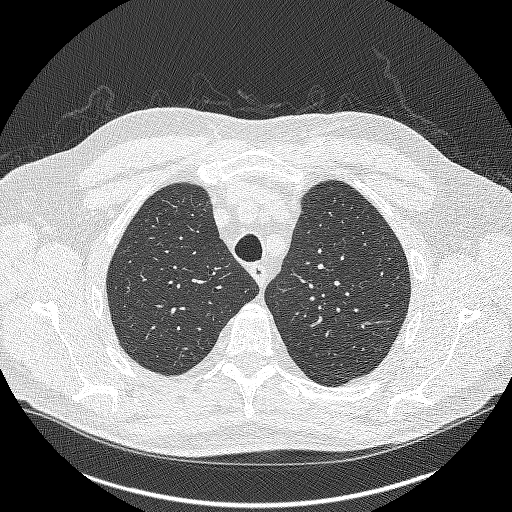
[im 316/369  lung]
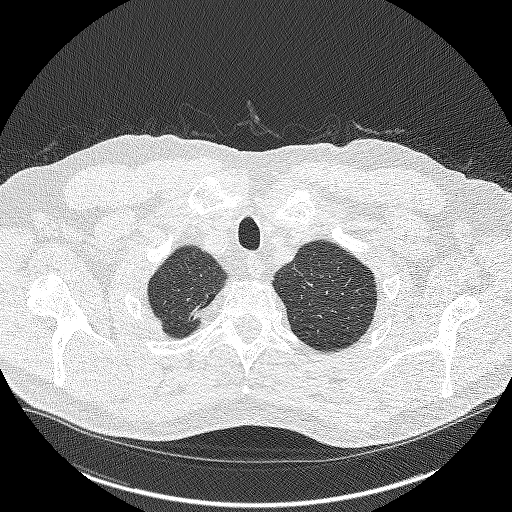
[im 351/369  lung]
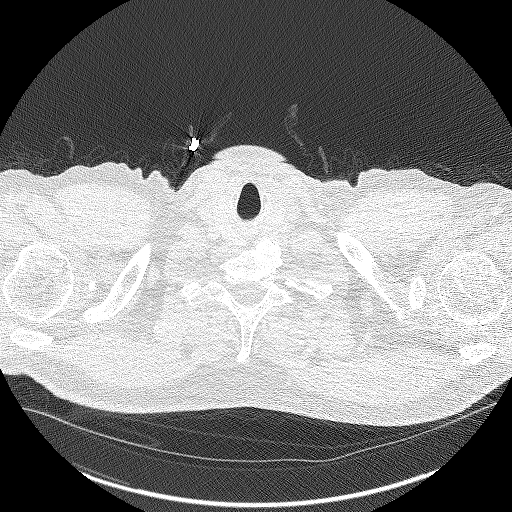

[Series 4: coronals lung 1.00 cor · coronal · 0.72mm/px · 3 of 339 slices shown]
[im 68/339  lung]
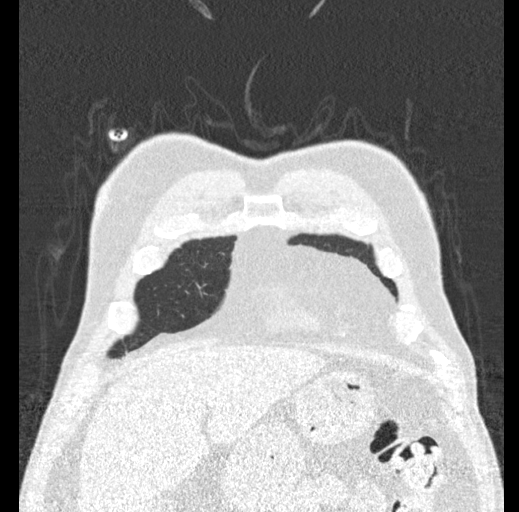
[im 136/339  lung]
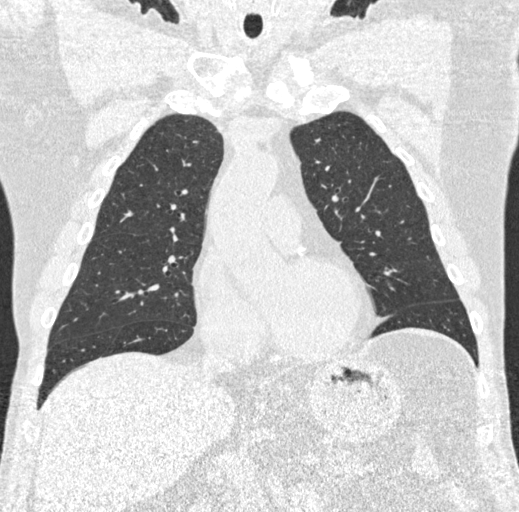
[im 203/339  lung]
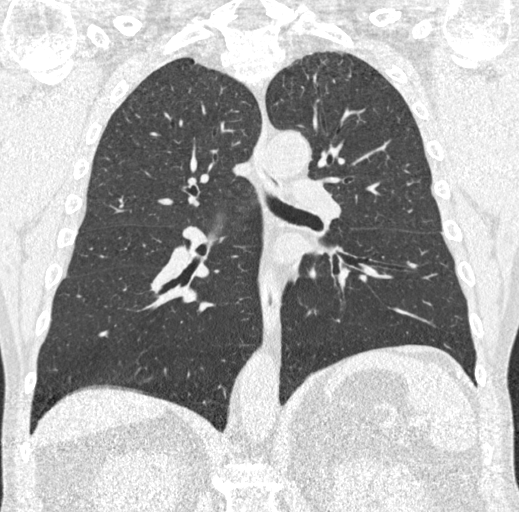

[15 of 40 positions shown; findings below may reference images not displayed]

FINDINGS: Cardiovascular: The heart size is normal. No substantial pericardial
effusion. Coronary artery calcification is evident. Atherosclerotic
calcification is noted in the wall of the thoracic aorta.

Mediastinum/Nodes: No mediastinal lymphadenopathy. Calcified nodal
tissue in the mediastinum and right hilum is compatible with
granulomatous disease. No evidence for gross hilar lymphadenopathy
although assessment is limited by the lack of intravenous contrast
on today's study. The esophagus has normal imaging features. There
is no axillary lymphadenopathy.

Lungs/Pleura: Centrilobular emphsyema noted. Previously identified
calcified and noncalcified pulmonary nodules are stable in the
interval. No new suspicious pulmonary nodule or mass. No focal
airspace consolidation. No pleural effusion.

Upper Abdomen: Stable nodularity right adrenal gland compatible with
adenoma. Otherwise unremarkable.

Musculoskeletal: No worrisome lytic or sclerotic osseous
abnormality.
IMPRESSION: 1. Lung-RADS 2, benign appearance or behavior. Continue annual
screening with low-dose chest CT without contrast in 12 months.
2. Ill-defined peribronchovascular nodularity identified previously
in the superior segment left lower lobe has resolved in the
interval.
3. Aortic Atherosclerosis (MX5WY-IDO.O) and Emphysema (MX5WY-INK.M).

## 2022-09-07 ENCOUNTER — Ambulatory Visit (INDEPENDENT_AMBULATORY_CARE_PROVIDER_SITE_OTHER): Payer: Medicare Other

## 2022-09-07 VITALS — Ht 72.0 in | Wt 227.0 lb

## 2022-09-07 DIAGNOSIS — Z0000 Encounter for general adult medical examination without abnormal findings: Secondary | ICD-10-CM | POA: Diagnosis not present

## 2022-09-07 NOTE — Patient Instructions (Signed)
Richard Bean , Thank you for taking time to come for your Medicare Wellness Visit. I appreciate your ongoing commitment to your health goals. Please review the following plan we discussed and let me know if I can assist you in the future.   These are the goals we discussed:  Goals   None     This is a list of the screening recommended for you and due dates:  Health Maintenance  Topic Date Due   DTaP/Tdap/Td vaccine (1 - Tdap) Never done   COVID-19 Vaccine (3 - Pfizer risk series) 11/08/2019   Screening for Lung Cancer  11/07/2022   Medicare Annual Wellness Visit  09/08/2023   Colon Cancer Screening  09/26/2026   Pneumonia Vaccine  Completed   Flu Shot  Completed   Hepatitis C Screening: USPSTF Recommendation to screen - Ages 18-79 yo.  Completed   Zoster (Shingles) Vaccine  Completed   HPV Vaccine  Aged Out    Advanced directives: no  Conditions/risks identified: none  Next appointment: Follow up in one year for your annual wellness visit. 09/08/2023 @ 1pm telephone  Preventive Care 65 Years and Older, Male  Preventive care refers to lifestyle choices and visits with your health care provider that can promote health and wellness. What does preventive care include? A yearly physical exam. This is also called an annual well check. Dental exams once or twice a year. Routine eye exams. Ask your health care provider how often you should have your eyes checked. Personal lifestyle choices, including: Daily care of your teeth and gums. Regular physical activity. Eating a healthy diet. Avoiding tobacco and drug use. Limiting alcohol use. Practicing safe sex. Taking low doses of aspirin every day. Taking vitamin and mineral supplements as recommended by your health care provider. What happens during an annual well check? The services and screenings done by your health care provider during your annual well check will depend on your age, overall health, lifestyle risk factors, and  family history of disease. Counseling  Your health care provider may ask you questions about your: Alcohol use. Tobacco use. Drug use. Emotional well-being. Home and relationship well-being. Sexual activity. Eating habits. History of falls. Memory and ability to understand (cognition). Work and work Statistician. Screening  You may have the following tests or measurements: Height, weight, and BMI. Blood pressure. Lipid and cholesterol levels. These may be checked every 5 years, or more frequently if you are over 59 years old. Skin check. Lung cancer screening. You may have this screening every year starting at age 68 if you have a 30-pack-year history of smoking and currently smoke or have quit within the past 15 years. Fecal occult blood test (FOBT) of the stool. You may have this test every year starting at age 78. Flexible sigmoidoscopy or colonoscopy. You may have a sigmoidoscopy every 5 years or a colonoscopy every 10 years starting at age 27. Prostate cancer screening. Recommendations will vary depending on your family history and other risks. Hepatitis C blood test. Hepatitis B blood test. Sexually transmitted disease (STD) testing. Diabetes screening. This is done by checking your blood sugar (glucose) after you have not eaten for a while (fasting). You may have this done every 1-3 years. Abdominal aortic aneurysm (AAA) screening. You may need this if you are a current or former smoker. Osteoporosis. You may be screened starting at age 29 if you are at high risk. Talk with your health care provider about your test results, treatment options, and if necessary,  the need for more tests. Vaccines  Your health care provider may recommend certain vaccines, such as: Influenza vaccine. This is recommended every year. Tetanus, diphtheria, and acellular pertussis (Tdap, Td) vaccine. You may need a Td booster every 10 years. Zoster vaccine. You may need this after age 36. Pneumococcal  13-valent conjugate (PCV13) vaccine. One dose is recommended after age 24. Pneumococcal polysaccharide (PPSV23) vaccine. One dose is recommended after age 29. Talk to your health care provider about which screenings and vaccines you need and how often you need them. This information is not intended to replace advice given to you by your health care provider. Make sure you discuss any questions you have with your health care provider. Document Released: 08/09/2015 Document Revised: 04/01/2016 Document Reviewed: 05/14/2015 Elsevier Interactive Patient Education  2017 [place] Prevention in the Home Falls can cause injuries. They can happen to people of all ages. There are many things you can do to make your home safe and to help prevent falls. What can I do on the outside of my home? Regularly fix the edges of walkways and driveways and fix any cracks. Remove anything that might make you trip as you walk through a door, such as a raised step or threshold. Trim any bushes or trees on the path to your home. Use bright outdoor lighting. Clear any walking paths of anything that might make someone trip, such as rocks or tools. Regularly check to see if handrails are loose or broken. Make sure that both sides of any steps have handrails. Any raised decks and porches should have guardrails on the edges. Have any leaves, snow, or ice cleared regularly. Use sand or salt on walking paths during winter. Clean up any spills in your garage right away. This includes oil or grease spills. What can I do in the bathroom? Use night lights. Install grab bars by the toilet and in the tub and shower. Do not use towel bars as grab bars. Use non-skid mats or decals in the tub or shower. If you need to sit down in the shower, use a plastic, non-slip stool. Keep the floor dry. Clean up any water that spills on the floor as soon as it happens. Remove soap buildup in the tub or shower regularly. Attach  bath mats securely with double-sided non-slip rug tape. Do not have throw rugs and other things on the floor that can make you trip. What can I do in the bedroom? Use night lights. Make sure that you have a light by your bed that is easy to reach. Do not use any sheets or blankets that are too big for your bed. They should not hang down onto the floor. Have a firm chair that has side arms. You can use this for support while you get dressed. Do not have throw rugs and other things on the floor that can make you trip. What can I do in the kitchen? Clean up any spills right away. Avoid walking on wet floors. Keep items that you use a lot in easy-to-reach places. If you need to reach something above you, use a strong step stool that has a grab bar. Keep electrical cords out of the way. Do not use floor polish or wax that makes floors slippery. If you must use wax, use non-skid floor wax. Do not have throw rugs and other things on the floor that can make you trip. What can I do with my stairs? Do not leave any items on  the stairs. Make sure that there are handrails on both sides of the stairs and use them. Fix handrails that are broken or loose. Make sure that handrails are as long as the stairways. Check any carpeting to make sure that it is firmly attached to the stairs. Fix any carpet that is loose or worn. Avoid having throw rugs at the top or bottom of the stairs. If you do have throw rugs, attach them to the floor with carpet tape. Make sure that you have a light switch at the top of the stairs and the bottom of the stairs. If you do not have them, ask someone to add them for you. What else can I do to help prevent falls? Wear shoes that: Do not have high heels. Have rubber bottoms. Are comfortable and fit you well. Are closed at the toe. Do not wear sandals. If you use a stepladder: Make sure that it is fully opened. Do not climb a closed stepladder. Make sure that both sides of the  stepladder are locked into place. Ask someone to hold it for you, if possible. Clearly mark and make sure that you can see: Any grab bars or handrails. First and last steps. Where the edge of each step is. Use tools that help you move around (mobility aids) if they are needed. These include: Canes. Walkers. Scooters. Crutches. Turn on the lights when you go into a dark area. Replace any light bulbs as soon as they burn out. Set up your furniture so you have a clear path. Avoid moving your furniture around. If any of your floors are uneven, fix them. If there are any pets around you, be aware of where they are. Review your medicines with your doctor. Some medicines can make you feel dizzy. This can increase your chance of falling. Ask your doctor what other things that you can do to help prevent falls. This information is not intended to replace advice given to you by your health care provider. Make sure you discuss any questions you have with your health care provider. Document Released: 05/09/2009 Document Revised: 12/19/2015 Document Reviewed: 08/17/2014 Elsevier Interactive Patient Education  2017 [employer].

## 2022-09-07 NOTE — Progress Notes (Signed)
I connected with  [name] on 09/07/22 by a audio enabled telemedicine application and verified that I am speaking with the correct person using two identifiers.  Patient Location: Home  Provider Location: Home Office  I discussed the limitations of evaluation and management by telemedicine. The patient expressed understanding and agreed to proceed.  Subjective:   ECTOR KLEPACKI is a 72 y.o. male who presents for Medicare Annual/Subsequent preventive examination.  Review of Systems     Cardiac Risk Factors include: advanced age (>38mn, >>1women);smoking/ tobacco exposure;sedentary lifestyle;dyslipidemia;male gender     Objective:    Today's Vitals   09/07/22 1058  Weight: 227 lb (103 kg)  Height: 6' (1.829 m)   Body mass index is 30.79 kg/m.     09/07/2022   11:08 AM 09/25/2021    9:32 AM 09/24/2021    9:09 AM 10/02/2020    7:18 AM 06/26/2020    8:06 AM 08/08/2018    3:01 PM 10/28/2016    1:58 PM  Advanced Directives  Does Patient Have a Medical Advance Directive? No No No No No    Would patient like information on creating a medical advance directive? Yes (MAU/Ambulatory/Procedural Areas - Information given)   No - Patient declined        Information is confidential and restricted. Go to Review Flowsheets to unlock data.    Current Medications (verified) Outpatient Encounter Medications as of 09/07/2022  Medication Sig   amLODipine (NORVASC) 5 MG tablet Take 5 mg by mouth daily.   atorvastatin (LIPITOR) 20 MG tablet TAKE 1 TABLET DAILY   enalapril (VASOTEC) 5 MG tablet Take 5 mg by mouth daily.   eszopiclone (LUNESTA) 1 MG TABS tablet TAKE 1 TABLET AT BEDTIME AS NEEDED FOR SLEEP. TAKE IMMEDIATELY BEFORE BEDTIME   ezetimibe (ZETIA) 10 MG tablet Take 1 tablet (10 mg total) by mouth daily.   imipramine (TOFRANIL) 25 MG tablet TAKE 1 TABLET AT BEDTIME   omeprazole (PRILOSEC) 40 MG capsule TAKE 1 CAPSULE TWICE A DAY BEFORE MEALS   propranolol (INDERAL) 10 MG tablet Take  10 mg by mouth daily as needed.   sildenafil (REVATIO) 20 MG tablet Take 1 tablet (20 mg total) by mouth as directed.   escitalopram (LEXAPRO) 20 MG tablet Take 1 tablet (20 mg total) by mouth daily.   No facility-administered encounter medications on file as of 09/07/2022.    Allergies (verified) Chlorpheniramine-phenylephrine   History: Past Medical History:  Diagnosis Date   Actinic keratosis    Anxiety    Cancer ([place]    Depression    GERD (gastroesophageal reflux disease)    H/O prostate cancer    Heart murmur    Kidney disease    Liver disease    Panic disorder with agoraphobia    Past Surgical History:  Procedure Laterality Date   ANKLE SURGERY Right    COLONOSCOPY WITH PROPOFOL N/A 09/25/2021   Procedure: COLONOSCOPY WITH PROPOFOL;  Surgeon: VLin Landsman MD;  Location: [facility] ENDOSCOPY;  Service: Gastroenterology;  Laterality: N/A;   ESOPHAGOGASTRODUODENOSCOPY (EGD) WITH PROPOFOL N/A 06/26/2020   Procedure: ESOPHAGOGASTRODUODENOSCOPY (EGD) WITH PROPOFOL;  Surgeon: VLin Landsman MD;  [facility];  Service: Gastroenterology;  Laterality: N/A;   ESOPHAGOGASTRODUODENOSCOPY (EGD) WITH PROPOFOL N/A 10/02/2020   Procedure: ESOPHAGOGASTRODUODENOSCOPY (EGD) WITH PROPOFOL;  Surgeon: VLin Landsman MD;  [facility];  Service: Gastroenterology;  Laterality: N/A;   PROSTATE SURGERY     Family History  Problem Relation Age of Onset  COPD Mother    Prostate cancer Father    Cancer - Lung Father    Depression Sister    Stomach cancer Sister    Alcohol abuse Brother    Heart block Brother    Breast cancer Sister    Prostate cancer Brother    Skin cancer Brother    Social History   Socioeconomic History   Marital status: Married    Spouse name: Not on file   Number of children: Not on file   Years of education: Not on file   Highest education level: Not on file  Occupational History   Not on file  Tobacco Use   Smoking status:  Every Day    Packs/day: 1.00    Years: 47.00    Total pack years: 47.00    Types: Cigarettes    Start date: 05/06/1971   Smokeless tobacco: Never  Vaping Use   Vaping Use: Never used  Substance and Sexual Activity   Alcohol use: Yes    Alcohol/week: 16.0 standard drinks of alcohol    Types: 6 Cans of beer, 10 Shots of liquor per week    Comment: in 1 to 2 times a week    Drug use: No   Sexual activity: Yes  Other Topics Concern   Not on file  Social History Narrative   Not on file   Social Determinants of Health   Financial Resource Strain: Low Risk  (09/07/2022)   Overall Financial Resource Strain (CARDIA)    Difficulty of Paying Living Expenses: Not hard at all  Food Insecurity: No Food Insecurity (09/07/2022)   Hunger Vital Sign    Worried About Running Out of Food in the Last Year: Never true    Ran Out of Food in the Last Year: Never true  Transportation Needs: No Transportation Needs (09/07/2022)   PRAPARE - Hydrologist (Medical): No    Lack of Transportation (Non-Medical): No  Physical Activity: Inactive (09/07/2022)   Exercise Vital Sign    Days of Exercise per Week: 0 days    Minutes of Exercise per Session: 0 min  Stress: No Stress Concern Present (09/07/2022)   [place]    Feeling of Stress : Not at all  Social Connections: Socially Isolated (09/07/2022)   Social Connection and Isolation Panel [NHANES]    Frequency of Communication with Friends and Family: Three times a week    Frequency of Social Gatherings with Friends and Family: Once a week    Attends Religious Services: Never    Marine scientist or Organizations: No    Attends Music therapist: Never    Marital Status: Separated    Tobacco Counseling Ready to quit: Not Answered Counseling given: Not Answered   Clinical Intake:  Pre-visit preparation completed: Yes  Pain : No/denies  pain     BMI - recorded: 30.79 Nutritional Status: BMI > 30  Obese Nutritional Risks: None Diabetes: No  How often do you need to have someone help you when you read instructions, pamphlets, or other written materials from your doctor or pharmacy?: 1 - Never  Diabetic?no  Interpreter Needed?: No  Information entered by :: B.Navayah Sok,LPN   Activities of Daily Living    09/07/2022   11:12 AM 05/20/2022    2:34 PM  In your present state of health, do you have any difficulty performing the following activities:  Hearing? 0 0  Vision?  0 0  Difficulty concentrating or making decisions? 0 0  Walking or climbing stairs? 0 0  Dressing or bathing? 0 0  Doing errands, shopping? 0 0  Preparing Food and eating ? N   Using the Toilet? N   In the past six months, have you accidently leaked urine? N   Do you have problems with loss of bowel control? N   Managing your Medications? N   Managing your Finances? N   Housekeeping or managing your Housekeeping? N     Patient Care Team: Mikey Kirschner, PA-C as PCP - General (Physician Assistant) Minna Merritts, MD as Consulting Physician (Cardiology)  Indicate any recent Medical Services you may have received from other than [facility] providers in the past year (date may be approximate).     Assessment:   This is a routine wellness examination for Alexan.  Hearing/Vision screen Hearing Screening - Comments:: Adequate vision Vision Screening - Comments:: Wears glasses:adequate vision  Dietary issues and exercise activities discussed: Exercise limited by: None identified   Goals Addressed   None    Depression Screen    09/07/2022   11:05 AM 06/08/2022    1:58 PM 05/20/2022    2:34 PM 01/28/2022    1:19 PM 01/01/2022    1:51 PM 08/27/2021    2:43 PM 05/20/2021    9:16 AM  PHQ 2/9 Scores  PHQ - 2 Score 0  1 1  0   PHQ- 9 Score   5 2  2      $ Information is confidential and restricted. Go to Review Flowsheets to unlock data.     Fall Risk    09/07/2022   11:02 AM 05/20/2022    2:34 PM 05/20/2022    2:18 PM 01/28/2022    1:19 PM 08/27/2021    2:43 PM  [place] in the past year? 0 0 1 0 0  Number falls in past yr: 0 0 0 0 0  Injury with Fall? 0 0 0 0 0  Risk for fall due to : No Fall Risks No Fall Risks  No Fall Risks No Fall Risks  Follow up Follow up appointment;Falls prevention discussed Falls evaluation completed Falls evaluation completed      FALL RISK PREVENTION PERTAINING TO THE HOME:  Any stairs in or around the home? no If so, are there any without handrails? No  Home free of loose throw rugs in walkways, pet beds, electrical cords, etc? Yes  Adequate lighting in your home to reduce risk of falls? Yes   ASSISTIVE DEVICES UTILIZED TO PREVENT FALLS:  Life alert? No  Use of a cane, walker or w/c? No  Grab bars in the bathroom? No  Shower chair or bench in shower? No  Elevated toilet seat or a handicapped toilet? No    Cognitive Function:        09/07/2022   11:13 AM 04/04/2020    2:49 PM  6CIT Screen  What Year? 0 points 0 points  What month? 0 points 0 points  What time? 0 points 0 points  Count back from 20 0 points 0 points  Months in reverse 0 points 0 points  Repeat phrase 0 points 2 points  Total Score 0 points 2 points    Immunizations Immunization History  Administered Date(s) Administered   Fluad Quad(high Dose 65+) 04/04/2020, 08/27/2021, 05/20/2022   Influenza, High Dose Seasonal PF 06/04/2016, 05/05/2018   Influenza,inj,Quad PF,6+ Mos 06/17/2015   PFIZER(Purple Top)SARS-COV-2  Vaccination 09/20/2019, 10/11/2019   Pneumococcal Conjugate-13 10/12/2016   Pneumococcal Polysaccharide-23 03/09/2019   Zoster Recombinat (Shingrix) 09/03/2021, 01/21/2022   Zoster, Live 05/24/2014    TDAP status: Due, Education has been provided regarding the importance of this vaccine. Advised may receive this vaccine at local pharmacy or Health Dept. Aware to provide a copy of the  vaccination record if obtained from local pharmacy or Health Dept. Verbalized acceptance and understanding.  Flu Vaccine status: Up to date  Pneumococcal vaccine status: Up to date  Covid-19 vaccine status: Completed vaccines  Qualifies for Shingles Vaccine? Yes   Zostavax completed No   Shingrix Completed?: No.    Education has been provided regarding the importance of this vaccine. Patient has been advised to call insurance company to determine out of pocket expense if they have not yet received this vaccine. Advised may also receive vaccine at local pharmacy or Health Dept. Verbalized acceptance and understanding.  Screening Tests Health Maintenance  Topic Date Due   DTaP/Tdap/Td (1 - Tdap) Never done   COVID-19 Vaccine (3 - Pfizer risk series) 11/08/2019   Lung Cancer Screening  11/07/2022   Medicare Annual Wellness (AWV)  09/08/2023   COLONOSCOPY (Pts 45-6yr Insurance coverage will need to be confirmed)  09/26/2026   Pneumonia Vaccine 72 Years old  Completed   INFLUENZA VACCINE  Completed   Hepatitis C Screening  Completed   Zoster Vaccines- Shingrix  Completed   HPV VACCINES  Aged Out    Health Maintenance  Health Maintenance Due  Topic Date Due   DTaP/Tdap/Td (1 - Tdap) Never done   COVID-19 Vaccine (3 - Pfizer risk series) 11/08/2019    Colorectal cancer screening: Type of screening: Colonoscopy. Completed yes. Repeat every 5 years  Lung Cancer Screening: (Low Dose CT Chest recommended if Age 73-80years, 30 pack-year currently smoking OR have quit w/in 15years.) does qualify.   Lung Cancer Screening Referral: no  Additional Screening:  Hepatitis C Screening: does not qualify; Completed no  Vision Screening: Recommended annual ophthalmology exams for early detection of glaucoma and other disorders of the eye. Is the patient up to date with their annual eye exam?  Yes  Who is the provider or what is the name of the office in which the patient attends annual  eye exams? Pt cannot remember name If pt is not established with a provider, would they like to be referred to a provider to establish care? No .   Dental Screening: Recommended annual dental exams for proper oral hygiene  Community Resource Referral / Chronic Care Management: CRR required this visit?  No   CCM required this visit?  No      Plan:     I have personally reviewed and noted the following in the patient's chart:   Medical and social history Use of alcohol, tobacco or illicit drugs  Current medications and supplements including opioid prescriptions. Patient is not currently taking opioid prescriptions. Functional ability and status Nutritional status Physical activity Advanced directives List of other physicians Hospitalizations, surgeries, and ER visits in previous 12 months Vitals Screenings to include cognitive, depression, and falls Referrals and appointments  In addition, I have reviewed and discussed with patient certain preventive protocols, quality metrics, and best practice recommendations. A written personalized care plan for preventive services as well as general preventive health recommendations were provided to patient.     [facility] LPN   [id number]  Nurse Notes: pt has no concerns and/or questions, says he  is doing well.

## 2022-09-10 ENCOUNTER — Encounter: Payer: Self-pay | Admitting: Physician Assistant

## 2022-09-10 ENCOUNTER — Ambulatory Visit (INDEPENDENT_AMBULATORY_CARE_PROVIDER_SITE_OTHER): Payer: Medicare Other | Admitting: Physician Assistant

## 2022-09-10 VITALS — BP 136/76 | HR 105 | Temp 98.6°F | Ht 72.0 in | Wt 229.0 lb

## 2022-09-10 DIAGNOSIS — Z720 Tobacco use: Secondary | ICD-10-CM

## 2022-09-10 DIAGNOSIS — E782 Mixed hyperlipidemia: Secondary | ICD-10-CM

## 2022-09-10 DIAGNOSIS — I10 Essential (primary) hypertension: Secondary | ICD-10-CM

## 2022-09-10 DIAGNOSIS — H6192 Disorder of left external ear, unspecified: Secondary | ICD-10-CM

## 2022-09-10 DIAGNOSIS — K2270 Barrett's esophagus without dysplasia: Secondary | ICD-10-CM | POA: Diagnosis not present

## 2022-09-10 DIAGNOSIS — F3342 Major depressive disorder, recurrent, in full remission: Secondary | ICD-10-CM | POA: Diagnosis not present

## 2022-09-10 DIAGNOSIS — J438 Other emphysema: Secondary | ICD-10-CM | POA: Diagnosis not present

## 2022-09-10 DIAGNOSIS — K7469 Other cirrhosis of liver: Secondary | ICD-10-CM | POA: Diagnosis not present

## 2022-09-10 MED ORDER — ESCITALOPRAM OXALATE 20 MG PO TABS: 20.0000 mg | ORAL_TABLET | Freq: Every day | ORAL | 1 refills | Status: DC

## 2022-09-10 NOTE — Assessment & Plan Note (Addendum)
Stable, well controlled with lexapro  Some elements of panic disease, has propranolol 10 prn  Pt reports his psychiatrist would like him to follow with pcp as he is so well controlled This is fine

## 2022-09-10 NOTE — Assessment & Plan Note (Signed)
Evidenced on lung ct  Asymptomatic currently

## 2022-09-10 NOTE — Assessment & Plan Note (Signed)
Follows with GI On pantoprazole Last endo on record 2022 , has upcoming gi appt

## 2022-09-10 NOTE — Assessment & Plan Note (Signed)
Managed on atorvastatin 20 and zetia 10 mg  Will repeat fasting lipids and increase statin as indicated Pt had consult with cardio regarding high ct calcium score  Echo was normal

## 2022-09-10 NOTE — Assessment & Plan Note (Signed)
2/2 NASH but pt does drink etoh Asymptomatic will continue to monitor lfts

## 2022-09-10 NOTE — Assessment & Plan Note (Addendum)
No interest in smoking cessation Pt is aware of consequences of tobacco use Last LDCT 11/06/21.

## 2022-09-10 NOTE — Assessment & Plan Note (Signed)
Well controlled Reviewed last cmp Pt follows with nephro  Manages with amlodipine 5 mg enalapril 5 mg F/u 6 mo

## 2022-09-10 NOTE — Progress Notes (Signed)
Established patient visit   Patient: Richard Bean   DOB: 18-May-1951   72 y.o. Male  MRN: [id number] Visit Date: 09/10/2022  Today's healthcare provider: Mikey Kirschner, PA-C   Cc. Htn, hld, ear lesion  Subjective    HPI  Pt reports a new lesion of the top of his left ear that is painful x 6 months. Reports it gets bigger and smaller but does not go away entirely.   Hypertension, follow-up  BP Readings from Last 3 Encounters:  09/10/22 136/76  07/28/22 120/60  06/05/22 (!) 160/84   Wt Readings from Last 3 Encounters:  09/10/22 229 lb (103.9 kg)  09/07/22 227 lb (103 kg)  07/28/22 227 lb (103 kg)     He was last seen for hypertension 4 months ago.  BP at that visit was 138/73. Management since that visit includes continue current treatment. Lipid/Cholesterol, Follow-up  Last lipid panel Other pertinent labs  Lab Results  Component Value Date   CHOL 176 11/13/2021   HDL 70 11/13/2021   LDLCALC 84 11/13/2021   TRIG 130 11/13/2021   CHOLHDL 2.5 11/13/2021   Lab Results  Component Value Date   ALT 18 11/13/2021   AST 25 11/13/2021   PLT 287 09/16/2021   TSH 1.710 09/16/2021     He was last seen for this 4 months ago.  Management since that visit includes continue current treatment.  Denies sob chest pain dizziness, new cough, leg swelling.  Medications: Outpatient Medications Prior to Visit  Medication Sig   amLODipine (NORVASC) 5 MG tablet Take 5 mg by mouth daily.   atorvastatin (LIPITOR) 20 MG tablet TAKE 1 TABLET DAILY   enalapril (VASOTEC) 5 MG tablet Take 5 mg by mouth daily.   eszopiclone (LUNESTA) 1 MG TABS tablet TAKE 1 TABLET AT BEDTIME AS NEEDED FOR SLEEP. TAKE IMMEDIATELY BEFORE BEDTIME   ezetimibe (ZETIA) 10 MG tablet Take 1 tablet (10 mg total) by mouth daily.   imipramine (TOFRANIL) 25 MG tablet TAKE 1 TABLET AT BEDTIME   omeprazole (PRILOSEC) 40 MG capsule TAKE 1 CAPSULE TWICE A DAY BEFORE MEALS   propranolol (INDERAL) 10 MG tablet  Take 10 mg by mouth daily as needed.   sildenafil (REVATIO) 20 MG tablet Take 1 tablet (20 mg total) by mouth as directed.   [DISCONTINUED] escitalopram (LEXAPRO) 20 MG tablet Take 1 tablet (20 mg total) by mouth daily.   No facility-administered medications prior to visit.    Review of Systems  Constitutional:  Negative for fatigue and fever.  Respiratory:  Negative for cough and shortness of breath.   Cardiovascular:  Negative for chest pain, palpitations and leg swelling.  Neurological:  Negative for dizziness and headaches.       Objective    BP 136/76 (BP Location: Right Arm, Patient Position: Sitting, Cuff Size: Large)   Pulse (!) 105   Temp 98.6 F (37 C)   Ht 6' (1.829 m)   Wt 229 lb (103.9 kg)   SpO2 98%   BMI 31.06 kg/m   Physical Exam Constitutional:      General: He is awake.     Appearance: He is well-developed.  HENT:     Head: Normocephalic.     Ears:     Comments: Left ear with a scaled lesion < 1 cm Eyes:     Conjunctiva/sclera: Conjunctivae normal.  Cardiovascular:     Rate and Rhythm: Normal rate and regular rhythm.     Heart  sounds: Normal heart sounds.  Pulmonary:     Effort: Pulmonary effort is normal.     Breath sounds: Normal breath sounds.  Musculoskeletal:     Right lower leg: No edema.     Left lower leg: No edema.  Skin:    General: Skin is warm.  Neurological:     Mental Status: He is alert and oriented to person, place, and time.  Psychiatric:        Attention and Perception: Attention normal.        Mood and Affect: Mood normal.        Speech: Speech normal.        Behavior: Behavior is cooperative.      No results found for any visits on 09/10/22.  Assessment & Plan     Problem List Items Addressed This Visit       Cardiovascular and Mediastinum   Hypertension - Primary    Well controlled Reviewed last cmp Pt follows with nephro  Manages with amlodipine 5 mg enalapril 5 mg F/u 6 mo        Respiratory    Paraseptal emphysema (HCC)    Evidenced on lung ct  Asymptomatic currently         Digestive   Other cirrhosis of liver (HCC)    2/2 NASH but pt does drink etoh Asymptomatic will continue to monitor lfts       Barrett's esophagus without dysplasia    Follows with GI On pantoprazole Last endo on record 2022 , has upcoming gi appt        Other   Recurrent major depression in full remission ([place])    Stable, well controlled with lexapro  Some elements of panic disease, has propranolol 10 prn  Pt reports his psychiatrist would like him to follow with pcp as he is so well controlled This is fine      Relevant Medications   escitalopram (LEXAPRO) 20 MG tablet   Tobacco use    No interest in smoking cessation Pt is aware of consequences of tobacco use Last LDCT 11/06/21.       Mixed hyperlipidemia    Managed on atorvastatin 20 and zetia 10 mg  Will repeat fasting lipids and increase statin as indicated Pt had consult with cardio regarding high ct calcium score  Echo was normal      Relevant Orders   Comprehensive Metabolic Panel (CMET)   Lipid Profile   Other Visit Diagnoses     Skin lesion of left ear          Skin lesion left ear -suspicion of SCC advised pt make a f/u with his dermatologist.  Return in about 6 months (around 03/11/2023) for hypertension, depression.      I, Mikey Kirschner, PA-C have reviewed all documentation for this visit. The documentation on  09/10/22  for the exam, diagnosis, procedures, and orders are all accurate and complete.  Mikey Kirschner, PA-C [facility] [address] [place], [place], [address] Office: [phone] Fax: [place]

## 2022-09-14 DIAGNOSIS — N1831 Chronic kidney disease, stage 3a: Secondary | ICD-10-CM | POA: Diagnosis not present

## 2022-09-14 DIAGNOSIS — I129 Hypertensive chronic kidney disease with stage 1 through stage 4 chronic kidney disease, or unspecified chronic kidney disease: Secondary | ICD-10-CM | POA: Diagnosis not present

## 2022-09-16 DIAGNOSIS — I129 Hypertensive chronic kidney disease with stage 1 through stage 4 chronic kidney disease, or unspecified chronic kidney disease: Secondary | ICD-10-CM | POA: Diagnosis not present

## 2022-09-16 DIAGNOSIS — N1831 Chronic kidney disease, stage 3a: Secondary | ICD-10-CM | POA: Diagnosis not present

## 2022-11-05 ENCOUNTER — Other Ambulatory Visit: Payer: Self-pay | Admitting: *Deleted

## 2022-11-05 MED ORDER — EZETIMIBE 10 MG PO TABS: 10.0000 mg | ORAL_TABLET | Freq: Every day | ORAL | 3 refills | Status: DC

## 2022-11-09 ENCOUNTER — Ambulatory Visit
Admission: RE | Admit: 2022-11-09 | Discharge: 2022-11-09 | Disposition: A | Discharge status: Home / Self Care | Payer: Medicare Other | Source: Ambulatory Visit | Attending: Physician Assistant | Admitting: Physician Assistant

## 2022-11-09 DIAGNOSIS — Z122 Encounter for screening for malignant neoplasm of respiratory organs: Secondary | ICD-10-CM | POA: Diagnosis not present

## 2022-11-09 DIAGNOSIS — Z87891 Personal history of nicotine dependence: Secondary | ICD-10-CM | POA: Insufficient documentation

## 2022-11-09 DIAGNOSIS — F17210 Nicotine dependence, cigarettes, uncomplicated: Secondary | ICD-10-CM | POA: Insufficient documentation

## 2022-11-10 ENCOUNTER — Telehealth: Payer: Self-pay | Admitting: Physician Assistant

## 2022-11-10 NOTE — Telephone Encounter (Signed)
LVMTCB. CRM created. Ok for PEC to advise 

## 2022-11-10 NOTE — Telephone Encounter (Signed)
Please advise pt his lung cancer screening:  - was stable, and anything new was normal appearing   - showed some heart artery blockages, but we knew this already from his prior scans.. follow up with cardiology as they have recommended. Nothing new

## 2022-11-12 ENCOUNTER — Other Ambulatory Visit: Payer: Self-pay | Admitting: Acute Care

## 2022-11-12 DIAGNOSIS — Z122 Encounter for screening for malignant neoplasm of respiratory organs: Secondary | ICD-10-CM

## 2022-11-12 DIAGNOSIS — Z87891 Personal history of nicotine dependence: Secondary | ICD-10-CM

## 2022-11-12 DIAGNOSIS — F17210 Nicotine dependence, cigarettes, uncomplicated: Secondary | ICD-10-CM

## 2022-11-16 ENCOUNTER — Ambulatory Visit: Payer: Self-pay | Admitting: *Deleted

## 2022-11-16 NOTE — Telephone Encounter (Signed)
  Chief Complaint: Call in for lung scan result.   Read message to him from Alfredia Ferguson, PA-C  No questions Symptoms: N/A Frequency: N/A Pertinent Negatives: Patient denies N/A Disposition: ED /[] Urgent Care (no appt availability in office) / Appointment(In office/virtual)/  [facility]/ Home Care/ Refused Recommended Disposition /[] [place] [facility]/  Follow-up with PCP Additional Notes: No questions from pt.

## 2022-11-16 NOTE — Telephone Encounter (Signed)
Pt returned call and was given the message regarding his lung scan dated 11/10/2022 at 8:46 AM from Alfredia Ferguson, PA-C No questions. Reason for Disposition  [1] Follow-up call to recent contact AND [2] information only call, no triage required  Answer Assessment - Initial Assessment Questions 1. REASON FOR CALL or QUESTION: "What is your reason for calling today?" or "How can I best help you?" or "What question do you have that I can help answer?"     Pt returned call and was given his lung scan result from Alfredia Ferguson, PA-C dated 11/10/2022 at 8:46 AM.  No questions from pt.  Protocols used: Information Only Call - No Triage-A-AH

## 2022-12-05 NOTE — Progress Notes (Signed)
BH MD/PA/NP OP Progress Note  12/07/2022 11:31 AM Richard Bean  MRN:  [id number]  Chief Complaint:  Chief Complaint  Patient presents with   Follow-up   HPI:  This is a follow-up appointment for panic disorder.  He states that he has been doing very well.  He continues to enjoy doing pool, and the yard work.  He feels good about weight loss since working on his diet for his liver.  He tries not to eat beef or pork.  There was a few times he felt he would have a panic attack when he drove.  He was able to work through it without having panic attacks.  He sleeps well.  Although he feels un refreshed in the morning and does snore, he is not interested in sleep evaluation at this time. He denies feeling depressed.  He denies SI.  He feels comfortable to stay on the medication as at this.   BP 136/76  08/2022   Substance use  Tobacco Alcohol Other substances/  Current 1 PPD Liquor 4-5, 2-3 times per week, denies craving for alcohol denies  Past 2-3 PPD Same amount as above denies  Past Treatment        Wt Readings from Last 3 Encounters:  12/07/22 224 lb 6.4 oz (101.8 kg)  11/09/22 228 lb (103.4 kg)  09/10/22 229 lb (103.9 kg)     Visit Diagnosis:    ICD-10-CM   1. Panic disorder  F41.0       Past Psychiatric History: Please see initial evaluation for full details. I have reviewed the history. No updates at this time.     Past Medical History:  Past Medical History:  Diagnosis Date   Actinic keratosis    Anxiety    Cancer (HCC)    Depression    GERD (gastroesophageal reflux disease)    H/O prostate cancer    Heart murmur    Kidney disease    Liver disease    Panic disorder with agoraphobia     Past Surgical History:  Procedure Laterality Date   ANKLE SURGERY Right    COLONOSCOPY WITH PROPOFOL N/A 09/25/2021   Procedure: COLONOSCOPY WITH PROPOFOL;  Surgeon: Toney Reil, MD;  Location: [facility] ENDOSCOPY;  Service: Gastroenterology;  Laterality: N/A;    ESOPHAGOGASTRODUODENOSCOPY (EGD) WITH PROPOFOL N/A 06/26/2020   Procedure: ESOPHAGOGASTRODUODENOSCOPY (EGD) WITH PROPOFOL;  Surgeon: Toney Reil, MD;  Location: [facility] ENDOSCOPY;  Service: Gastroenterology;  Laterality: N/A;   ESOPHAGOGASTRODUODENOSCOPY (EGD) WITH PROPOFOL N/A 10/02/2020   Procedure: ESOPHAGOGASTRODUODENOSCOPY (EGD) WITH PROPOFOL;  Surgeon: Toney Reil, MD;  Location: [facility] ENDOSCOPY;  Service: Gastroenterology;  Laterality: N/A;   PROSTATE SURGERY      Family Psychiatric History: Please see initial evaluation for full details. I have reviewed the history. No updates at this time.     Family History:  Family History  Problem Relation Age of Onset   COPD Mother    Prostate cancer Father    Cancer - Lung Father    Depression Sister    Stomach cancer Sister    Alcohol abuse Brother    Heart block Brother    Breast cancer Sister    Prostate cancer Brother    Skin cancer Brother     Social History:  Social History   Socioeconomic History   Marital status: Married    Spouse name: Not on file   Number of children: Not on file   Years of education: Not on file  Highest education level: Not on file  Occupational History   Not on file  Tobacco Use   Smoking status: Every Day    Packs/day: 1.00    Years: 47.00    Additional pack years: 0.00    Total pack years: 47.00    Types: Cigarettes    Start date: 05/06/1971   Smokeless tobacco: Never  Vaping Use   Vaping Use: Never used  Substance and Sexual Activity   Alcohol use: Yes    Alcohol/week: 16.0 standard drinks of alcohol    Types: 6 Cans of beer, 10 Shots of liquor per week    Comment: in 1 to 2 times a week    Drug use: No   Sexual activity: Yes  Other Topics Concern   Not on file  Social History Narrative   Not on file   Social Determinants of Health   Financial Resource Strain: Low Risk  (09/07/2022)   Overall Financial Resource Strain (CARDIA)    Difficulty of Paying Living  Expenses: Not hard at all  Food Insecurity: No Food Insecurity (09/07/2022)   Hunger Vital Sign    Worried About Running Out of Food in the Last Year: Never true    Ran Out of Food in the Last Year: Never true  Transportation Needs: No Transportation Needs (09/07/2022)   PRAPARE - Administrator, Civil Service (Medical): No    Lack of Transportation (Non-Medical): No  Physical Activity: Inactive (09/07/2022)   Exercise Vital Sign    Days of Exercise per Week: 0 days    Minutes of Exercise per Session: 0 min  Stress: No Stress Concern Present (09/07/2022)   [employer] of Occupational Health - Occupational Stress Questionnaire    Feeling of Stress : Not at all  Social Connections: Socially Isolated (09/07/2022)   Social Connection and Isolation Panel [NHANES]    Frequency of Communication with Friends and Family: Three times a week    Frequency of Social Gatherings with Friends and Family: Once a week    Attends Religious Services: Never    Database administrator or Organizations: No    Attends Banker Meetings: Never    Marital Status: Separated    Allergies:  Allergies  Allergen Reactions   Chlorpheniramine-Phenylephrine Other (See Comments) and Nausea Only    Actifed Makes cold symptoms worse Other reaction(s): Unknown Actifed Makes cold symptoms worse Other reaction(s): Unknown     Metabolic Disorder Labs: Lab Results  Component Value Date   HGBA1C 5.4 09/16/2021   No results found for: "PROLACTIN" Lab Results  Component Value Date   CHOL 176 11/13/2021   TRIG 130 11/13/2021   HDL 70 11/13/2021   CHOLHDL 2.5 11/13/2021   LDLCALC 84 11/13/2021   LDLCALC 149 (H) 09/16/2021   Lab Results  Component Value Date   TSH 1.710 09/16/2021   TSH 1.270 03/09/2019    Therapeutic Level Labs: No results found for: "LITHIUM" No results found for: "VALPROATE" No results found for: "CBMZ"  Current Medications: Current Outpatient Medications   Medication Sig Dispense Refill   amLODipine (NORVASC) 5 MG tablet Take 5 mg by mouth daily.     atorvastatin (LIPITOR) 20 MG tablet TAKE 1 TABLET DAILY 90 tablet 3   enalapril (VASOTEC) 5 MG tablet Take 5 mg by mouth daily.     escitalopram (LEXAPRO) 20 MG tablet Take 1 tablet (20 mg total) by mouth daily. 90 tablet 1   ezetimibe (ZETIA) 10 MG tablet  Take 1 tablet (10 mg total) by mouth daily. 90 tablet 3   imipramine (TOFRANIL) 25 MG tablet TAKE 1 TABLET AT BEDTIME 90 tablet 3   omeprazole (PRILOSEC) 40 MG capsule TAKE 1 CAPSULE TWICE A DAY BEFORE MEALS 180 capsule 3   propranolol (INDERAL) 10 MG tablet Take 10 mg by mouth daily as needed.     sildenafil (REVATIO) 20 MG tablet Take 1 tablet (20 mg total) by mouth as directed. 90 tablet 3   No current facility-administered medications for this visit.     Musculoskeletal: Strength & Muscle Tone: within normal limits Gait & Station: normal Patient leans: N/A  Psychiatric Specialty Exam: Review of Systems  Psychiatric/Behavioral:  Negative for agitation, behavioral problems, confusion, decreased concentration, dysphoric mood, hallucinations, self-injury, sleep disturbance and suicidal ideas. The patient is nervous/anxious. The patient is not hyperactive.   All other systems reviewed and are negative.   Blood pressure (!) 182/76, pulse (!) 46, temperature 97.7 F (36.5 C), temperature source Skin, height 6' (1.829 m), weight 224 lb 6.4 oz (101.8 kg).Body mass index is 30.43 kg/m.  General Appearance: Fairly Groomed  Eye Contact:  Good  Speech:  Clear and Coherent  Volume:  Normal  Mood:   good  Affect:  Appropriate, Congruent, and Full Range  Thought Process:  Coherent  Orientation:  Full (Time, Place, and Person)  Thought Content: Logical   Suicidal Thoughts:  No  Homicidal Thoughts:  No  Memory:  Immediate;   Good  Judgement:  Good  Insight:  Good  Psychomotor Activity:  Normal  Concentration:  Concentration: Good and  Attention Span: Good  Recall:  Good  Fund of Knowledge: Good  Language: Good  Akathisia:  No  Handed:  Right  AIMS (if indicated): not done  Assets:  Communication Skills Desire for Improvement  ADL's:  Intact  Cognition: WNL  Sleep:  Good   Screenings: AUDIT    Flowsheet Row Clinical Support from 09/07/2022 in [facility] Clinical Support from 04/04/2020 in [facility]  Alcohol Use Disorder Identification Test Final Score (AUDIT) 4 4      GAD-7    Flowsheet Row Office Visit from 06/08/2022 in [facility]  Total GAD-7 Score 0      PHQ2-9    Flowsheet Row Office Visit from 09/10/2022 in [facility] Clinical Support from 09/07/2022 in [facility] Office Visit from 06/08/2022 in [facility] Office Visit from 05/20/2022 in [facility] Office Visit from 01/28/2022 in [facility]  PHQ-2 Total Score 0 0 2 1 1   PHQ-9 Total Score 0 -- 3 5 2       Flowsheet Row Admission (Discharged) from 09/25/2021 in [facility] Admission (Discharged) from 09/24/2021 in [facility] Video Visit from 05/20/2021 in [facility]  C-SSRS RISK CATEGORY No Risk No Risk No Risk        Assessment and Plan:  Richard HARTFIEL is a 72 y.o. year old male with a history of  anxiety,CKD stage III B secondary to chronic over use of aspirin and NSAID, secondary hyperparathyroidism,  hypertension, cirrhosis of liver secondary to fatty liver, history of prostate cancer,, who presents for follow up appointment for below.   1. Panic disorder Acute stressors include:  Other stressors include:    History: started to have  panic attack on the way to [facility] in 2003, relapsed in panic  attacks when tried to taper off a few years ago. Originally on lexapro 20 mg daily, propranolol 10 mg prn    He denies any significant mood symptoms except self-limiting anxiety at times when he drives.  Will continue current dose of Lexapro as maintenance treatment for panic disorder.  He expressed understanding that his care can be transferred to PCP if he prefers/his PCP agrees with this.  # smoking cessation He has been able to cut down cigarette use.  He is not interested in pharmacological treatment at this time.  Will continue motivational interview.   # Fatigue He reports non restorative sleep, and does snoring.  Although sleep evaluation is recommended, he is not interested at this time.  # Hypertension He has hypertension on today's evaluation.  He takes Norvasc regularly.  He agreed to measure home BP and contacts his PCP if it is persistently elevated.    Plan Continue lexapro 20 mg daily (QTc 456 msec on 12/2021. IRBBB, HR 80) Next appointment: 11/4 at 11 am, IP   - on imipramine for stress induced incontinence - on metoprolol 20 mg daily as needed for anxiety [email or web address]     The patient demonstrates the following risk factors for suicide: Chronic risk factors for suicide include: psychiatric disorder of anxiety . Acute risk factors for suicide include: N/A. Protective factors for this patient include: positive social support, coping skills, and hope for the future. Considering these factors, the overall suicide risk at this point appears to be low. Patient is appropriate for outpatient follow up.       Collaboration of Care: Collaboration of Care: Other reviewed notes in Epic  Patient/Guardian was advised Release of Information must be obtained prior to any record release in order to collaborate their care with an outside provider. Patient/Guardian was advised if they have not already done so to contact the registration department to sign all necessary forms in order for  [place] to release information regarding their care.   Consent: Patient/Guardian gives verbal consent for treatment and assignment of benefits for services provided during this visit. Patient/Guardian expressed understanding and agreed to proceed.    Neysa Hotter, MD 12/07/2022, 11:31 AM

## 2022-12-07 ENCOUNTER — Ambulatory Visit (INDEPENDENT_AMBULATORY_CARE_PROVIDER_SITE_OTHER): Payer: Medicare Other | Admitting: Psychiatry

## 2022-12-07 ENCOUNTER — Encounter: Payer: Self-pay | Admitting: Psychiatry

## 2022-12-07 VITALS — BP 182/76 | HR 46 | Temp 97.7°F | Ht 72.0 in | Wt 224.4 lb

## 2022-12-07 DIAGNOSIS — F410 Panic disorder [episodic paroxysmal anxiety] without agoraphobia: Secondary | ICD-10-CM

## 2022-12-07 NOTE — Addendum Note (Signed)
Addended by: Neysa Hotter on: 12/07/2022 11:32 AM   Modules accepted: Level of Service

## 2022-12-07 NOTE — Patient Instructions (Signed)
Continue lexapro 20 mg daily  Next appointment: 11/4 at 11 am

## 2022-12-09 ENCOUNTER — Ambulatory Visit (INDEPENDENT_AMBULATORY_CARE_PROVIDER_SITE_OTHER): Payer: Medicare Other | Admitting: Dermatology

## 2022-12-09 ENCOUNTER — Encounter: Payer: Self-pay | Admitting: Dermatology

## 2022-12-09 VITALS — BP 121/62 | HR 48

## 2022-12-09 DIAGNOSIS — L820 Inflamed seborrheic keratosis: Secondary | ICD-10-CM | POA: Diagnosis not present

## 2022-12-09 DIAGNOSIS — D692 Other nonthrombocytopenic purpura: Secondary | ICD-10-CM

## 2022-12-09 DIAGNOSIS — L821 Other seborrheic keratosis: Secondary | ICD-10-CM

## 2022-12-09 DIAGNOSIS — D1801 Hemangioma of skin and subcutaneous tissue: Secondary | ICD-10-CM | POA: Diagnosis not present

## 2022-12-09 DIAGNOSIS — H61022 Chronic perichondritis of left external ear: Secondary | ICD-10-CM | POA: Diagnosis not present

## 2022-12-09 DIAGNOSIS — Z5111 Encounter for antineoplastic chemotherapy: Secondary | ICD-10-CM | POA: Diagnosis not present

## 2022-12-09 DIAGNOSIS — Z1283 Encounter for screening for malignant neoplasm of skin: Secondary | ICD-10-CM

## 2022-12-09 DIAGNOSIS — L814 Other melanin hyperpigmentation: Secondary | ICD-10-CM

## 2022-12-09 DIAGNOSIS — W908XXA Exposure to other nonionizing radiation, initial encounter: Secondary | ICD-10-CM | POA: Diagnosis not present

## 2022-12-09 DIAGNOSIS — X32XXXA Exposure to sunlight, initial encounter: Secondary | ICD-10-CM

## 2022-12-09 DIAGNOSIS — Z79899 Other long term (current) drug therapy: Secondary | ICD-10-CM

## 2022-12-09 DIAGNOSIS — Z872 Personal history of diseases of the skin and subcutaneous tissue: Secondary | ICD-10-CM

## 2022-12-09 DIAGNOSIS — L570 Actinic keratosis: Secondary | ICD-10-CM

## 2022-12-09 DIAGNOSIS — D229 Melanocytic nevi, unspecified: Secondary | ICD-10-CM

## 2022-12-09 DIAGNOSIS — H61002 Unspecified perichondritis of left external ear: Secondary | ICD-10-CM

## 2022-12-09 DIAGNOSIS — Z7189 Other specified counseling: Secondary | ICD-10-CM

## 2022-12-09 DIAGNOSIS — L578 Other skin changes due to chronic exposure to nonionizing radiation: Secondary | ICD-10-CM

## 2022-12-09 NOTE — Progress Notes (Signed)
Follow-Up Visit   Subjective  Richard Bean is a 72 y.o. male who presents for the following: Skin Cancer Screening and Full Body Skin Exam. Hx AK's. No personal hx of skin cancer. Has few areas of concern.  The patient presents for Total-Body Skin Exam (TBSE) for skin cancer screening and mole check. The patient has spots, moles and lesions to be evaluated, some may be new or changing and the patient has concerns that these could be cancer.  The following portions of the chart were reviewed this encounter and updated as appropriate: medications, allergies, medical history  Review of Systems:  No other skin or systemic complaints except as noted in HPI or Assessment and Plan.  Objective  Well appearing patient in no apparent distress; mood and affect are within normal limits.  A full examination was performed including scalp, head, eyes, ears, nose, lips, neck, chest, axillae, abdomen, back, buttocks, bilateral upper extremities, bilateral lower extremities, hands, feet, fingers, toes, fingernails, and toenails. All findings within normal limits unless otherwise noted below.   Relevant physical exam findings are noted in the Assessment and Plan.  Chest x1, right ear choncha x1 (2) Erythematous keratotic or waxy stuck-on papule or plaque.  Scalp x19, left superior helix (within CNCH) x1 (20) Erythematous thin papules/macules with gritty scale.     Assessment & Plan   LENTIGINES, SEBORRHEIC KERATOSES, HEMANGIOMAS - Benign normal skin lesions - Benign-appearing - Call for any changes  MELANOCYTIC NEVI - Tan-brown and/or pink-flesh-colored symmetric macules and papules - Benign appearing on exam today - Observation - Call clinic for new or changing moles - Recommend daily use of broad spectrum spf 30+ sunscreen to sun-exposed areas.   ACTINIC DAMAGE WITH PRECANCEROUS ACTINIC KERATOSES Counseling for Topical Chemotherapy Management: Patient exhibits: - Severe, confluent  actinic changes with pre-cancerous actinic keratoses that is secondary to cumulative UV radiation exposure over time - Condition that is severe; chronic, not at goal. - diffuse scaly erythematous macules and papules with underlying dyspigmentation - Discussed Prescription "Field Treatment" topical Chemotherapy for Severe, Chronic Confluent Actinic Changes with Pre-Cancerous Actinic Keratoses Field treatment involves treatment of an entire area of skin that has confluent Actinic Changes (Sun/ Ultraviolet light damage) and PreCancerous Actinic Keratoses by method of PhotoDynamic Therapy (PDT) and/or prescription Topical Chemotherapy agents such as 5-fluorouracil, 5-fluorouracil/calcipotriene, and/or imiquimod.  The purpose is to decrease the number of clinically evident and subclinical PreCancerous lesions to prevent progression to development of skin cancer by chemically destroying early precancer changes that may or may not be visible.  It has been shown to reduce the risk of developing skin cancer in the treated area. As a result of treatment, redness, scaling, crusting, and open sores may occur during treatment course. One or more than one of these methods may be used and may have to be used several times to control, suppress and eliminate the PreCancerous changes. Discussed treatment course, expected reaction, and possible side effects. - Recommend daily broad spectrum sunscreen SPF 30+ to sun-exposed areas, reapply every 2 hours as needed.  - Staying in the shade or wearing long sleeves, sun glasses (UVA+UVB protection) and wide brim hats (4-inch brim around the entire circumference of the hat) are also recommended. - Call for new or changing lesions.   Start in 1 month.  - Start 5-fluorouracil/calcipotriene cream twice a day for 7 days to affected areas including scalp. Prescription sent to [facility]. Patient advised they will receive an email to purchase the medication  online and have it sent to their home. Patient provided with handout reviewing treatment course and side effects and advised to call or message [place] on MyChart with any concerns.  Reviewed course of treatment and expected reaction.  Patient advised to expect inflammation and crusting and advised that erosions are possible.  Patient advised to be diligent with sun protection during and after treatment. Counseled to keep medication out of reach of children and pets.   SKIN CANCER SCREENING PERFORMED TODAY.  Chondrodermatitis Nodularis Chronica Helicis With AK (CNCH or CNH) is a common, benign inflammatory condition of the ear cartilage and overlying skin associated with very sensitive tender papule(s).  Trauma or pressure from sleeping on the ear or from cell phone use and sun damage may be exacerbating factors.  Treatment may include using a C-shaped airplane neck pillow for sleeping on the side of the head so no pressure is on the ear.  Other treatments include topical or intralesional steroids; liquid nitrogen or laser destruction; shave removal or excision.  The condition can be difficult to treat and persist or recur despite treatment.  Purpura - Chronic; persistent and recurrent.  Treatable, but not curable. - Violaceous macules and patches at arms - Benign - Related to trauma, age, sun damage and/or use of blood thinners, chronic use of topical and/or oral steroids - Observe - Can use OTC arnica containing moisturizer such as Dermend Bruise Formula if desired - Call for worsening or other concerns  Inflamed seborrheic keratosis (2) Chest x1, right ear choncha x1  Symptomatic, irritating, patient would like treated.  Destruction of lesion - Chest x1, right ear choncha x1  Destruction method: cryotherapy   Informed consent: discussed and consent obtained   Lesion destroyed using liquid nitrogen: Yes   Region frozen until ice ball extended beyond lesion: Yes   Outcome: patient tolerated  procedure well with no complications   Post-procedure details: wound care instructions given   Additional details:  Prior to procedure, discussed risks of blister formation, small wound, skin dyspigmentation, or rare scar following cryotherapy. Recommend Vaseline ointment to treated areas while healing.   AK (actinic keratosis) (20) Scalp x19, left superior helix (within [facility]) x1  Actinic keratoses are precancerous spots that appear secondary to cumulative UV radiation exposure/sun exposure over time. They are chronic with expected duration over 1 year. A portion of actinic keratoses will progress to squamous cell carcinoma of the skin. It is not possible to reliably predict which spots will progress to skin cancer and so treatment is recommended to prevent development of skin cancer.  Recommend daily broad spectrum sunscreen SPF 30+ to sun-exposed areas, reapply every 2 hours as needed.  Recommend staying in the shade or wearing long sleeves, sun glasses (UVA+UVB protection) and wide brim hats (4-inch brim around the entire circumference of the hat). Call for new or changing lesions.  Destruction of lesion - Scalp x19, left superior helix (within CNCH) x1 Complexity: simple   Destruction method: cryotherapy   Informed consent: discussed and consent obtained   Timeout:  patient name, date of birth, surgical site, and procedure verified Lesion destroyed using liquid nitrogen: Yes   Region frozen until ice ball extended beyond lesion: Yes   Outcome: patient tolerated procedure well with no complications   Post-procedure details: wound care instructions given   Additional details:  Prior to procedure, discussed risks of blister formation, small wound, skin dyspigmentation, or rare scar following cryotherapy. Recommend Vaseline ointment to treated areas while healing.  Return in about 1 year (around 12/09/2023) for TBSE.  I, Lawson Radar, CMA, am acting as scribe for Armida Sans,  MD.  Documentation: I have reviewed the above documentation for accuracy and completeness, and I agree with the above.  Armida Sans, MD

## 2022-12-09 NOTE — Patient Instructions (Addendum)
Start in 1 month.  - Start 5-fluorouracil/calcipotriene cream twice a day for 7 days to affected areas including scalp. Prescription sent to [facility]. Patient advised they will receive an email to purchase the medication online and have it sent to their home. Patient provided with handout reviewing treatment course and side effects and advised to call or message [place] on MyChart with any concerns.   Reviewed course of treatment and expected reaction.  Patient advised to expect inflammation and crusting and advised that erosions are possible.  Patient advised to be diligent with sun protection during and after treatment. Counseled to keep medication out of reach of children and pets.    Instructions for [facility]  One or more of your medications was sent to the [facility]. You will receive an email from them and can purchase the medicine through that link. It will then be mailed to your home at the address you confirmed. If for any reason you do not receive an email from them, please check your spam folder. If you still do not find the email, please let us know. [facility] phone number is [phone].    Cryotherapy Aftercare  Wash gently with soap and water everyday.   Apply Vaseline Jelly daily until healed.    Recommend daily broad spectrum sunscreen SPF 30+ to sun-exposed areas, reapply every 2 hours as needed. Call for new or changing lesions.  Staying in the shade or wearing long sleeves, sun glasses (UVA+UVB protection) and wide brim hats (4-inch brim around the entire circumference of the hat) are also recommended for sun protection.    Melanoma ABCDEs  Melanoma is the most dangerous type of skin cancer, and is the leading cause of death from skin disease.  You are more likely to develop melanoma if you: Have light-colored skin, light-colored eyes, or red or blond hair Spend a lot of time in the  sun Tan regularly, either outdoors or in a tanning bed Have had blistering sunburns, especially during childhood Have a close family member who has had a melanoma Have atypical moles or large birthmarks  Early detection of melanoma is key since treatment is typically straightforward and cure rates are extremely high if we catch it early.   The first sign of melanoma is often a change in a mole or a new dark spot.  The ABCDE system is a way of remembering the signs of melanoma.  A for asymmetry:  The two halves do not match. B for border:  The edges of the growth are irregular. C for color:  A mixture of colors are present instead of an even brown color. D for diameter:  Melanomas are usually (but not always) greater than 6mm - the size of a pencil eraser. E for evolution:  The spot keeps changing in size, shape, and color.  Please check your skin once per month between visits. You can use a small mirror in front and a large mirror behind you to keep an eye on the back side or your body.   If you see any new or changing lesions before your next follow-up, please call to schedule a visit.  Please continue daily skin protection including broad spectrum sunscreen SPF 30+ to sun-exposed areas, reapplying every 2 hours as needed when you're outdoors.   Staying in the shade or wearing long sleeves, sun glasses (UVA+UVB protection) and wide brim hats (4-inch brim around the entire circumference of the hat) are  also recommended for sun protection.    Due to recent changes in healthcare laws, you may see results of your pathology and/or laboratory studies on MyChart before the doctors have had a chance to review them. We understand that in some cases there may be results that are confusing or concerning to you. Please understand that not all results are received at the same time and often the doctors may need to interpret multiple results in order to provide you with the best plan of care or course of  treatment. Therefore, we ask that you please give [place] 2 business days to thoroughly review all your results before contacting the office for clarification. Should we see a critical lab result, you will be contacted sooner.   If You Need Anything After Your Visit  If you have any questions or concerns for your doctor, please call our main line at [phone] and press option 4 to reach your doctor's medical assistant. If no one answers, please leave a voicemail as directed and we will return your call as soon as possible. Messages left after 4 pm will be answered the following business day.   You may also send [place] a message via MyChart. We typically respond to MyChart messages within 1-2 business days.  For prescription refills, please ask your pharmacy to contact our office. Our fax number is [phone].  If you have an urgent issue when the clinic is closed that cannot wait until the next business day, you can page your doctor at the number below.    Please note that while we do our best to be available for urgent issues outside of office hours, we are not available 24/7.   If you have an urgent issue and are unable to reach [place], you may choose to seek medical care at your doctor's office, retail clinic, urgent care center, or emergency room.  If you have a medical emergency, please immediately call 911 or go to the emergency department.  Pager Numbers  - Dr. Gwen Pounds: [phone]  - Dr. Neale Burly: [phone]  - Dr. Roseanne Reno: [phone]  In the event of inclement weather, please call our main line at [phone] for an update on the status of any delays or closures.  Dermatology Medication Tips: Please keep the boxes that topical medications come in in order to help keep track of the instructions about where and how to use these. Pharmacies typically print the medication instructions only on the boxes and not directly on the medication tubes.   If your medication is too expensive,  please contact our office at [phone] option 4 or send [place] a message through MyChart.   We are unable to tell what your co-pay for medications will be in advance as this is different depending on your insurance coverage. However, we may be able to find a substitute medication at lower cost or fill out paperwork to get insurance to cover a needed medication.   If a prior authorization is required to get your medication covered by your insurance company, please allow [place] 1-2 business days to complete this process.  Drug prices often vary depending on where the prescription is filled and some pharmacies may offer cheaper prices.  The website www.goodrx.com contains coupons for medications through different pharmacies. The prices here do not account for what the cost may be with help from insurance (it may be cheaper with your insurance), but the website can give you the price if you did not use any insurance.  - You  can print the associated coupon and take it with your prescription to the pharmacy.  - You may also stop by our office during regular business hours and pick up a GoodRx coupon card.  - If you need your prescription sent electronically to a different pharmacy, notify our office through [facility] or by phone at [phone] option 4.     Si Usted Necesita Algo Despus de Su Visita  Tambin puede enviarnos un mensaje a travs de Pharmacist, community. Por lo general respondemos a los mensajes de MyChart en el transcurso de 1 a 2 das hbiles.  Para renovar recetas, por favor pida a su farmacia que se ponga en contacto con nuestra oficina. Harland Dingwall de fax es [place] [phone].  Si tiene un asunto urgente cuando la clnica est cerrada y que no puede esperar hasta el siguiente da hbil, puede llamar/localizar a su doctor(a) al nmero que aparece a continuacin.   Por favor, tenga en cuenta que aunque hacemos todo lo posible para estar disponibles para asuntos urgentes fuera del  horario de [place], no estamos disponibles las 24 horas del da, los 7 das de la [place].   Si tiene un problema urgente y no puede comunicarse con nosotros, puede optar por buscar atencin mdica  en el consultorio de su doctor(a), en una clnica privada, en un centro de atencin urgente o en una sala de emergencias.  Si tiene Engineering geologist, por favor llame inmediatamente al 911 o vaya a la sala de emergencias.  Nmeros de bper  - Dr. Nehemiah Massed: [phone]  - Dra. Moye: [phone]  - Dra. Nicole Kindred: [phone]  En caso de inclemencias del [place], por favor llame a Johnsie Kindred principal al [phone] para una actualizacin sobre el [place] de cualquier retraso o cierre.  Consejos para la medicacin en dermatologa: Por favor, guarde las cajas en las que vienen los medicamentos de uso tpico para ayudarle a seguir las instrucciones sobre dnde y cmo usarlos. Las farmacias generalmente imprimen las instrucciones del medicamento slo en las cajas y no directamente en los tubos del [place].   Si su medicamento es muy caro, por favor, pngase en contacto con Zigmund Daniel llamando al [phone] y presione la opcin 4 o envenos un mensaje a travs de Pharmacist, community.   No podemos decirle cul ser su copago por los medicamentos por adelantado ya que esto es diferente dependiendo de la cobertura de su seguro. Sin embargo, es posible que podamos encontrar un medicamento sustituto a Electrical engineer un formulario para que el seguro cubra el medicamento que se considera necesario.   Si se requiere una autorizacin previa para que su compaa de seguros [place] su medicamento, por favor permtanos de 1 a 2 das hbiles para completar este proceso.  Los precios de los medicamentos varan con frecuencia dependiendo del Environmental consultant de dnde se surte la receta y alguna farmacias pueden ofrecer precios ms baratos.  El sitio web www.goodrx.com tiene cupones para medicamentos de Office manager. Los precios aqu no tienen en cuenta lo que podra costar con la ayuda del seguro (puede ser ms barato con su seguro), pero el sitio web puede darle el precio si no utiliz Research scientist (physical sciences).  - Puede imprimir el cupn correspondiente y llevarlo con su receta a la farmacia.  - Tambin puede pasar por nuestra oficina durante el horario de atencin regular y Charity fundraiser una tarjeta de cupones de GoodRx.  - Si necesita que su receta se enve electrnicamente a una farmacia diferente, informe a  nuestra oficina a travs de MyChart de [place] o por telfono llamando al [phone] y presione la opcin 4.

## 2022-12-16 ENCOUNTER — Encounter: Payer: Self-pay | Admitting: Dermatology

## 2023-02-04 ENCOUNTER — Other Ambulatory Visit: Payer: Self-pay | Admitting: Physician Assistant

## 2023-03-11 DIAGNOSIS — N1831 Chronic kidney disease, stage 3a: Secondary | ICD-10-CM | POA: Diagnosis not present

## 2023-03-11 DIAGNOSIS — I129 Hypertensive chronic kidney disease with stage 1 through stage 4 chronic kidney disease, or unspecified chronic kidney disease: Secondary | ICD-10-CM | POA: Diagnosis not present

## 2023-03-17 ENCOUNTER — Ambulatory Visit: Payer: Medicare Other | Admitting: Family Medicine

## 2023-03-17 ENCOUNTER — Ambulatory Visit: Payer: Medicare Other | Admitting: Physician Assistant

## 2023-03-17 ENCOUNTER — Encounter: Payer: Self-pay | Admitting: Family Medicine

## 2023-03-17 ENCOUNTER — Ambulatory Visit (INDEPENDENT_AMBULATORY_CARE_PROVIDER_SITE_OTHER): Payer: Medicare Other | Admitting: Family Medicine

## 2023-03-17 VITALS — BP 110/58 | HR 79 | Wt 216.6 lb

## 2023-03-17 DIAGNOSIS — F1024 Alcohol dependence with alcohol-induced mood disorder: Secondary | ICD-10-CM

## 2023-03-17 DIAGNOSIS — N2581 Secondary hyperparathyroidism of renal origin: Secondary | ICD-10-CM | POA: Diagnosis not present

## 2023-03-17 DIAGNOSIS — I10 Essential (primary) hypertension: Secondary | ICD-10-CM | POA: Diagnosis not present

## 2023-03-17 DIAGNOSIS — Z23 Encounter for immunization: Secondary | ICD-10-CM | POA: Insufficient documentation

## 2023-03-17 DIAGNOSIS — N1831 Chronic kidney disease, stage 3a: Secondary | ICD-10-CM | POA: Diagnosis not present

## 2023-03-17 DIAGNOSIS — F4001 Agoraphobia with panic disorder: Secondary | ICD-10-CM

## 2023-03-17 DIAGNOSIS — E871 Hypo-osmolality and hyponatremia: Secondary | ICD-10-CM | POA: Diagnosis not present

## 2023-03-17 DIAGNOSIS — E782 Mixed hyperlipidemia: Secondary | ICD-10-CM

## 2023-03-17 DIAGNOSIS — Z720 Tobacco use: Secondary | ICD-10-CM

## 2023-03-17 DIAGNOSIS — I129 Hypertensive chronic kidney disease with stage 1 through stage 4 chronic kidney disease, or unspecified chronic kidney disease: Secondary | ICD-10-CM | POA: Diagnosis not present

## 2023-03-17 DIAGNOSIS — N1832 Chronic kidney disease, stage 3b: Secondary | ICD-10-CM | POA: Diagnosis not present

## 2023-03-17 MED ORDER — ENALAPRIL MALEATE 5 MG PO TABS: 5.0000 mg | ORAL_TABLET | Freq: Every day | ORAL | 3 refills | Status: DC

## 2023-03-17 MED ORDER — AMLODIPINE BESYLATE 5 MG PO TABS: 5.0000 mg | ORAL_TABLET | Freq: Every day | ORAL | 3 refills | Status: DC

## 2023-03-17 MED ORDER — PROPRANOLOL HCL 10 MG PO TABS: 10.0000 mg | ORAL_TABLET | Freq: Every day | ORAL | 3 refills | Status: DC | PRN

## 2023-03-17 NOTE — Assessment & Plan Note (Signed)
Chronic, stable per pt report Continue low dose propranolol as needed

## 2023-03-17 NOTE — Assessment & Plan Note (Signed)
Chronic, stable Remains pre-contemplative iso cessation

## 2023-03-17 NOTE — Assessment & Plan Note (Signed)
Chronic, overdue for FLP Recommend labs annually The 10-year ASCVD risk score (Arnett DK, et al., 2019) is: 17.9% Declines reduction of tobacco, EtOH at this time

## 2023-03-17 NOTE — Assessment & Plan Note (Signed)
Chronic, stable Followed by nephrology and cardiology Refills provided

## 2023-03-17 NOTE — Progress Notes (Signed)
Established patient visit   Patient: Richard Bean   DOB: 22-Jul-1951   72 y.o. Male  MRN: [id number] Visit Date: 03/17/2023  Today's healthcare provider: Jacky Kindle, FNP  Introduced to nurse practitioner role and practice setting.  All questions answered.  Discussed provider/patient relationship and expectations.  Subjective    HPI    Medications: Outpatient Medications Prior to Visit  Medication Sig   atorvastatin (LIPITOR) 20 MG tablet TAKE 1 TABLET DAILY   ezetimibe (ZETIA) 10 MG tablet Take 1 tablet (10 mg total) by mouth daily.   imipramine (TOFRANIL) 25 MG tablet TAKE 1 TABLET AT BEDTIME   omeprazole (PRILOSEC) 40 MG capsule TAKE 1 CAPSULE TWICE A DAY BEFORE MEALS   sildenafil (REVATIO) 20 MG tablet Take 1 tablet (20 mg total) by mouth as directed.   [DISCONTINUED] amLODipine (NORVASC) 5 MG tablet Take 5 mg by mouth daily.   [DISCONTINUED] enalapril (VASOTEC) 5 MG tablet Take 5 mg by mouth daily.   [DISCONTINUED] propranolol (INDERAL) 10 MG tablet Take 10 mg by mouth daily as needed.   escitalopram (LEXAPRO) 20 MG tablet Take 1 tablet (20 mg total) by mouth daily.   No facility-administered medications prior to visit.      Objective    BP (!) 110/58 (BP Location: Right Arm, Patient Position: Sitting, Cuff Size: Normal)   Pulse 79   Wt 216 lb 9.6 oz (98.2 kg)   SpO2 100%   BMI 29.38 kg/m  BP Readings from Last 3 Encounters:  03/17/23 (!) 110/58  12/09/22 121/62  12/07/22 (!) 182/76   Physical Exam Vitals and nursing note reviewed.  Constitutional:      Appearance: Normal appearance. He is normal weight.  HENT:     Head: Normocephalic and atraumatic.  Eyes:     Pupils: Pupils are equal, round, and reactive to light.  Cardiovascular:     Rate and Rhythm: Normal rate and regular rhythm.     Pulses: Normal pulses.     Heart sounds: Normal heart sounds.  Pulmonary:     Effort: Pulmonary effort is normal.     Breath sounds: Normal breath sounds.   Musculoskeletal:        General: Normal range of motion.     Cervical back: Normal range of motion.  Skin:    General: Skin is warm and dry.     Capillary Refill: Capillary refill takes less than 2 seconds.  Neurological:     General: No focal deficit present.     Mental Status: He is alert and oriented to person, place, and time. Mental status is at baseline.      No results found for any visits on 03/17/23.  Assessment & Plan     Problem List Items Addressed This Visit       Cardiovascular and Mediastinum   Hypertension - Primary    Chronic, stable Followed by nephrology and cardiology Refills provided      Relevant Medications   amLODipine (NORVASC) 5 MG tablet   enalapril (VASOTEC) 5 MG tablet   propranolol (INDERAL) 10 MG tablet     Nervous and Auditory   Alcohol dependence with alcohol-induced mood disorder (HCC)    Chronic, stable Pt reports he has no issues as "he drinks well" Continue to monitor daily use as this can worsen his chronic conditions including HTN and mood symptoms; previously f/b psych in 5/24        Other   Flu vaccine need  Relevant Orders   Flu Vaccine QUAD High Dose(Fluad) (Completed)   Immunization due   Relevant Orders   Flu Vaccine QUAD High Dose(Fluad) (Completed)   Mixed hyperlipidemia    Chronic, overdue for FLP Recommend labs annually The 10-year ASCVD risk score (Arnett DK, et al., 2019) is: 17.9% Declines reduction of tobacco, EtOH at this time      Relevant Medications   amLODipine (NORVASC) 5 MG tablet   enalapril (VASOTEC) 5 MG tablet   propranolol (INDERAL) 10 MG tablet   Other Relevant Orders   Lipid panel   Panic disorder with agoraphobia and mild panic attacks    Chronic, stable per pt report Continue low dose propranolol as needed      Tobacco use    Chronic, stable Remains pre-contemplative iso cessation      Return in about 1 week (around 03/24/2023) for labs.     Leilani Merl, FNP, have  reviewed all documentation for this visit. The documentation on 03/17/23 for the exam, diagnosis, procedures, and orders are all accurate and complete.  Jacky Kindle, FNP  [facility] [phone] (phone) [phone] (fax)  [facility]

## 2023-03-17 NOTE — Assessment & Plan Note (Signed)
Chronic, stable Pt reports he has no issues as "he drinks well" Continue to monitor daily use as this can worsen his chronic conditions including HTN and mood symptoms; previously f/b psych in 5/24

## 2023-03-22 ENCOUNTER — Ambulatory Visit: Payer: Medicare Other | Admitting: Gastroenterology

## 2023-04-20 DIAGNOSIS — E782 Mixed hyperlipidemia: Secondary | ICD-10-CM | POA: Diagnosis not present

## 2023-04-21 LAB — LIPID PANEL
Chol/HDL Ratio: 1.9 ratio (ref 0.0–5.0)
Cholesterol, Total: 132 mg/dL (ref 100–199)
HDL: 71 mg/dL (ref >39)
LDL Chol Calc (NIH): 47 mg/dL (ref 0–99)
Triglycerides: 71 mg/dL (ref 0–149)
VLDL Cholesterol Cal: 14 mg/dL (ref 5–40)

## 2023-04-21 NOTE — Progress Notes (Signed)
Cholesterol remains at goal; The 10-year ASCVD risk score (Arnett DK, et al., 2019) is: 15.8% I continue to recommend diet low in saturated fat and regular exercise - 30 min at least 5 times per week

## 2023-05-09 ENCOUNTER — Other Ambulatory Visit: Payer: Self-pay | Admitting: Physician Assistant

## 2023-05-09 DIAGNOSIS — F3342 Major depressive disorder, recurrent, in full remission: Secondary | ICD-10-CM

## 2023-05-24 NOTE — Progress Notes (Signed)
BH MD/PA/NP OP Progress Note  05/31/2023 11:29 AM Richard NEILAN  MRN:  [id number]  Chief Complaint:  Chief Complaint  Patient presents with   Follow-up   HPI:  This is a follow-up appointment for panic disorder.  He states that he has been doing good.  He is just back from trip to [place], [place] and other.  He went there with his friends, and he enjoyed it.  He is hoping to [place] next spring.  He tries to go there every year.  He does pool twice a week, and does yard work.  Although his primary care agreed to take over the prescription, the provider has left the practice.  He is waiting to see another person.  He drinks coffee 24 oz  every day.  He is willing to reduce the amount.  He sleeps fair.  He denies feeling depressed.  He denies SI.   Daily routine: go to lake, shoot pool, plays with his dog Exercise: walk 3 miles every day Employment: retired in 2006, Sport and exercise psychologist Support: brothers Household: by himself Marital status: separated, married twice Number of children: 1 daughter in [place], 71 yo He grew up in a farm [place]. He had a "fine" childhood. he loved his parents. He has 2 living brother. He eats lunch with them weekly, occasionally with his cousin as well    Wt Readings from Last 3 Encounters:  05/31/23 219 lb (99.3 kg)  03/17/23 216 lb 9.6 oz (98.2 kg)  12/07/22 224 lb 6.4 oz (101.8 kg)     Daily routine: go to lake, shoot pool, plays with his dog Exercise: walk 3 miles every day Employment: retired in 2006, Sport and exercise psychologist Support: brothers Household: by himself Marital status: separated, married twice Number of children: 1 daughter in [place], 30 yo He grew up in a farm [place]. He had a "fine" childhood. he loved his parents. He has 2 living brother. He eats lunch with them weekly, occasionally with his cousin as well  Substance use   Tobacco Alcohol Other substances/  Current 1 PPD Liquor 4-5, 2-3 times per week, denies craving for alcohol denies  Past  2-3 PPD Same amount as above denies  Past Treatment           Visit Diagnosis:    ICD-10-CM   1. Panic disorder  F41.0       Past Psychiatric History: Please see initial evaluation for full details. I have reviewed the history. No updates at this time.     Past Medical History:  Past Medical History:  Diagnosis Date   Actinic keratosis    Anxiety    Cancer (HCC)    Depression    GERD (gastroesophageal reflux disease)    H/O prostate cancer    Heart murmur    Kidney disease    Liver disease    Panic disorder with agoraphobia     Past Surgical History:  Procedure Laterality Date   ANKLE SURGERY Right    COLONOSCOPY WITH PROPOFOL N/A 09/25/2021   Procedure: COLONOSCOPY WITH PROPOFOL;  Surgeon: Toney Reil, MD;  Location: [facility] ENDOSCOPY;  Service: Gastroenterology;  Laterality: N/A;   ESOPHAGOGASTRODUODENOSCOPY (EGD) WITH PROPOFOL N/A 06/26/2020   Procedure: ESOPHAGOGASTRODUODENOSCOPY (EGD) WITH PROPOFOL;  Surgeon: Toney Reil, MD;  Location: [facility] ENDOSCOPY;  Service: Gastroenterology;  Laterality: N/A;   ESOPHAGOGASTRODUODENOSCOPY (EGD) WITH PROPOFOL N/A 10/02/2020   Procedure: ESOPHAGOGASTRODUODENOSCOPY (EGD) WITH PROPOFOL;  Surgeon: Toney Reil, MD;  Location: [facility] ENDOSCOPY;  Service: Gastroenterology;  Laterality: N/A;   PROSTATE SURGERY      Family Psychiatric History: Please see initial evaluation for full details. I have reviewed the history. No updates at this time.    Family History:  Family History  Problem Relation Age of Onset   COPD Mother    Prostate cancer Father    Cancer - Lung Father    Depression Sister    Stomach cancer Sister    Alcohol abuse Brother    Heart block Brother    Breast cancer Sister    Prostate cancer Brother    Skin cancer Brother     Social History:  Social History   Socioeconomic History   Marital status: Married    Spouse name: Not on file   Number of children: Not on file   Years of education: Not  on file   Highest education level: Not on file  Occupational History   Not on file  Tobacco Use   Smoking status: Every Day    Current packs/day: 1.00    Average packs/day: 1 pack/day for 52.1 years (52.1 ttl pk-yrs)    Types: Cigarettes    Start date: 05/06/1971   Smokeless tobacco: Never  Vaping Use   Vaping status: Never Used  Substance and Sexual Activity   Alcohol use: Yes    Alcohol/week: 16.0 standard drinks of alcohol    Types: 6 Cans of beer, 10 Shots of liquor per week    Comment: in 1 to 2 times a week    Drug use: No   Sexual activity: Yes  Other Topics Concern   Not on file  Social History Narrative   Not on file   Social Determinants of Health   Financial Resource Strain: Low Risk  (09/07/2022)   Overall Financial Resource Strain (CARDIA)    Difficulty of Paying Living Expenses: Not hard at all  Food Insecurity: No Food Insecurity (09/07/2022)   Hunger Vital Sign    Worried About Running Out of Food in the Last Year: Never true    Ran Out of Food in the Last Year: Never true  Transportation Needs: No Transportation Needs (09/07/2022)   PRAPARE - Administrator, Civil Service (Medical): No    Lack of Transportation (Non-Medical): No  Physical Activity: Inactive (09/07/2022)   Exercise Vital Sign    Days of Exercise per Week: 0 days    Minutes of Exercise per Session: 0 min  Stress: No Stress Concern Present (09/07/2022)   [employer] of Occupational Health - Occupational Stress Questionnaire    Feeling of Stress : Not at all  Social Connections: Socially Isolated (09/07/2022)   Social Connection and Isolation Panel [NHANES]    Frequency of Communication with Friends and Family: Three times a week    Frequency of Social Gatherings with Friends and Family: Once a week    Attends Religious Services: Never    Database administrator or Organizations: No    Attends Banker Meetings: Never    Marital Status: Separated    Allergies:   Allergies  Allergen Reactions   Chlorpheniramine-Phenylephrine Other (See Comments) and Nausea Only    Actifed Makes cold symptoms worse Other reaction(s): Unknown Actifed Makes cold symptoms worse Other reaction(s): Unknown     Metabolic Disorder Labs: Lab Results  Component Value Date   HGBA1C 5.4 09/16/2021   No results found for: "PROLACTIN" Lab Results  Component Value Date   CHOL 132 04/20/2023   TRIG 71  04/20/2023   HDL 71 04/20/2023   CHOLHDL 1.9 04/20/2023   LDLCALC 47 04/20/2023   LDLCALC 84 11/13/2021   Lab Results  Component Value Date   TSH 1.710 09/16/2021   TSH 1.270 03/09/2019    Therapeutic Level Labs: No results found for: "LITHIUM" No results found for: "VALPROATE" No results found for: "CBMZ"  Current Medications: Current Outpatient Medications  Medication Sig Dispense Refill   amLODipine (NORVASC) 5 MG tablet Take 1 tablet (5 mg total) by mouth daily. 90 tablet 3   atorvastatin (LIPITOR) 20 MG tablet TAKE 1 TABLET DAILY 90 tablet 3   enalapril (VASOTEC) 5 MG tablet Take 1 tablet (5 mg total) by mouth daily. 90 tablet 3   escitalopram (LEXAPRO) 20 MG tablet TAKE 1 TABLET DAILY 90 tablet 3   ezetimibe (ZETIA) 10 MG tablet Take 1 tablet (10 mg total) by mouth daily. 90 tablet 3   imipramine (TOFRANIL) 25 MG tablet TAKE 1 TABLET AT BEDTIME 90 tablet 3   omeprazole (PRILOSEC) 40 MG capsule Take by mouth once.     propranolol (INDERAL) 10 MG tablet Take 1 tablet (10 mg total) by mouth daily as needed. 90 tablet 3   sildenafil (REVATIO) 20 MG tablet Take 1 tablet (20 mg total) by mouth as directed. 90 tablet 3   No current facility-administered medications for this visit.     Musculoskeletal: Strength & Muscle Tone: within normal limits Gait & Station: normal Patient leans: N/A  Psychiatric Specialty Exam: Review of Systems  Psychiatric/Behavioral: Negative.    All other systems reviewed and are negative.   Blood pressure (!) 157/69,  pulse 93, temperature 97.6 F (36.4 C), temperature source Skin, height 6' (1.829 m), weight 219 lb (99.3 kg).Body mass index is 29.7 kg/m.  General Appearance: Well Groomed  Eye Contact:  Good  Speech:  Clear and Coherent  Volume:  Normal  Mood:   good  Affect:  Appropriate, Congruent, and Full Range  Thought Process:  Coherent  Orientation:  Full (Time, Place, and Person)  Thought Content: Logical   Suicidal Thoughts:  No  Homicidal Thoughts:  No  Memory:  Immediate;   Good  Judgement:  Good  Insight:  Good  Psychomotor Activity:  Normal  Concentration:  Concentration: Good and Attention Span: Good  Recall:  Good  Fund of Knowledge: Good  Language: Good  Akathisia:  No  Handed:  Right  AIMS (if indicated): not done  Assets:  Communication Skills Desire for Improvement  ADL's:  Intact  Cognition: WNL  Sleep:  Fair   Screenings: AUDIT    Flowsheet Row Clinical Support from 09/07/2022 in [facility] Clinical Support from 04/04/2020 in [facility]  Alcohol Use Disorder Identification Test Final Score (AUDIT) 4 4      GAD-7    Flowsheet Row Office Visit from 03/17/2023 in [facility] Office Visit from 06/08/2022 in [facility]  Total GAD-7 Score 0 0      PHQ2-9    Flowsheet Row Office Visit from 03/17/2023 in [facility] Office Visit from 09/10/2022 in [facility] Clinical Support from 09/07/2022 in [facility] Office Visit from 06/08/2022 in [facility] Office Visit from 05/20/2022 in [facility]  PHQ-2 Total Score 0 0 0 2 1  PHQ-9 Total Score 2 0 -- 3 5  Flowsheet Row Admission (Discharged) from 09/25/2021 in [facility] Admission (Discharged) from 09/24/2021 in [facility]  [facility] Video Visit from 05/20/2021 in [facility]  C-SSRS RISK CATEGORY No Risk No Risk No Risk        Assessment and Plan:  JEREL SARDINA is a 73 y.o. year old male with a history of  anxiety,CKD stage III B secondary to chronic over use of aspirin and NSAID, secondary hyperparathyroidism,  hypertension, cirrhosis of liver secondary to fatty liver, history of prostate cancer,, who presents for follow up appointment for below.   1. Panic disorder History: started to have panic attack on the way to [facility] in 2003, relapsed in panic attacks when tried to taper off a few years ago. Originally on lexapro 20 mg daily, propranolol 10 mg prn    He denied any significant mood symptoms except self-limiting anxiety when he drives.  Noted that he has been consuming 24 oz of caffeine.  He agrees to try reduce the amount to mitigate risk of anxiety.  Will continue Lexapro as maintenance treatment for panic disorder. He expressed understanding that his care can be transferred to PCP if he prefers/his PCP agrees with this.  # Fatigue He reports non restorative sleep, and does snoring.  Although sleep evaluation is recommended, he is not interested at this time.   # Hypertension He has hypertension on today's evaluation.  He will see a new PCP.  He was advised to discuss this with his provider.     Plan Continue lexapro 20 mg daily (QTc 456 msec on 12/2021. IRBBB, HR 80) Next appointment: 5/5 at 11:30 - on imipramine for stress induced incontinence - on metoprolol 20 mg daily as needed for anxiety [email or web address]     The patient demonstrates the following risk factors for suicide: Chronic risk factors for suicide include: psychiatric disorder of anxiety . Acute risk factors for suicide include: N/A. Protective factors for this patient include: positive social support, coping skills, and hope for the future. Considering these  factors, the overall suicide risk at this point appears to be low. Patient is appropriate for outpatient follow up.       Collaboration of Care: Collaboration of Care: Other reviewed notes in Epic  Patient/Guardian was advised Release of Information must be obtained prior to any record release in order to collaborate their care with an outside provider. Patient/Guardian was advised if they have not already done so to contact the registration department to sign all necessary forms in order for [place] to release information regarding their care.   Consent: Patient/Guardian gives verbal consent for treatment and assignment of benefits for services provided during this visit. Patient/Guardian expressed understanding and agreed to proceed.    Neysa Hotter, MD 05/31/2023, 11:29 AM

## 2023-05-31 ENCOUNTER — Ambulatory Visit (INDEPENDENT_AMBULATORY_CARE_PROVIDER_SITE_OTHER): Payer: Medicare Other | Admitting: Psychiatry

## 2023-05-31 ENCOUNTER — Encounter: Payer: Self-pay | Admitting: Psychiatry

## 2023-05-31 VITALS — BP 157/69 | HR 93 | Temp 97.6°F | Ht 72.0 in | Wt 219.0 lb

## 2023-05-31 DIAGNOSIS — F410 Panic disorder [episodic paroxysmal anxiety] without agoraphobia: Secondary | ICD-10-CM

## 2023-05-31 NOTE — Addendum Note (Signed)
Addended by: Neysa Hotter on: 05/31/2023 12:43 PM   Modules accepted: Level of Service

## 2023-05-31 NOTE — Patient Instructions (Signed)
Continue lexapro 20 mg daily Next appointment: 5/5 at 11:30

## 2023-06-04 ENCOUNTER — Other Ambulatory Visit: Payer: Medicare Other

## 2023-06-04 DIAGNOSIS — Z8546 Personal history of malignant neoplasm of prostate: Secondary | ICD-10-CM

## 2023-06-05 LAB — PSA: Prostate Specific Ag, Serum: <0.1 ng/mL (ref 0.0–4.0)

## 2023-06-07 ENCOUNTER — Ambulatory Visit: Payer: Medicare Other | Admitting: Dermatology

## 2023-06-07 ENCOUNTER — Encounter: Payer: Self-pay | Admitting: Urology

## 2023-06-07 ENCOUNTER — Ambulatory Visit (INDEPENDENT_AMBULATORY_CARE_PROVIDER_SITE_OTHER): Payer: Medicare Other | Admitting: Urology

## 2023-06-07 VITALS — BP 144/79 | HR 94 | Ht 72.0 in | Wt 218.0 lb

## 2023-06-07 DIAGNOSIS — L578 Other skin changes due to chronic exposure to nonionizing radiation: Secondary | ICD-10-CM | POA: Diagnosis not present

## 2023-06-07 DIAGNOSIS — Z08 Encounter for follow-up examination after completed treatment for malignant neoplasm: Secondary | ICD-10-CM | POA: Diagnosis not present

## 2023-06-07 DIAGNOSIS — Z5111 Encounter for antineoplastic chemotherapy: Secondary | ICD-10-CM

## 2023-06-07 DIAGNOSIS — L820 Inflamed seborrheic keratosis: Secondary | ICD-10-CM | POA: Diagnosis not present

## 2023-06-07 DIAGNOSIS — Z8546 Personal history of malignant neoplasm of prostate: Secondary | ICD-10-CM

## 2023-06-07 DIAGNOSIS — Z79899 Other long term (current) drug therapy: Secondary | ICD-10-CM

## 2023-06-07 DIAGNOSIS — W908XXA Exposure to other nonionizing radiation, initial encounter: Secondary | ICD-10-CM

## 2023-06-07 DIAGNOSIS — Z7189 Other specified counseling: Secondary | ICD-10-CM

## 2023-06-07 DIAGNOSIS — L299 Pruritus, unspecified: Secondary | ICD-10-CM | POA: Diagnosis not present

## 2023-06-07 DIAGNOSIS — L570 Actinic keratosis: Secondary | ICD-10-CM

## 2023-06-07 DIAGNOSIS — L209 Atopic dermatitis, unspecified: Secondary | ICD-10-CM

## 2023-06-07 DIAGNOSIS — L2089 Other atopic dermatitis: Secondary | ICD-10-CM

## 2023-06-07 DIAGNOSIS — L814 Other melanin hyperpigmentation: Secondary | ICD-10-CM

## 2023-06-07 DIAGNOSIS — L821 Other seborrheic keratosis: Secondary | ICD-10-CM

## 2023-06-07 MED ORDER — TRIAMCINOLONE ACETONIDE 0.1 % EX CREA: TOPICAL_CREAM | CUTANEOUS | 1 refills | Status: DC

## 2023-06-07 NOTE — Progress Notes (Signed)
I, Richard Bean, acting as a scribe for Richard Altes, MD., have documented all relevant documentation on the behalf of Richard Altes, MD, as directed by Richard Altes, MD while in the presence of Richard Altes, MD.  06/07/2023 10:24 AM   [name] Nov 26, 1950 [id number]  Referring provider: Alfredia Ferguson, PA-C [address] [place],  [place] [address]  Chief Complaint  Patient presents with   Elevated PSA   Urologic history: 1.  History of prostate cancer [facility] [place]; undetectable PSA    2.  Erectile dysfunction Sildenafil prn  HPI: Richard Bean is a 72 y.o. male presents for annual follow-up.  Doing well since last visit No bothersome LUTS Denies dysuria, gross hematuria Denies flank, abdominal or pelvic pain Sildenafil 100 mg prn ED PSA 06/04/2023 remains undetectable at <0.1  PSA trend   Prostate Specific Ag, Serum  Latest Ref Rng 0.0 - 4.0 ng/mL  05/29/2019 <0.1   05/30/2020 <0.1   05/27/2021 <0.1   06/01/2022 <0.1   06/04/2023 <0.1      PMH: Past Medical History:  Diagnosis Date   Actinic keratosis    Anxiety    Cancer (HCC)    Depression    GERD (gastroesophageal reflux disease)    H/O prostate cancer    Heart murmur    Kidney disease    Liver disease    Panic disorder with agoraphobia     Surgical History: Past Surgical History:  Procedure Laterality Date   ANKLE SURGERY Right    COLONOSCOPY WITH PROPOFOL N/A 09/25/2021   Procedure: COLONOSCOPY WITH PROPOFOL;  Surgeon: Toney Reil, MD;  Location: [facility] ENDOSCOPY;  Service: Gastroenterology;  Laterality: N/A;   ESOPHAGOGASTRODUODENOSCOPY (EGD) WITH PROPOFOL N/A 06/26/2020   Procedure: ESOPHAGOGASTRODUODENOSCOPY (EGD) WITH PROPOFOL;  Surgeon: Toney Reil, MD;  Location: [facility] ENDOSCOPY;  Service: Gastroenterology;  Laterality: N/A;   ESOPHAGOGASTRODUODENOSCOPY (EGD) WITH PROPOFOL N/A 10/02/2020   Procedure: ESOPHAGOGASTRODUODENOSCOPY (EGD) WITH  PROPOFOL;  Surgeon: Toney Reil, MD;  Location: [facility] ENDOSCOPY;  Service: Gastroenterology;  Laterality: N/A;   PROSTATE SURGERY      Home Medications:  Allergies as of 06/07/2023       Reactions   Chlorpheniramine-phenylephrine Other (See Comments), Nausea Only   Actifed Makes cold symptoms worse Other reaction(s): Unknown Actifed Makes cold symptoms worse Other reaction(s): Unknown        Medication List        Accurate as of June 07, 2023 10:24 AM. If you have any questions, ask your nurse or doctor.          amLODipine 5 MG tablet Commonly known as: NORVASC Take 1 tablet (5 mg total) by mouth daily.   atorvastatin 20 MG tablet Commonly known as: LIPITOR TAKE 1 TABLET DAILY   enalapril 5 MG tablet Commonly known as: VASOTEC Take 1 tablet (5 mg total) by mouth daily.   escitalopram 20 MG tablet Commonly known as: LEXAPRO TAKE 1 TABLET DAILY   ezetimibe 10 MG tablet Commonly known as: ZETIA Take 1 tablet (10 mg total) by mouth daily.   imipramine 25 MG tablet Commonly known as: TOFRANIL TAKE 1 TABLET AT BEDTIME   omeprazole 40 MG capsule Commonly known as: PRILOSEC Take by mouth once.   propranolol 10 MG tablet Commonly known as: INDERAL Take 1 tablet (10 mg total) by mouth daily as needed.   sildenafil 20 MG tablet Commonly known as: REVATIO Take 1 tablet (  20 mg total) by mouth as directed.        Allergies:  Allergies  Allergen Reactions   Chlorpheniramine-Phenylephrine Other (See Comments) and Nausea Only    Actifed Makes cold symptoms worse Other reaction(s): Unknown Actifed Makes cold symptoms worse Other reaction(s): Unknown     Family History: Family History  Problem Relation Age of Onset   COPD Mother    Prostate cancer Father    Cancer - Lung Father    Depression Sister    Stomach cancer Sister    Alcohol abuse Brother    Heart block Brother    Breast cancer Sister    Prostate cancer Brother    Skin  cancer Brother     Social History:  reports that he has been smoking cigarettes. He started smoking about 52 years ago. He has a 52.1 pack-year smoking history. He has never used smokeless tobacco. He reports current alcohol use of about 16.0 standard drinks of alcohol per week. He reports that he does not use drugs.   Physical Exam: BP (!) 144/79   Pulse 94   Ht 6' (1.829 m)   Wt 218 lb (98.9 kg)   BMI 29.57 kg/m   Constitutional:  Alert, No acute distress. HEENT: [place] AT Respiratory: Normal respiratory effort, no increased work of breathing. Psychiatric: Normal mood and affect.   Assessment & Plan:    1.  History of prostate cancer PSA remains undetectable He will follow up as needed if PCP will check his PSA annually.   I have reviewed the above documentation for accuracy and completeness, and I agree with the above.   Richard Altes, MD  [facility] [address] [place], [place] [address] [phone]

## 2023-06-07 NOTE — Progress Notes (Signed)
Follow-Up Visit   Subjective  Richard Bean is a 72 y.o. male who presents for the following: Ak follow up, check sun exposed areas today for more precancerous skin lesions.  The patient has spots, moles and lesions to be evaluated, some may be new or changing.  The following portions of the chart were reviewed this encounter and updated as appropriate: medications, allergies, medical history  Review of Systems:  No other skin or systemic complaints except as noted in HPI or Assessment and Plan.  Objective  Well appearing patient in no apparent distress; mood and affect are within normal limits.   A focused examination was performed of the following areas: the face, scalp, arms, and hands   Relevant exam findings are noted in the Assessment and Plan.  Scalp x 15 (15) Erythematous thin papules/macules with gritty scale.   L sup sideburn area x 2 (2) Erythematous keratotic or waxy stuck-on papule or plaque.    Assessment & Plan   ACTINIC DAMAGE WITH PRECANCEROUS ACTINIC KERATOSES Counseling for Topical Chemotherapy Management: Patient exhibits: - Severe, confluent actinic changes with pre-cancerous actinic keratoses that is secondary to cumulative UV radiation exposure over time - Condition that is severe; chronic, not at goal. - diffuse scaly erythematous macules and papules with underlying dyspigmentation - Discussed Prescription "Field Treatment" topical Chemotherapy for Severe, Chronic Confluent Actinic Changes with Pre-Cancerous Actinic Keratoses Field treatment involves treatment of an entire area of skin that has confluent Actinic Changes (Sun/ Ultraviolet light damage) and PreCancerous Actinic Keratoses by method of PhotoDynamic Therapy (PDT) and/or prescription Topical Chemotherapy agents such as 5-fluorouracil, 5-fluorouracil/calcipotriene, and/or imiquimod.  The purpose is to decrease the number of clinically evident and subclinical PreCancerous lesions to prevent  progression to development of skin cancer by chemically destroying early precancer changes that may or may not be visible.  It has been shown to reduce the risk of developing skin cancer in the treated area. As a result of treatment, redness, scaling, crusting, and open sores may occur during treatment course. One or more than one of these methods may be used and may have to be used several times to control, suppress and eliminate the PreCancerous changes. Discussed treatment course, expected reaction, and possible side effects. - Recommend daily broad spectrum sunscreen SPF 30+ to sun-exposed areas, reapply every 2 hours as needed.  - Staying in the shade or wearing long sleeves, sun glasses (UVA+UVB protection) and wide brim hats (4-inch brim around the entire circumference of the hat) are also recommended. - Call for new or changing lesions.  SEBORRHEIC KERATOSIS - Stuck-on, waxy, tan-brown papules and/or plaques  - Benign-appearing - Discussed benign etiology and prognosis. - Observe - Call for any changes  LENTIGINES Exam: scattered tan macules Due to sun exposure Treatment Plan: Benign-appearing, observe. Recommend daily broad spectrum sunscreen SPF 30+ to sun-exposed areas, reapply every 2 hours as needed.  Call for any change  AK (actinic keratosis) (15) Scalp x 15  Actinic keratoses are precancerous spots that appear secondary to cumulative UV radiation exposure/sun exposure over time. They are chronic with expected duration over 1 year. A portion of actinic keratoses will progress to squamous cell carcinoma of the skin. It is not possible to reliably predict which spots will progress to skin cancer and so treatment is recommended to prevent development of skin cancer.  Recommend daily broad spectrum sunscreen SPF 30+ to sun-exposed areas, reapply every 2 hours as needed.  Recommend staying in the shade or wearing long sleeves,  sun glasses (UVA+UVB protection) and wide brim hats  (4-inch brim around the entire circumference of the hat). Call for new or changing lesions.  Destruction of lesion - Scalp x 15 (15) Complexity: simple   Destruction method: cryotherapy   Informed consent: discussed and consent obtained   Timeout:  patient name, date of birth, surgical site, and procedure verified Lesion destroyed using liquid nitrogen: Yes   Region frozen until ice ball extended beyond lesion: Yes   Outcome: patient tolerated procedure well with no complications   Post-procedure details: wound care instructions given    Inflamed seborrheic keratosis (2) L sup sideburn area x 2  Symptomatic, irritating, patient would like treated.  Destruction of lesion - L sup sideburn area x 2 (2) Complexity: simple   Destruction method: cryotherapy   Informed consent: discussed and consent obtained   Timeout:  patient name, date of birth, surgical site, and procedure verified Lesion destroyed using liquid nitrogen: Yes   Region frozen until ice ball extended beyond lesion: Yes   Outcome: patient tolerated procedure well with no complications   Post-procedure details: wound care instructions given    Atopic dermatitis with pruritus 50% BSA with small crusts, excoriations, and generalized xerosis  Atopic dermatitis (eczema) is a chronic, relapsing, pruritic condition that can significantly affect quality of life. It is often associated with allergic rhinitis and/or asthma and can require treatment with topical medications, phototherapy, or in severe cases biologic injectable medication (Dupixent; Adbry) or Oral JAK inhibitors.  Treatment Plan: Start TMC 0.1% cream to aa's BID x 2 weeks. Then decrease to BID x 5 days per week. Topical steroids (such as triamcinolone, fluocinolone, fluocinonide, mometasone, clobetasol, halobetasol, betamethasone, hydrocortisone) can cause thinning and lightening of the skin if they are used for too long in the same area. Your physician has selected  the right strength medicine for your problem and area affected on the body. Please use your medication only as directed by your physician to prevent side effects.  Pt to contact office via MyChart in 4 weeks to report condition. Consider Dupixent at that time if not improved.   Return in about 6 months (around 12/05/2023) for TBSE.  Maylene Roes, CMA, am acting as scribe for Armida Sans, MD .  Documentation: I have reviewed the above documentation for accuracy and completeness, and I agree with the above.  Armida Sans, MD

## 2023-06-07 NOTE — Patient Instructions (Addendum)
Atopic dermatitis (eczema) is a chronic, relapsing, pruritic condition that can significantly affect quality of life. It is often associated with allergic rhinitis and/or asthma and can require treatment with topical medications, phototherapy, or in severe cases biologic injectable medication (Dupixent; Adbry) or Oral JAK inhibitors.  Due to recent changes in healthcare laws, you may see results of your pathology and/or laboratory studies on MyChart before the doctors have had a chance to review them. We understand that in some cases there may be results that are confusing or concerning to you. Please understand that not all results are received at the same time and often the doctors may need to interpret multiple results in order to provide you with the best plan of care or course of treatment. Therefore, we ask that you please give [place] 2 business days to thoroughly review all your results before contacting the office for clarification. Should we see a critical lab result, you will be contacted sooner.   If You Need Anything After Your Visit  If you have any questions or concerns for your doctor, please call our main line at [phone] and press option 4 to reach your doctor's medical assistant. If no one answers, please leave a voicemail as directed and we will return your call as soon as possible. Messages left after 4 pm will be answered the following business day.   You may also send [place] a message via MyChart. We typically respond to MyChart messages within 1-2 business days.  For prescription refills, please ask your pharmacy to contact our office. Our fax number is [phone].  If you have an urgent issue when the clinic is closed that cannot wait until the next business day, you can page your doctor at the number below.    Please note that while we do our best to be available for urgent issues outside of office hours, we are not available 24/7.   If you have an urgent issue and are unable  to reach [place], you may choose to seek medical care at your doctor's office, retail clinic, urgent care center, or emergency room.  If you have a medical emergency, please immediately call 911 or go to the emergency department.  Pager Numbers  - Dr. Gwen Pounds: [phone]  - Dr. Roseanne Reno: [phone]  - Dr. Katrinka Blazing: [phone]   In the event of inclement weather, please call our main line at [phone] for an update on the status of any delays or closures.  Dermatology Medication Tips: Please keep the boxes that topical medications come in in order to help keep track of the instructions about where and how to use these. Pharmacies typically print the medication instructions only on the boxes and not directly on the medication tubes.   If your medication is too expensive, please contact our office at [phone] option 4 or send [place] a message through MyChart.   We are unable to tell what your co-pay for medications will be in advance as this is different depending on your insurance coverage. However, we may be able to find a substitute medication at lower cost or fill out paperwork to get insurance to cover a needed medication.   If a prior authorization is required to get your medication covered by your insurance company, please allow [place] 1-2 business days to complete this process.  Drug prices often vary depending on where the prescription is filled and some pharmacies may offer cheaper prices.  The website www.goodrx.com contains coupons for medications through different pharmacies. The prices  here do not account for what the cost may be with help from insurance (it may be cheaper with your insurance), but the website can give you the price if you did not use any insurance.  - You can print the associated coupon and take it with your prescription to the pharmacy.  - You may also stop by our office during regular business hours and pick up a GoodRx coupon card.  - If you need your  prescription sent electronically to a different pharmacy, notify our office through [facility] or by phone at [phone] option 4.     Si Usted Necesita Algo Despus de Su Visita  Tambin puede enviarnos un mensaje a travs de Clinical cytogeneticist. Por lo general respondemos a los mensajes de MyChart en el transcurso de 1 a 2 das hbiles.  Para renovar recetas, por favor pida a su farmacia que se ponga en contacto con nuestra oficina. Annie Sable de fax es [place] [phone].  Si tiene un asunto urgente cuando la clnica est cerrada y que no puede esperar hasta el siguiente da hbil, puede llamar/localizar a su doctor(a) al nmero que aparece a continuacin.   Por favor, tenga en cuenta que aunque hacemos todo lo posible para estar disponibles para asuntos urgentes fuera del horario de [place], no estamos disponibles las 24 horas del da, los 7 [address]  [address] de la [place].   Si tiene un problema urgente y no puede comunicarse con nosotros, puede optar por buscar atencin mdica  en el consultorio de su doctor(a), en una clnica privada, en un centro de atencin urgente o en una sala de emergencias.  Si tiene Engineer, drilling, por favor llame inmediatamente al 911 o vaya a la sala de emergencias.  Nmeros de bper  - Dr. Gwen Pounds: [phone]  - Dra. Roseanne Reno: [phone]  - Dr. Katrinka Blazing: [phone]   En caso de inclemencias del tiempo, por favor llame a Lacy Duverney principal al [phone] para una actualizacin sobre el [place] de cualquier retraso o cierre.  Consejos para la medicacin en dermatologa: Por favor, guarde las cajas en las que vienen los medicamentos de uso tpico para ayudarle a seguir las instrucciones sobre dnde y cmo usarlos. Las farmacias generalmente imprimen las instrucciones del medicamento slo en las cajas y no directamente en los tubos del [place].   Si su medicamento es muy caro, por favor, pngase en contacto con Rolm Gala llamando al  [phone] y presione la opcin 4 o envenos un mensaje a travs de Clinical cytogeneticist.   No podemos decirle cul ser su copago por los medicamentos por adelantado ya que esto es diferente dependiendo de la cobertura de su seguro. Sin embargo, es posible que podamos encontrar un medicamento sustituto a Audiological scientist un formulario para que el seguro cubra el medicamento que se considera necesario.   Si se requiere una autorizacin previa para que su compaa de seguros [place] su medicamento, por favor permtanos de 1 a 2 das hbiles para completar [address]  Los precios de los medicamentos varan con frecuencia dependiendo del Environmental consultant de dnde se surte la receta y alguna farmacias pueden ofrecer precios ms baratos.  El sitio web www.goodrx.com tiene cupones para medicamentos de Health and safety inspector. Los precios aqu no tienen en cuenta lo que podra costar con la ayuda del seguro (puede ser ms barato con su seguro), pero el sitio web puede darle el precio si no utiliz Tourist information centre manager.  - Puede imprimir el cupn correspondiente y llevarlo con su receta a  la farmacia.  - Tambin puede pasar por nuestra oficina durante el horario de atencin regular y Education officer, museum una tarjeta de cupones de GoodRx.  - Si necesita que su receta se enve electrnicamente a una farmacia diferente, informe a nuestra oficina a travs de MyChart de  o por telfono llamando al [phone] y presione la opcin 4.

## 2023-06-09 ENCOUNTER — Encounter: Payer: Self-pay | Admitting: Urology

## 2023-06-10 ENCOUNTER — Ambulatory Visit: Payer: Medicare Other | Admitting: Dermatology

## 2023-06-14 ENCOUNTER — Encounter: Payer: Self-pay | Admitting: Dermatology

## 2023-06-16 ENCOUNTER — Encounter: Payer: Self-pay | Admitting: Gastroenterology

## 2023-06-16 ENCOUNTER — Ambulatory Visit (INDEPENDENT_AMBULATORY_CARE_PROVIDER_SITE_OTHER): Payer: Medicare Other | Admitting: Gastroenterology

## 2023-06-16 VITALS — BP 150/70 | HR 90 | Temp 98.0°F | Ht 72.0 in | Wt 220.5 lb

## 2023-06-16 DIAGNOSIS — K7460 Unspecified cirrhosis of liver: Secondary | ICD-10-CM

## 2023-06-16 DIAGNOSIS — F1090 Alcohol use, unspecified, uncomplicated: Secondary | ICD-10-CM

## 2023-06-16 DIAGNOSIS — K760 Fatty (change of) liver, not elsewhere classified: Secondary | ICD-10-CM

## 2023-06-16 DIAGNOSIS — K7581 Nonalcoholic steatohepatitis (NASH): Secondary | ICD-10-CM | POA: Diagnosis not present

## 2023-06-16 DIAGNOSIS — Z720 Tobacco use: Secondary | ICD-10-CM

## 2023-06-16 DIAGNOSIS — R772 Abnormality of alphafetoprotein: Secondary | ICD-10-CM

## 2023-06-16 DIAGNOSIS — K2270 Barrett's esophagus without dysplasia: Secondary | ICD-10-CM | POA: Diagnosis not present

## 2023-06-16 MED ORDER — OMEPRAZOLE 40 MG PO CPDR: 40.0000 mg | DELAYED_RELEASE_CAPSULE | Freq: Two times a day (BID) | ORAL | 1 refills | Status: AC

## 2023-06-16 NOTE — Patient Instructions (Addendum)
I got Your RUQ ultrasound scheduled for 06/21/2023 arrive to the [facility] at 9:15am for a 9:30am scan. Nothing to eat or drink after midnight. If you need to reschedule please call [phone] option 3 and then option 2.  Increase your omeprazole 40mg  to twice a day before meals. [facility]

## 2023-06-16 NOTE — Progress Notes (Signed)
Richard Repress, MD [address]  [address]  [place], [place] [address]  Main: [phone]  Fax: [phone]    Gastroenterology Consultation  Referring Provider:     Sherlyn Hay, DO Primary Care Physician:  Sherlyn Hay, DO Primary Gastroenterologist:  Dr. Arlyss Bean Reason for Consultation: cirrhosis of liver, long segment Barrett's esophagus        HPI:   Richard Bean is a 72 y.o. male referred by Dr. Sherlyn Hay, DO  for consultation & management of possible seizures evaluated.  Patient with history of obesity, chronic kidney disease, hypertension is seen in consultation for possible cirrhosis of liver.  This was identified on CT chest performed for lung cancer screening.  Therefore, referred to GI by Dr. Wynelle Link.  Patient does not have any signs or symptoms to suggest chronic liver disease.  He does admit to drinking 3-4 drinks of alcohol once or twice per week.  He does smoke cigarettes, 1 pack/day.  His labs revealed mild elevated ALT in 09/2019.  Repeat LFTs were normal.  No evidence of thrombocytopenia, anemia.  Follow-up visit 07/03/2020 Patient underwent upper endoscopy, found to have long segment Barrett's esophagus without evidence of dysplasia.  He also had erosive esophagitis.  I started him on omeprazole 40 mg twice daily, patient did not receive his medication from [facility] yet.  Patient denies any symptoms today.  He has made significant changes in his eating habits, significantly cut back on red meat, high-calorie foods.  He does not have any concerns today.  He continues to smoke tobacco  Follow-up visit 06/03/2022 Patient is here for follow-up of compensated cirrhosis of liver, follow-up of chronic GERD and long segment Barrett's esophagus.  Patient is currently taking omeprazole 40 mg daily with good symptoms well under control.  He does not have any abdominal swelling, distention, swelling of legs, bleeding gums or epistaxis or  rectal bleeding.  Follow-up visit 06/16/2023 [name] is here for follow-up of cirrhosis of liver and Barrett's esophagus.  He lives alone and for last 3 months, he started drinking alcohol, admits to drinking about 3 glasses of bourbon at night to keep him maintain asleep.  He continues to smoke cigarettes.  He reports taking omeprazole 40 mg once a day.  He denies any swelling of legs, abdominal distention, melena, rectal bleeding, abdominal pain, nausea or vomiting.  He does not have any concerns today.  He denies any acid reflux symptoms, difficulty swallowing  NSAIDs: None  Antiplts/Anticoagulants/Anti thrombotics: None  GI Procedures:   Underwent colonoscopy in 10/22/2017, transverse colon polyp was removed, pathology consistent with tubular adenoma   Upper endoscopy 06/26/2020 - Mucosal changes in the duodenum. Biopsied. - Normal stomach. - Esophageal mucosal changes consistent with long-segment Barrett's esophagus. Biopsied. - LA Grade C reflux esophagitis with no bleeding.  DIAGNOSIS:  A. DUODENUM, SECOND PORTION; COLD BIOPSY:  - MILD ACTIVE DUODENITIS.  - NEGATIVE FOR DYSPLASIA AND MALIGNANCY.   B. ESOPHAGUS, 43 CM; COLD BIOPSY:  - GASTRIC CARDIAC TYPE MUCOSA WITH MILD NON-SPECIFIC CHRONIC GASTRITIS.  - SQUAMOUS MUCOSA IS NOT PRESENT FOR EVALUATION.  - NEGATIVE FOR INTESTINAL METAPLASIA, DYSPLASIA, AND MALIGNANCY.   C. ESOPHAGUS, 41 CM; COLD BIOPSY:  - GLANDULAR COLUMNAR MUCOSA WITH INTESTINAL METAPLASIA, CONSISTENT WITH  BARRETT'S ESOPHAGUS GIVEN ENDOSCOPIC FINDINGS.  - SEPARATE SMALL FRAGMENT OF UNREMARKABLE SQUAMOUS MUCOSA.  - NEGATIVE FOR DYSPLASIA AND MALIGNANCY.  - DEEPER SECTIONS EXAMINED.   D. ESOPHAGUS, 39 CM; COLD  BIOPSY:  - SQUAMOCOLUMNAR MUCOSA WITH INTESTINAL METAPLASIA, CONSISTENT WITH  BARRETT'S ESOPHAGUS GIVEN ENDOSCOPIC FINDINGS.  - NEGATIVE FOR DYSPLASIA AND MALIGNANCY.   Colonoscopy 09/25/2021 - One diminutive polyp in the descending colon,  removed with a cold biopsy forceps. Resected and retrieved. - The distal rectum and anal verge are normal on retroflexion view. - Stool in the entire examined colon. - Non-bleeding external and internal hemorrhoids. DIAGNOSIS:  A. COLON POLYP, DESCENDING; COLD BIOPSY:  - HYPERPLASTIC POLYP.  - NEGATIVE FOR DYSPLASIA AND MALIGNANCY.   Upper endoscopy 10/02/2020 - Normal duodenal bulb and second portion of the duodenum. - Normal stomach. - Esophageal mucosal changes secondary to established long-segment Barrett's disease. Biopsied.  DIAGNOSIS:  A. ESOPHAGUS, 41 CM; COLD BIOPSY:  - BARRETT'S ESOPHAGUS.  - NEGATIVE FOR DYSPLASIA AND MALIGNANCY.   B. ESOPHAGUS, 40 CM; COLD BIOPSY:  - BARRETT'S ESOPHAGUS.  - NEGATIVE FOR DYSPLASIA AND MALIGNANCY.   C. ESOPHAGUS, 39 CM; COLD BIOPSY:  - BARRETT'S ESOPHAGUS.  - NEGATIVE FOR DYSPLASIA AND MALIGNANCY.   D. ESOPHAGUS, 38 CM; COLD BIOPSY:  - BARRETT'S ESOPHAGUS.  - NEGATIVE FOR DYSPLASIA AND MALIGNANCY.   Patient is a retired Sport and exercise psychologist.  He lives alone  Past Medical History:  Diagnosis Date   Actinic keratosis    Anxiety    Cancer (HCC)    Depression    GERD (gastroesophageal reflux disease)    H/O prostate cancer    Heart murmur    Kidney disease    Liver disease    Panic disorder with agoraphobia     Past Surgical History:  Procedure Laterality Date   ANKLE SURGERY Right    COLONOSCOPY WITH PROPOFOL N/A 09/25/2021   Procedure: COLONOSCOPY WITH PROPOFOL;  Surgeon: Toney Reil, MD;  Location: [facility] ENDOSCOPY;  Service: Gastroenterology;  Laterality: N/A;   ESOPHAGOGASTRODUODENOSCOPY (EGD) WITH PROPOFOL N/A 06/26/2020   Procedure: ESOPHAGOGASTRODUODENOSCOPY (EGD) WITH PROPOFOL;  Surgeon: Toney Reil, MD;  Location: [facility] ENDOSCOPY;  Service: Gastroenterology;  Laterality: N/A;   ESOPHAGOGASTRODUODENOSCOPY (EGD) WITH PROPOFOL N/A 10/02/2020   Procedure: ESOPHAGOGASTRODUODENOSCOPY (EGD) WITH PROPOFOL;  Surgeon:  Toney Reil, MD;  Location: [facility] ENDOSCOPY;  Service: Gastroenterology;  Laterality: N/A;   PROSTATE SURGERY      Current Outpatient Medications:    amLODipine (NORVASC) 5 MG tablet, Take 1 tablet (5 mg total) by mouth daily., Disp: 90 tablet, Rfl: 3   atorvastatin (LIPITOR) 20 MG tablet, TAKE 1 TABLET DAILY, Disp: 90 tablet, Rfl: 3   enalapril (VASOTEC) 5 MG tablet, Take 1 tablet (5 mg total) by mouth daily., Disp: 90 tablet, Rfl: 3   escitalopram (LEXAPRO) 20 MG tablet, TAKE 1 TABLET DAILY, Disp: 90 tablet, Rfl: 3   ezetimibe (ZETIA) 10 MG tablet, Take 1 tablet (10 mg total) by mouth daily., Disp: 90 tablet, Rfl: 3   imipramine (TOFRANIL) 25 MG tablet, TAKE 1 TABLET AT BEDTIME, Disp: 90 tablet, Rfl: 3   propranolol (INDERAL) 10 MG tablet, Take 1 tablet (10 mg total) by mouth daily as needed., Disp: 90 tablet, Rfl: 3   sildenafil (REVATIO) 20 MG tablet, Take 1 tablet (20 mg total) by mouth as directed., Disp: 90 tablet, Rfl: 3   triamcinolone cream (KENALOG) 0.1 %, For eczema apply to aa's rash BID x 2 weeks. Then decrease to BID 5d/wk PRN. Avoid applying to face, groin, and axilla. Use as directed. Long-term use can cause thinning of the skin., Disp: 453.6 g, Rfl: 1   omeprazole (PRILOSEC) 40 MG capsule, Take 1  capsule (40 mg total) by mouth 2 (two) times daily before a meal., Disp: 180 capsule, Rfl: 1   Family History  Problem Relation Age of Onset   COPD Mother    Prostate cancer Father    Cancer - Lung Father    Depression Sister    Stomach cancer Sister    Alcohol abuse Brother    Heart block Brother    Breast cancer Sister    Prostate cancer Brother    Skin cancer Brother      Social History   Tobacco Use   Smoking status: Every Day    Current packs/day: 1.00    Average packs/day: 1 pack/day for 52.1 years (52.1 ttl pk-yrs)    Types: Cigarettes    Start date: 05/06/1971   Smokeless tobacco: Never  Vaping Use   Vaping status: Never Used  Substance Use Topics    Alcohol use: Yes    Alcohol/week: 16.0 standard drinks of alcohol    Types: 6 Cans of beer, 10 Shots of liquor per week    Comment: in 1 to 2 times a week    Drug use: No    Allergies as of 06/16/2023 - Review Complete 06/14/2023  Allergen Reaction Noted   Chlorpheniramine-phenylephrine Other (See Comments) and Nausea Only 06/07/2013    Review of Systems:    All systems reviewed and negative except where noted in HPI.   Physical Exam:  BP (!) 150/70 (BP Location: Left Arm, Patient Position: Sitting, Cuff Size: Normal)   Pulse 90   Temp 98 F (36.7 C) (Oral)   Ht 6' (1.829 m)   Wt 220 lb 8 oz (100 kg)   BMI 29.91 kg/m  No LMP for male patient.  General:   Alert,  Well-developed, well-nourished, pleasant and cooperative in NAD Head:  Normocephalic and atraumatic. Eyes:  Sclera clear, no icterus.   Conjunctiva pink. Ears:  Normal auditory acuity. Nose:  No deformity, discharge, or lesions. Mouth:  No deformity or lesions,oropharynx pink & moist. Neck:  Supple; no masses or thyromegaly. Lungs:  Respirations even and unlabored.  Clear throughout to auscultation.   No wheezes, crackles, or rhonchi. No acute distress. Heart:  Regular rate and rhythm; no murmurs, clicks, rubs, or gallops. Abdomen:  Normal bowel sounds. Soft, obese, non-tender and non-distended without masses, hepatosplenomegaly or hernias noted.  No guarding or rebound tenderness.   Rectal: Not performed Msk:  Symmetrical without gross deformities. Good, equal movement & strength bilaterally. Pulses:  Normal pulses noted. Extremities:  No clubbing or edema.  No cyanosis. Neurologic:  Alert and oriented x3;  grossly normal neurologically. Skin: Spider nevi on upper anterior chest wall, intact without significant lesions or rashes. No jaundice. Lymph Nodes:  No significant cervical adenopathy. Psych:  Alert and cooperative. Normal mood and affect.  Imaging Studies: Reviewed  Assessment and Plan:   AUNDRAY BATZ is a 72 y.o. pleasant Caucasian male with obesity, hypertension, CKD is seen in for follow-up of cirrhosis of liver.  Hepatitis B surface antigen negative, HCV antibody negative, hepatitis B core antibody negative.  Secondary liver disease work-up of cirrhosis is negative  Compensated cirrhosis secondary to fatty liver Secondary liver disease work-up is negative EGD did not reveal varices Ultrasound liver and Dopplers did not reveal any portal vein thrombosis, no evidence of liver lesions, AFP normal Repeat ultrasound every 6 months, check AFP levels Discussed about lifestyle modification including diet and exercise, weight loss Strongly advised to quit drinking alcohol Discussed about  smoking cessation, patient is not ready to quit yet Recheck labs today  Long segment Barrett's esophagus without dysplasia (38cm to 41cm) Given going alcohol use and tobacco use recommended to increase omeprazole 40 mg to twice daily before meals indefinitely Antireflux lifestyle Recommend surveillance EGD in 09/2023  Follow up in 6 months  Richard Repress, MD

## 2023-06-17 ENCOUNTER — Telehealth: Payer: Self-pay

## 2023-06-17 LAB — CBC WITH DIFFERENTIAL/PLATELET
Basophils Absolute: 0.1 10*3/uL (ref 0.0–0.2)
Basos: 1 %
EOS (ABSOLUTE): 0.2 10*3/uL (ref 0.0–0.4)
Eos: 2 %
Hematocrit: 45.7 % (ref 37.5–51.0)
Hemoglobin: 15.8 g/dL (ref 13.0–17.7)
Immature Grans (Abs): 0.1 10*3/uL (ref 0.0–0.1)
Immature Granulocytes: 1 %
Lymphocytes Absolute: 1.2 10*3/uL (ref 0.7–3.1)
Lymphs: 12 %
MCH: 32 pg (ref 26.6–33.0)
MCHC: 34.6 g/dL (ref 31.5–35.7)
MCV: 93 fL (ref 79–97)
Monocytes Absolute: 0.7 10*3/uL (ref 0.1–0.9)
Monocytes: 8 %
Neutrophils Absolute: 7.4 10*3/uL — ABNORMAL HIGH (ref 1.4–7.0)
Neutrophils: 76 %
Platelets: 292 10*3/uL (ref 150–450)
RBC: 4.93 x10E6/uL (ref 4.14–5.80)
RDW: 11.9 % (ref 11.6–15.4)
WBC: 9.6 10*3/uL (ref 3.4–10.8)

## 2023-06-17 LAB — PROTIME-INR
INR: 1 (ref 0.9–1.2)
Prothrombin Time: 11.5 s (ref 9.1–12.0)

## 2023-06-17 LAB — AFP TUMOR MARKER: AFP, Serum, Tumor Marker: 7.3 ng/mL (ref 0.0–8.4)

## 2023-06-17 NOTE — Telephone Encounter (Signed)
Called and left a message for call back  

## 2023-06-17 NOTE — Telephone Encounter (Signed)
-----   Message from [facility] sent at 06/17/2023 10:35 AM EST ----- Lab results came back normal which is good news  RV

## 2023-06-18 NOTE — Telephone Encounter (Signed)
Called and left a message for call back  

## 2023-06-21 ENCOUNTER — Telehealth: Payer: Self-pay

## 2023-06-21 ENCOUNTER — Ambulatory Visit
Admission: RE | Admit: 2023-06-21 | Discharge: 2023-06-21 | Disposition: A | Discharge status: Home / Self Care | Payer: Medicare Other | Source: Ambulatory Visit | Attending: Gastroenterology | Admitting: Gastroenterology

## 2023-06-21 DIAGNOSIS — K7460 Unspecified cirrhosis of liver: Secondary | ICD-10-CM | POA: Diagnosis not present

## 2023-06-21 DIAGNOSIS — K7581 Nonalcoholic steatohepatitis (NASH): Secondary | ICD-10-CM | POA: Diagnosis not present

## 2023-06-21 NOTE — Telephone Encounter (Signed)
-----   Message from [facility] sent at 06/21/2023  4:13 PM EST ----- Ultrasound of the liver revealed fatty liver only.  No evidence of cirrhosis which is good news  RV

## 2023-06-21 NOTE — Telephone Encounter (Signed)
Mailed letter to patient

## 2023-06-21 NOTE — Telephone Encounter (Signed)
Called and left a  message for call back. Unable to reach patient do you want me to send a letter

## 2023-06-21 NOTE — Telephone Encounter (Signed)
Called and left a message for called back

## 2023-06-22 NOTE — Telephone Encounter (Signed)
Called and left a message for call back

## 2023-06-23 NOTE — Telephone Encounter (Signed)
Called and left a message for call back mailed letter to patient with results

## 2023-07-05 ENCOUNTER — Other Ambulatory Visit: Payer: Self-pay | Admitting: Urology

## 2023-07-05 DIAGNOSIS — F331 Major depressive disorder, recurrent, moderate: Secondary | ICD-10-CM

## 2023-07-15 ENCOUNTER — Encounter: Payer: Self-pay | Admitting: Dermatology

## 2023-07-15 ENCOUNTER — Ambulatory Visit: Payer: Medicare Other | Admitting: Dermatology

## 2023-07-15 DIAGNOSIS — L209 Atopic dermatitis, unspecified: Secondary | ICD-10-CM

## 2023-07-15 DIAGNOSIS — Z7189 Other specified counseling: Secondary | ICD-10-CM | POA: Diagnosis not present

## 2023-07-15 DIAGNOSIS — L2081 Atopic neurodermatitis: Secondary | ICD-10-CM

## 2023-07-15 DIAGNOSIS — L299 Pruritus, unspecified: Secondary | ICD-10-CM

## 2023-07-15 DIAGNOSIS — Z79899 Other long term (current) drug therapy: Secondary | ICD-10-CM

## 2023-07-15 MED ORDER — DUPILUMAB 300 MG/2ML ~~LOC~~ SOSY
600.0000 mg | PREFILLED_SYRINGE | Freq: Once | SUBCUTANEOUS | Status: AC
  Administered 2023-07-15: 600 mg via SUBCUTANEOUS

## 2023-07-15 NOTE — Progress Notes (Signed)
   Follow-Up Visit   Subjective  Richard Bean is a 72 y.o. male who presents for the following: AD trunk, extremities, 7 wk f/u, TMC 0.1% cr improved but not clear The patient has spots, moles and lesions to be evaluated, some may be new or changing and the patient may have concern these could be cancer.   The following portions of the chart were reviewed this encounter and updated as appropriate: medications, allergies, medical history  Review of Systems:  No other skin or systemic complaints except as noted in HPI or Assessment and Plan.  Objective  Well appearing patient in no apparent distress; mood and affect are within normal limits.   A focused examination was performed of the following areas: Trunk, arms, face  Relevant exam findings are noted in the Assessment and Plan.    Assessment & Plan   ATOPIC DERMATITIS with PRURITIS Trunk, extremities  Exam: Scaly pink papules with small crusts, excoriations, and generalized xerosis  50% BSA  Chronic and persistent condition with duration or expected duration over one year. Condition is symptomatic / bothersome to patient. Not to goal.   Atopic dermatitis (eczema) is a chronic, relapsing, pruritic condition that can significantly affect quality of life. It is often associated with allergic rhinitis and/or asthma and can require treatment with topical medications, phototherapy, or in severe cases biologic injectable medication (Dupixent; Adbry) or Oral JAK inhibitors.  Treatment Plan: Start Dupixent 300mg /37ml sq injections q 2wks Sample Dupixent 300mg /57ml sq injections today to L and R shouder Lot [id number] exp 02/2025 Cont TMC 0.1% cr qd up to 5d/wk aa body, avoid f/g/a  Recommend gentle skin care.   Dupilumab (Dupixent) is a treatment given by injection for adults and children with moderate-to-severe atopic dermatitis. Goal is control of skin condition, not cure. It is given as 2 injections at the first dose followed by 1  injection ever 2 weeks thereafter.  Young children are dosed monthly.  Potential side effects include allergic reaction, herpes infections, injection site reactions and conjunctivitis (inflammation of the eyes).  The use of Dupixent requires long term medication management, including periodic office visits.  Topical steroids (such as triamcinolone, fluocinolone, fluocinonide, mometasone, clobetasol, halobetasol, betamethasone, hydrocortisone) can cause thinning and lightening of the skin if they are used for too long in the same area. Your physician has selected the right strength medicine for your problem and area affected on the body. Please use your medication only as directed by your physician to prevent side effects.     ATOPIC NEURODERMATITIS   Related Medications dupilumab (DUPIXENT) prefilled syringe 600 mg   Return in about 2 weeks (around 07/29/2023) for Atopic Derm.  I, Ardis Rowan, RMA, am acting as scribe for Armida Sans, MD .   Documentation: I have reviewed the above documentation for accuracy and completeness, and I agree with the above.  Armida Sans, MD

## 2023-07-15 NOTE — Patient Instructions (Signed)

## 2023-07-26 ENCOUNTER — Encounter: Payer: Self-pay | Admitting: Dermatology

## 2023-08-02 ENCOUNTER — Ambulatory Visit: Payer: Medicare Other

## 2023-08-02 ENCOUNTER — Telehealth: Payer: Self-pay

## 2023-08-02 DIAGNOSIS — L209 Atopic dermatitis, unspecified: Secondary | ICD-10-CM

## 2023-08-02 MED ORDER — DUPILUMAB 300 MG/2ML ~~LOC~~ SOSY
300.0000 mg | PREFILLED_SYRINGE | SUBCUTANEOUS | Status: AC
  Administered 2023-08-02 – 2023-11-23 (×7): 300 mg via SUBCUTANEOUS

## 2023-08-02 NOTE — Addendum Note (Signed)
 Addended by: Evorn Gong B on: 08/02/2023 05:23 PM   Modules accepted: Orders

## 2023-08-02 NOTE — Progress Notes (Signed)
 Patient here today for 2 week DUpixent injection for Atopic Dermatitis.   Patient injected with Dupixent 300mg  into the right upper arm. Patient tolerated procedure well.   VHQ:[id number] Exp:02/2025  Evorn Gong RMA

## 2023-08-02 NOTE — Telephone Encounter (Signed)
 Patient here today for Dupixent injection. Ok to add in orders for continued use?

## 2023-08-17 ENCOUNTER — Ambulatory Visit (INDEPENDENT_AMBULATORY_CARE_PROVIDER_SITE_OTHER): Payer: Medicare Other

## 2023-08-17 DIAGNOSIS — L209 Atopic dermatitis, unspecified: Secondary | ICD-10-CM | POA: Diagnosis not present

## 2023-08-17 NOTE — Progress Notes (Signed)
Patient here today for 2 week Dupixent injection for Atopic Dermatitis.    Patient injected with Dupixent 300mg  into the left upper arm. Patient tolerated procedure well.    Lot: [id number] Exp: 03/26/2025   Dorathy Daft, RMA

## 2023-08-31 ENCOUNTER — Ambulatory Visit (INDEPENDENT_AMBULATORY_CARE_PROVIDER_SITE_OTHER): Payer: Medicare Other

## 2023-08-31 DIAGNOSIS — L209 Atopic dermatitis, unspecified: Secondary | ICD-10-CM

## 2023-08-31 NOTE — Progress Notes (Signed)
Patient here today for 2 week Dupixent injection for Atopic Dermatitis.    Patient injected with Dupixent 300mg  into the left upper arm. Patient tolerated procedure well.    Lot: [id number] Exp: 03/26/2025   Dorathy Daft, RMA

## 2023-09-08 ENCOUNTER — Ambulatory Visit: Payer: Medicare Other | Admitting: Emergency Medicine

## 2023-09-08 VITALS — Ht 72.0 in | Wt 220.0 lb

## 2023-09-08 DIAGNOSIS — Z0000 Encounter for general adult medical examination without abnormal findings: Secondary | ICD-10-CM | POA: Diagnosis not present

## 2023-09-08 NOTE — Patient Instructions (Signed)
Mr. Fore , Thank you for taking time to come for your Medicare Wellness Visit. I appreciate your ongoing commitment to your health goals. Please review the following plan we discussed and let me know if I can assist you in the future.   Referrals/Orders/Follow-Ups/Clinician Recommendations: Schedule an eye exam at your earliest convenience. I have scheduled you an appointment on 10/06/23 @ 1:20pm with Dr. Jacquenette Shone.  This is a list of the screening recommended for you and due dates:  Health Maintenance  Topic Date Due   DTaP/Tdap/Td vaccine (1 - Tdap) Never done   COVID-19 Vaccine (3 - Pfizer risk series) 11/08/2019   Screening for Lung Cancer  11/09/2023   Medicare Annual Wellness Visit  09/07/2024   Colon Cancer Screening  09/26/2026   Pneumonia Vaccine  Completed   Flu Shot  Completed   Hepatitis C Screening  Completed   Zoster (Shingles) Vaccine  Completed   HPV Vaccine  Aged Out    Advanced directives: (Declined) Advance directive discussed with you today. Even though you declined this today, please call our office should you change your mind, and we can give you the proper paperwork for you to fill out.  Next Medicare Annual Wellness Visit scheduled for next year: Yes, 08/27/24 @ 1:10pm (phone visit)

## 2023-09-08 NOTE — Progress Notes (Signed)
Subjective:   Richard Bean is a 73 y.o. male who presents for Medicare Annual/Subsequent preventive examination.  This patient declined Interactive audio and Acupuncturist. Therefore the visit was completed with audio only.   Visit Complete: Virtual I connected with  [name] on 09/08/23 by a audio enabled telemedicine application and verified that I am speaking with the correct person using two identifiers.  Patient Location: Home  Provider Location: Home Office  I discussed the limitations of evaluation and management by telemedicine. The patient expressed understanding and agreed to proceed.  Vital Signs: Because this visit was a virtual/telehealth visit, some criteria may be missing or patient reported. Any vitals not documented were not able to be obtained and vitals that have been documented are patient reported.   Cardiac Risk Factors include: advanced age (>56men, >64 women);male gender;dyslipidemia;hypertension;smoking/ tobacco exposure     Objective:    Today's Vitals   09/08/23 1304 09/08/23 1305  Weight: 220 lb (99.8 kg)   Height: 6' (1.829 m)   PainSc:  4    Body mass index is 29.84 kg/m.     09/08/2023    1:20 PM 09/07/2022   11:08 AM 09/25/2021    9:32 AM 09/24/2021    9:09 AM 10/02/2020    7:18 AM 06/26/2020    8:06 AM 08/08/2018    3:01 PM  Advanced Directives  Does Patient Have a Medical Advance Directive? No No No No No No   Would patient like information on creating a medical advance directive? No - Patient declined Yes (MAU/Ambulatory/Procedural Areas - Information given)   No - Patient declined       Information is confidential and restricted. Go to Review Flowsheets to unlock data.     Current Medications (verified) Outpatient Encounter Medications as of 09/08/2023  Medication Sig   amLODipine (NORVASC) 5 MG tablet Take 1 tablet (5 mg total) by mouth daily.   atorvastatin (LIPITOR) 20 MG tablet TAKE 1 TABLET DAILY   enalapril  (VASOTEC) 5 MG tablet Take 1 tablet (5 mg total) by mouth daily.   escitalopram (LEXAPRO) 20 MG tablet TAKE 1 TABLET DAILY   ezetimibe (ZETIA) 10 MG tablet Take 1 tablet (10 mg total) by mouth daily.   imipramine (TOFRANIL) 25 MG tablet TAKE 1 TABLET AT BEDTIME   omeprazole (PRILOSEC) 40 MG capsule Take 1 capsule (40 mg total) by mouth 2 (two) times daily before a meal.   sildenafil (REVATIO) 20 MG tablet Take 1 tablet (20 mg total) by mouth as directed.   propranolol (INDERAL) 10 MG tablet Take 1 tablet (10 mg total) by mouth daily as needed. (Patient not taking: Reported on 09/08/2023)   triamcinolone cream (KENALOG) 0.1 % For eczema apply to aa's rash BID x 2 weeks. Then decrease to BID 5d/wk PRN. Avoid applying to face, groin, and axilla. Use as directed. Long-term use can cause thinning of the skin. (Patient not taking: Reported on 09/08/2023)   Facility-Administered Encounter Medications as of 09/08/2023  Medication   dupilumab (DUPIXENT) prefilled syringe 300 mg    Allergies (verified) Chlorpheniramine-phenylephrine   History: Past Medical History:  Diagnosis Date   Actinic keratosis    Anxiety    Cancer (HCC)    Depression    GERD (gastroesophageal reflux disease)    H/O prostate cancer    Heart murmur    Kidney disease    Liver disease    Panic disorder with agoraphobia    Past Surgical History:  Procedure  Laterality Date   ANKLE SURGERY Right    COLONOSCOPY WITH PROPOFOL N/A 09/25/2021   Procedure: COLONOSCOPY WITH PROPOFOL;  Surgeon: Toney Reil, MD;  Location: [facility] ENDOSCOPY;  Service: Gastroenterology;  Laterality: N/A;   ESOPHAGOGASTRODUODENOSCOPY (EGD) WITH PROPOFOL N/A 06/26/2020   Procedure: ESOPHAGOGASTRODUODENOSCOPY (EGD) WITH PROPOFOL;  Surgeon: Toney Reil, MD;  Location: [facility] ENDOSCOPY;  Service: Gastroenterology;  Laterality: N/A;   ESOPHAGOGASTRODUODENOSCOPY (EGD) WITH PROPOFOL N/A 10/02/2020   Procedure: ESOPHAGOGASTRODUODENOSCOPY (EGD) WITH  PROPOFOL;  Surgeon: Toney Reil, MD;  Location: [facility] ENDOSCOPY;  Service: Gastroenterology;  Laterality: N/A;   PROSTATE SURGERY     Family History  Problem Relation Age of Onset   COPD Mother    Prostate cancer Father    Cancer - Lung Father    Depression Sister    Stomach cancer Sister    Alcohol abuse Brother    Heart block Brother    Breast cancer Sister    Prostate cancer Brother    Skin cancer Brother    Social History   Socioeconomic History   Marital status: Legally Separated    Spouse name: Not on file   Number of children: 1   Years of education: Not on file   Highest education level: Not on file  Occupational History   Not on file  Tobacco Use   Smoking status: Every Day    Current packs/day: 1.00    Average packs/day: 1 pack/day for 52.3 years (52.3 ttl pk-yrs)    Types: Cigarettes    Start date: 05/06/1971   Smokeless tobacco: Never  Vaping Use   Vaping status: Never Used  Substance and Sexual Activity   Alcohol use: Yes    Alcohol/week: 16.0 standard drinks of alcohol    Types: 6 Cans of beer, 10 Shots of liquor per week    Comment: 2-3 drinks 3 times per week   Drug use: No   Sexual activity: Yes  Other Topics Concern   Not on file  Social History Narrative   Not on file   Social Drivers of Health   Financial Resource Strain: Low Risk  (09/08/2023)   Overall Financial Resource Strain (CARDIA)    Difficulty of Paying Living Expenses: Not hard at all  Food Insecurity: No Food Insecurity (09/08/2023)   Hunger Vital Sign    Worried About Running Out of Food in the Last Year: Never true    Ran Out of Food in the Last Year: Never true  Transportation Needs: No Transportation Needs (09/08/2023)   PRAPARE - Administrator, Civil Service (Medical): No    Lack of Transportation (Non-Medical): No  Physical Activity: Inactive (09/08/2023)   Exercise Vital Sign    Days of Exercise per Week: 0 days    Minutes of Exercise per Session: 0  min  Stress: No Stress Concern Present (09/08/2023)   [employer] of Occupational Health - Occupational Stress Questionnaire    Feeling of Stress : Not at all  Social Connections: Socially Isolated (09/08/2023)   Social Connection and Isolation Panel [NHANES]    Frequency of Communication with Friends and Family: Three times a week    Frequency of Social Gatherings with Friends and Family: Twice a week    Attends Religious Services: Never    Database administrator or Organizations: No    Attends Banker Meetings: Never    Marital Status: Separated    Tobacco Counseling Ready to quit: Not Answered Counseling given: Not Answered  Clinical Intake:  Pre-visit preparation completed: Yes  Pain : 0-10 Pain Score: 4  Pain Type: Acute pain Pain Location: Face (sinus infection) Pain Descriptors / Indicators: Aching     BMI - recorded: 29.84 Nutritional Status: BMI 25 -29 Overweight Nutritional Risks: None Diabetes: No  How often do you need to have someone help you when you read instructions, pamphlets, or other written materials from your doctor or pharmacy?: 1 - Never  Interpreter Needed?: No  Information entered by :: Tora Kindred, CMA   Activities of Daily Living    09/08/2023    1:07 PM 09/10/2022    1:30 PM  In your present state of health, do you have any difficulty performing the following activities:  Hearing? 0 0  Vision? 0 0  Difficulty concentrating or making decisions? 0 0  Walking or climbing stairs? 0 0  Dressing or bathing? 0 0  Doing errands, shopping? 0 0  Preparing Food and eating ? N   Using the Toilet? N   In the past six months, have you accidently leaked urine? Y   Comment wears depends   Do you have problems with loss of bowel control? N   Managing your Medications? N   Managing your Finances? N   Housekeeping or managing your Housekeeping? N     Patient Care Team: Pardue, Monico Blitz, DO as PCP - General (Family  Medicine) Antonieta Iba, MD as Consulting Physician (Cardiology)  Indicate any recent Medical Services you may have received from other than [facility] providers in the past year (date may be approximate).     Assessment:   This is a routine wellness examination for Ferguson.  Hearing/Vision screen Hearing Screening - Comments:: Denies hearing loss Vision Screening - Comments:: Needs eye exam, Will call and schedule eye exam.   Goals Addressed               This Visit's Progress     Exercise 3x per week (30 min per time) (pt-stated)        Depression Screen    09/08/2023    1:18 PM 03/17/2023    2:21 PM 09/10/2022    1:30 PM 09/07/2022   11:05 AM 06/08/2022    1:58 PM 05/20/2022    2:34 PM 01/28/2022    1:19 PM  PHQ 2/9 Scores  PHQ - 2 Score 0 0 0 0  1 1  PHQ- 9 Score  2 0   5 2     Information is confidential and restricted. Go to Review Flowsheets to unlock data.     Fall Risk    09/08/2023    1:22 PM 03/17/2023    2:21 PM 09/10/2022    1:29 PM 09/07/2022   11:02 AM 05/20/2022    2:34 PM  Fall Risk   Falls in the past year? 0 0 0 0 0  Number falls in past yr: 0  0 0 0  Injury with Fall? 0 0 0 0 0  Risk for fall due to : No Fall Risks No Fall Risks  No Fall Risks No Fall Risks  Follow up Falls prevention discussed;Falls evaluation completed Falls evaluation completed  Follow up appointment;Falls prevention discussed Falls evaluation completed    MEDICARE RISK AT HOME: Medicare Risk at Home Any stairs in or around the home?: Yes If so, are there any without handrails?: No Home free of loose throw rugs in walkways, pet beds, electrical cords, etc?: Yes Adequate lighting in your home to  reduce risk of falls?: Yes Life alert?: No Use of a cane, walker or w/c?: No Grab bars in the bathroom?: No Shower chair or bench in shower?: No Elevated toilet seat or a handicapped toilet?: No  TIMED UP AND GO:  Was the test performed?  No    Cognitive Function:         09/08/2023    1:23 PM 09/07/2022   11:13 AM 04/04/2020    2:49 PM  6CIT Screen  What Year? 0 points 0 points 0 points  What month? 0 points 0 points 0 points  What time? 0 points 0 points 0 points  Count back from 20 0 points 0 points 0 points  Months in reverse 0 points 0 points 0 points  Repeat phrase 0 points 0 points 2 points  Total Score 0 points 0 points 2 points    Immunizations Immunization History  Administered Date(s) Administered   Fluad Quad(high Dose 65+) 04/04/2020, 08/27/2021, 05/20/2022, 03/17/2023   Influenza, High Dose Seasonal PF 06/04/2016, 05/05/2018   Influenza,inj,Quad PF,6+ Mos 06/17/2015   PFIZER(Purple Top)SARS-COV-2 Vaccination 09/20/2019, 10/11/2019   Pneumococcal Conjugate-13 10/12/2016   Pneumococcal Polysaccharide-23 03/09/2019   Zoster Recombinant(Shingrix) 09/03/2021, 01/21/2022   Zoster, Live 05/24/2014    TDAP status: Up to date per patient  Flu Vaccine status: Up to date  Pneumococcal vaccine status: Up to date  Covid-19 vaccine status: Declined, Education has been provided regarding the importance of this vaccine but patient still declined. Advised may receive this vaccine at local pharmacy or Health Dept.or vaccine clinic. Aware to provide a copy of the vaccination record if obtained from local pharmacy or Health Dept. Verbalized acceptance and understanding.  Qualifies for Shingles Vaccine? Yes   Zostavax completed Yes   Shingrix Completed?: Yes  Screening Tests Health Maintenance  Topic Date Due   DTaP/Tdap/Td (1 - Tdap) Never done   COVID-19 Vaccine (3 - Pfizer risk series) 11/08/2019   Lung Cancer Screening  11/09/2023   Medicare Annual Wellness (AWV)  09/07/2024   Colonoscopy  09/26/2026   Pneumonia Vaccine 54+ Years old  Completed   INFLUENZA VACCINE  Completed   Hepatitis C Screening  Completed   Zoster Vaccines- Shingrix  Completed   HPV VACCINES  Aged Out    Health Maintenance  Health Maintenance Due  Topic Date Due    DTaP/Tdap/Td (1 - Tdap) Never done   COVID-19 Vaccine (3 - Pfizer risk series) 11/08/2019    Colorectal cancer screening: Type of screening: Colonoscopy. Completed 10/16/21. Repeat every 5 years  Lung Cancer Screening: (Low Dose CT Chest recommended if Age 21-80 years, 20 pack-year currently smoking OR have quit w/in 15years.) does qualify.   Lung Cancer Screening Referral: order placed 11/12/22  Additional Screening:  Hepatitis C Screening: does not qualify; Completed 03/09/19  Vision Screening: Recommended annual ophthalmology exams for early detection of glaucoma and other disorders of the eye.  Dental Screening: Recommended annual dental exams for proper oral hygiene    Community Resource Referral / Chronic Care Management: CRR required this visit?  No   CCM required this visit?  No     Plan:     I have personally reviewed and noted the following in the patient's chart:   Medical and social history Use of alcohol, tobacco or illicit drugs  Current medications and supplements including opioid prescriptions. Patient is not currently taking opioid prescriptions. Functional ability and status Nutritional status Physical activity Advanced directives List of other physicians Hospitalizations, surgeries, and ER  visits in previous 12 months Vitals Screenings to include cognitive, depression, and falls Referrals and appointments  In addition, I have reviewed and discussed with patient certain preventive protocols, quality metrics, and best practice recommendations. A written personalized care plan for preventive services as well as general preventive health recommendations were provided to patient.     Tora Kindred, CMA   09/08/2023   After Visit Summary: (Declined) Due to this being a telephonic visit, with patients personalized plan was offered to patient but patient Declined AVS at this time   Nurse Notes:  Declined Covid vaccine Scheduled appt for 10/06/23 for  routine follow up. Patient last seen 03/17/23 (previous [place] and [place] patient)

## 2023-09-14 ENCOUNTER — Ambulatory Visit (INDEPENDENT_AMBULATORY_CARE_PROVIDER_SITE_OTHER): Payer: Medicare Other

## 2023-09-14 DIAGNOSIS — L209 Atopic dermatitis, unspecified: Secondary | ICD-10-CM

## 2023-09-14 NOTE — Progress Notes (Signed)
Patient here today for 2 week Dupixent injection for Atopic Dermatitis.    Patient injected with Dupixent 300mg  into the right upper arm. Patient tolerated procedure well.    Lot: [id number] Exp: 03/26/2025   Dorathy Daft, RMA

## 2023-09-28 ENCOUNTER — Ambulatory Visit (INDEPENDENT_AMBULATORY_CARE_PROVIDER_SITE_OTHER): Payer: Medicare Other

## 2023-09-28 DIAGNOSIS — L209 Atopic dermatitis, unspecified: Secondary | ICD-10-CM

## 2023-09-28 NOTE — Progress Notes (Signed)
 Patient here today for 2 week Dupixent injection for Atopic Dermatitis.    Patient injected with Dupixent 300mg  into the left upper arm. Patient tolerated procedure well.    Lot: [id number] Exp: 10/2025   Dorathy Daft, RMA

## 2023-10-06 ENCOUNTER — Encounter: Payer: Self-pay | Admitting: Family Medicine

## 2023-10-06 ENCOUNTER — Ambulatory Visit (INDEPENDENT_AMBULATORY_CARE_PROVIDER_SITE_OTHER): Payer: Medicare Other | Admitting: Family Medicine

## 2023-10-06 VITALS — BP 125/66 | HR 93 | Ht 72.0 in | Wt 222.3 lb

## 2023-10-06 DIAGNOSIS — K7460 Unspecified cirrhosis of liver: Secondary | ICD-10-CM | POA: Diagnosis not present

## 2023-10-06 DIAGNOSIS — I10 Essential (primary) hypertension: Secondary | ICD-10-CM | POA: Diagnosis not present

## 2023-10-06 DIAGNOSIS — I700 Atherosclerosis of aorta: Secondary | ICD-10-CM

## 2023-10-06 DIAGNOSIS — Z79899 Other long term (current) drug therapy: Secondary | ICD-10-CM

## 2023-10-06 DIAGNOSIS — N2581 Secondary hyperparathyroidism of renal origin: Secondary | ICD-10-CM | POA: Diagnosis not present

## 2023-10-06 DIAGNOSIS — N1831 Chronic kidney disease, stage 3a: Secondary | ICD-10-CM

## 2023-10-06 DIAGNOSIS — Z716 Tobacco abuse counseling: Secondary | ICD-10-CM | POA: Insufficient documentation

## 2023-10-06 DIAGNOSIS — J438 Other emphysema: Secondary | ICD-10-CM

## 2023-10-06 DIAGNOSIS — F1024 Alcohol dependence with alcohol-induced mood disorder: Secondary | ICD-10-CM

## 2023-10-06 DIAGNOSIS — K2270 Barrett's esophagus without dysplasia: Secondary | ICD-10-CM

## 2023-10-06 DIAGNOSIS — Z8546 Personal history of malignant neoplasm of prostate: Secondary | ICD-10-CM

## 2023-10-06 DIAGNOSIS — G252 Other specified forms of tremor: Secondary | ICD-10-CM | POA: Insufficient documentation

## 2023-10-06 DIAGNOSIS — J432 Centrilobular emphysema: Secondary | ICD-10-CM | POA: Insufficient documentation

## 2023-10-06 DIAGNOSIS — F3342 Major depressive disorder, recurrent, in full remission: Secondary | ICD-10-CM

## 2023-10-06 DIAGNOSIS — E782 Mixed hyperlipidemia: Secondary | ICD-10-CM

## 2023-10-06 DIAGNOSIS — F17200 Nicotine dependence, unspecified, uncomplicated: Secondary | ICD-10-CM

## 2023-10-06 NOTE — Assessment & Plan Note (Signed)
 Depression managed with escitalopram. Mood is stable with no recent panic attacks.

## 2023-10-06 NOTE — Progress Notes (Signed)
 Established patient visit   Patient: Richard Bean   DOB: [birth year]   73 y.o. Male  MRN: [id number] Visit Date: 10/06/2023  Today's healthcare provider: Sherlyn Hay, DO   Chief Complaint  Patient presents with   Medical Management of Chronic Issues   Hyperlipidemia   Hypertension    Left foot edema only symptom. Does not monitor blood pressure at home. Notices swelling almost every morning   Subjective    Hyperlipidemia Pertinent negatives include no chest pain, myalgias or shortness of breath.  Hypertension Pertinent negatives include no chest pain, headaches, palpitations or shortness of breath.   The patient presents for chronic follow-up and management of multiple medical conditions.  He has a history of prostate cancer diagnosed/treated in 2006. He was advised his PCP could check his PSA levels to avoid additional appointments.  He experiences hand tremors, particularly when writing, which have been present for about seven months. The tremors are worse when trying to perform tasks like writing, stating 'I can't even sign my name' and noting that he had to have someone help him vote. No family history of tremors is noted, and the tremors do not affect his eating (he has no trouble maneuvering utensils.  He continues to smoke and has daily thoughts about quitting. He previously attempted to quit using the patch, which he found helpful, but is not currently ready to try again. He drinks alcohol frequently, about every night for the past few weeks, typically consuming two drinks, but sometimes four to five when going out once or twice a week. He denies drinking in the morning or experiencing any legal or relationship issues related to alcohol use.   He has a history of emphysema but does not use inhalers and denies any trouble with breathing or physical activity limitations such as going upstairs.  No recent panic attacks, stating they only occur when driving. He  reports a stable mood overall.  He is currently taking amlodipine, atorvastatin, enalapril, ezetimibe, sildenafil, escitalopram, imipramine, and omeprazole. He no longer uses triamcinolone cream as he is receiving injections instead.     Medications: Outpatient Medications Prior to Visit  Medication Sig   amLODipine (NORVASC) 5 MG tablet Take 1 tablet (5 mg total) by mouth daily.   atorvastatin (LIPITOR) 20 MG tablet TAKE 1 TABLET DAILY   enalapril (VASOTEC) 5 MG tablet Take 1 tablet (5 mg total) by mouth daily.   escitalopram (LEXAPRO) 20 MG tablet TAKE 1 TABLET DAILY   ezetimibe (ZETIA) 10 MG tablet Take 1 tablet (10 mg total) by mouth daily.   imipramine (TOFRANIL) 25 MG tablet TAKE 1 TABLET AT BEDTIME   omeprazole (PRILOSEC) 40 MG capsule Take 1 capsule (40 mg total) by mouth 2 (two) times daily before a meal.   propranolol (INDERAL) 10 MG tablet Take 1 tablet (10 mg total) by mouth daily as needed.   sildenafil (REVATIO) 20 MG tablet Take 1 tablet (20 mg total) by mouth as directed.   [DISCONTINUED] triamcinolone cream (KENALOG) 0.1 % For eczema apply to aa's rash BID x 2 weeks. Then decrease to BID 5d/wk PRN. Avoid applying to face, groin, and axilla. Use as directed. Long-term use can cause thinning of the skin.   Facility-Administered Medications Prior to Visit  Medication Dose Route Frequency Provider   dupilumab (DUPIXENT) prefilled syringe 300 mg  300 mg Subcutaneous Q14 Days Deirdre Evener, MD    Review of Systems  Constitutional:  Negative for chills, diaphoresis  and fever.  Respiratory: Negative.  Negative for cough, shortness of breath and wheezing.   Cardiovascular:  Negative for chest pain, palpitations and leg swelling.  Gastrointestinal:  Negative for diarrhea, nausea and vomiting.  Musculoskeletal:  Negative for myalgias.  Neurological:  Negative for weakness and headaches.        Objective    BP 125/66 (BP Location: Right Arm, Patient Position: Sitting,  Cuff Size: Large)   Pulse 93   Ht 6' (1.829 m)   Wt 222 lb 4.8 oz (100.8 kg)   SpO2 97%   BMI 30.15 kg/m     Physical Exam Constitutional:      Appearance: Normal appearance.  HENT:     Head: Normocephalic and atraumatic.  Eyes:     General: No scleral icterus.    Extraocular Movements: Extraocular movements intact.     Conjunctiva/sclera: Conjunctivae normal.  Cardiovascular:     Rate and Rhythm: Normal rate and regular rhythm.     Pulses: Normal pulses.     Heart sounds: Normal heart sounds.  Pulmonary:     Effort: Pulmonary effort is normal. No respiratory distress.     Breath sounds: Normal breath sounds.  Musculoskeletal:     Right lower leg: No edema.     Left lower leg: No edema.  Skin:    General: Skin is warm and dry.  Neurological:     Mental Status: He is alert and oriented to person, place, and time. Mental status is at baseline.  Psychiatric:        Mood and Affect: Mood normal.        Behavior: Behavior normal.      No results found for any visits on 10/06/23.  Assessment & Plan    Primary hypertension Assessment & Plan: Hypertension well-controlled with amlodipine, enalapril, and atorvastatin. Blood pressure is good.  Orders: -     Comprehensive metabolic panel  Mixed hyperlipidemia Assessment & Plan: Hyperlipidemia managed with atorvastatin and ezetimibe. No recent cholesterol panel, last full chemistry panel in 2023. - Order cholesterol panel.  Orders: -     Lipid panel  Secondary hyperparathyroidism of renal origin [facility]) Assessment & Plan: Managed by nephrology; defer to specialist management.   Cirrhosis, non-alcoholic (HCC) Assessment & Plan: Fatty liver disease with increased alcohol consumption, contributing to liver issues. Advised to reduce alcohol intake to prevent progression to cirrhosis. - Advise reducing alcohol consumption to one drink per day, maximum two.   Paraseptal emphysema (HCC) Assessment & Plan: Emphysema  with no current respiratory symptoms. No use of inhalers.   Stage 3a chronic kidney disease (HCC) Assessment & Plan: Chronic kidney disease with regular nephrology follow-ups. Next appointment in August. Vitamin D and B12 levels will be checked due to chronic kidney disease and omeprazole use. - Order metabolic panel. - Check vitamin D and B12 levels.  Orders: -     VITAMIN D 25 Hydroxy (Vit-D Deficiency, Fractures)  Personal history of prostate cancer Assessment & Plan: Prostate cancer diagnosed in 2006. No current symptoms or active treatment. Oncologist recommended checking PSA levels to avoid additional visits. - Order PSA test.  Orders: -     PSA Total (Reflex To Free)  Barrett's esophagus without dysplasia Assessment & Plan: GERD managed with omeprazole. No current symptoms. B12 levels will be checked due to omeprazole use.  Orders: -     Vitamin B12  High risk medication use -     Vitamin B12  Aortic atherosclerosis (HCC) Assessment & Plan:  Noted. No acute concerns.  Continue to monitor. Continue atorvastatin, ezetimibe and amlodipine.   Centrilobular emphysema (HCC) Assessment & Plan: Emphysema with no current respiratory symptoms. No use of inhalers.   Primary writing tremor Assessment & Plan: Intention tremor in both hands, worsening with writing. No problems reported with other fine motor activities. No family history of tremors. Nicotine use may contribute to tremor. Reports no noticeable difference when drinking. Prefers to wait before considering neurology referral. - Advise smoking cessation.   Alcohol dependence with alcohol-induced mood disorder (HCC) Assessment & Plan: Increased alcohol consumption over the past few weeks, with regular drinking nearly every night. No history of legal or relationship issues related to alcohol. Advised to limit intake to one drink per day, maximum two.   Nicotine dependence with current use Assessment &  Plan: Current smoker with a desire to quit. Previous attempt with nicotine patch was helpful. Not currently ready to quit but considering trying again. Nicotine use may contribute to tremor. - Encourage smoking cessation. - Discuss nicotine patch as a cessation aid.   Encounter for smoking cessation counseling  Recurrent major depression in full remission (HCC) Assessment & Plan: Depression managed with escitalopram. Mood is stable with no recent panic attacks.   General Health Maintenance Due for a tetanus vaccine, last received over ten years ago. Declined COVID booster due to previous side effects. Lung cancer screening is up to date with next screening scheduled in April. - Recommend tetanus vaccine at pharmacy.   Return in about 6 months (around 04/07/2024) for HTN, chronic f/u.      I discussed the assessment and treatment plan with the patient  The patient was provided an opportunity to ask questions and all were answered. The patient agreed with the plan and demonstrated an understanding of the instructions.   The patient was advised to call back or seek an in-person evaluation if the symptoms worsen or if the condition fails to improve as anticipated.    Sherlyn Hay, DO  [facility] [phone] (phone) [phone] (fax)  [facility]

## 2023-10-06 NOTE — Assessment & Plan Note (Signed)
 Fatty liver disease with increased alcohol consumption, contributing to liver issues. Advised to reduce alcohol intake to prevent progression to cirrhosis. - Advise reducing alcohol consumption to one drink per day, maximum two.

## 2023-10-06 NOTE — Assessment & Plan Note (Signed)
 Chronic kidney disease with regular nephrology follow-ups. Next appointment in August. Vitamin D and B12 levels will be checked due to chronic kidney disease and omeprazole use. - Order metabolic panel. - Check vitamin D and B12 levels.

## 2023-10-06 NOTE — Assessment & Plan Note (Signed)
 Noted. No acute concerns.  Continue to monitor. Continue atorvastatin, ezetimibe and amlodipine.

## 2023-10-06 NOTE — Assessment & Plan Note (Signed)
 Increased alcohol consumption over the past few weeks, with regular drinking nearly every night. No history of legal or relationship issues related to alcohol. Advised to limit intake to one drink per day, maximum two.

## 2023-10-06 NOTE — Assessment & Plan Note (Signed)
 Emphysema with no current respiratory symptoms. No use of inhalers.

## 2023-10-06 NOTE — Assessment & Plan Note (Signed)
 Hyperlipidemia managed with atorvastatin and ezetimibe. No recent cholesterol panel, last full chemistry panel in 2023. - Order cholesterol panel.

## 2023-10-06 NOTE — Assessment & Plan Note (Signed)
 Prostate cancer diagnosed in 2006. No current symptoms or active treatment. Oncologist recommended checking PSA levels to avoid additional visits. - Order PSA test.

## 2023-10-06 NOTE — Assessment & Plan Note (Signed)
 GERD managed with omeprazole. No current symptoms. B12 levels will be checked due to omeprazole use.

## 2023-10-06 NOTE — Assessment & Plan Note (Deleted)
 Fatty liver disease with increased alcohol consumption, contributing to liver issues. Advised to reduce alcohol intake to prevent progression to cirrhosis. - Advise reducing alcohol consumption to one drink per day, maximum two.

## 2023-10-06 NOTE — Assessment & Plan Note (Addendum)
 Intention tremor in both hands, worsening with writing. No problems reported with other fine motor activities. No family history of tremors. Nicotine use may contribute to tremor. Reports no noticeable difference when drinking. Prefers to wait before considering neurology referral. - Advise smoking cessation.

## 2023-10-06 NOTE — Assessment & Plan Note (Signed)
 Current smoker with a desire to quit. Previous attempt with nicotine patch was helpful. Not currently ready to quit but considering trying again. Nicotine use may contribute to tremor. - Encourage smoking cessation. - Discuss nicotine patch as a cessation aid.

## 2023-10-06 NOTE — Assessment & Plan Note (Signed)
 Hypertension well-controlled with amlodipine, enalapril, and atorvastatin. Blood pressure is good.

## 2023-10-06 NOTE — Assessment & Plan Note (Addendum)
 Managed by nephrology; defer to specialist management.

## 2023-10-12 ENCOUNTER — Encounter: Payer: Self-pay | Admitting: Dermatology

## 2023-10-12 ENCOUNTER — Ambulatory Visit (INDEPENDENT_AMBULATORY_CARE_PROVIDER_SITE_OTHER): Admitting: Dermatology

## 2023-10-12 ENCOUNTER — Telehealth: Payer: Self-pay

## 2023-10-12 DIAGNOSIS — Z7189 Other specified counseling: Secondary | ICD-10-CM

## 2023-10-12 DIAGNOSIS — L578 Other skin changes due to chronic exposure to nonionizing radiation: Secondary | ICD-10-CM

## 2023-10-12 DIAGNOSIS — L821 Other seborrheic keratosis: Secondary | ICD-10-CM | POA: Diagnosis not present

## 2023-10-12 DIAGNOSIS — Z79899 Other long term (current) drug therapy: Secondary | ICD-10-CM | POA: Diagnosis not present

## 2023-10-12 DIAGNOSIS — L209 Atopic dermatitis, unspecified: Secondary | ICD-10-CM

## 2023-10-12 DIAGNOSIS — W908XXA Exposure to other nonionizing radiation, initial encounter: Secondary | ICD-10-CM

## 2023-10-12 MED ORDER — DUPILUMAB 300 MG/2ML ~~LOC~~ SOAJ
300.0000 mg | SUBCUTANEOUS | Status: AC
  Administered 2023-10-12 – 2024-03-28 (×11): 300 mg via SUBCUTANEOUS

## 2023-10-12 MED ORDER — DUPIXENT 300 MG/2ML ~~LOC~~ SOAJ: 300.0000 mg | SUBCUTANEOUS | 5 refills | Status: DC

## 2023-10-12 NOTE — Telephone Encounter (Signed)
 Patient seen by Dr. Gwen Pounds today. aw

## 2023-10-12 NOTE — Progress Notes (Signed)
   Follow-Up Visit   Subjective  Richard Bean is a 73 y.o. male who presents for the following: Atopic Dermatitis The patient has spots, moles and lesions to be evaluated, some may be new or changing and the patient may have concern these could be cancer.  The following portions of the chart were reviewed this encounter and updated as appropriate: medications, allergies, medical history  Review of Systems:  No other skin or systemic complaints except as noted in HPI or Assessment and Plan.  Objective  Well appearing patient in no apparent distress; mood and affect are within normal limits.  Areas Examined: Trunks, arms, face   Relevant physical exam findings are noted in the Assessment and Plan.   Assessment & Plan   ATOPIC DERMATITIS, UNSPECIFIED TYPE   Related Medications dupilumab (DUPIXENT) prefilled syringe 300 mg  Dupilumab SOAJ 300 mg  COUNSELING AND COORDINATION OF CARE   MEDICATION MANAGEMENT   LONG-TERM USE OF HIGH-RISK MEDICATION   ACTINIC SKIN DAMAGE   SEBORRHEIC KERATOSIS    ATOPIC DERMATITIS Exam: Scaly pink papules with small crusts, excoriations, and generalized xerosis  30% BSA. Chronic and persistent condition with duration or expected duration over one year. Condition is improving with treatment but not currently at goal. Pt is happy with results and decreased itch and wants to continue Dupixent. Atopic dermatitis - Severe, on Dupixent (biologic medication).  Atopic dermatitis (eczema) is a chronic, relapsing, pruritic condition that can significantly affect quality of life. It is often associated with allergic rhinitis and/or asthma and can require treatment with topical medications, phototherapy, or in severe cases biologic medications, which require long term medication management.    Treatment Plan: Continue Dupixent 300 mg/63mL SQ QOW. Patient denies side effects.  Dupixent 300mg /66mL injected SQ into the right upper arm. Patient  tolerated injection well.    Potential side effects include allergic reaction, herpes infections, injection site reactions and conjunctivitis (inflammation of the eyes).  The use of Dupixent requires long term medication management, including periodic office visits.    Long term medication management.  Patient is using long term (months to years) prescription medication  to control their dermatologic condition.  These medications require periodic monitoring to evaluate for efficacy and side effects and may require periodic laboratory monitoring.   Recommend gentle skin care.  SEBORRHEIC KERATOSIS - Stuck-on, waxy, tan-brown papules and/or plaques  - Benign-appearing - Discussed benign etiology and prognosis. - Observe - Call for any changes  ACTINIC DAMAGE - chronic, secondary to cumulative UV radiation exposure/sun exposure over time - diffuse scaly erythematous macules with underlying dyspigmentation - Recommend daily broad spectrum sunscreen SPF 30+ to sun-exposed areas, reapply every 2 hours as needed.  - Recommend staying in the shade or wearing long sleeves, sun glasses (UVA+UVB protection) and wide brim hats (4-inch brim around the entire circumference of the hat). - Call for new or changing lesions.   I, Dorathy Daft, RMA, am acting as scribe for Armida Sans, MD .    Documentation: I have reviewed the above documentation for accuracy and completeness, and I agree with the above.  Armida Sans, MD

## 2023-10-12 NOTE — Telephone Encounter (Signed)
 Do you recall if you were doing samples for patient's Dupixent as a trial period? No prescription has been sent in so I was going to get that completed today during his nurse visit and the Dupixent Enrollment Form?

## 2023-10-19 ENCOUNTER — Other Ambulatory Visit: Payer: Self-pay | Admitting: Cardiovascular Disease

## 2023-10-21 DIAGNOSIS — K2270 Barrett's esophagus without dysplasia: Secondary | ICD-10-CM | POA: Diagnosis not present

## 2023-10-21 DIAGNOSIS — E782 Mixed hyperlipidemia: Secondary | ICD-10-CM | POA: Diagnosis not present

## 2023-10-21 DIAGNOSIS — Z8546 Personal history of malignant neoplasm of prostate: Secondary | ICD-10-CM | POA: Diagnosis not present

## 2023-10-21 DIAGNOSIS — I10 Essential (primary) hypertension: Secondary | ICD-10-CM | POA: Diagnosis not present

## 2023-10-21 DIAGNOSIS — Z79899 Other long term (current) drug therapy: Secondary | ICD-10-CM | POA: Diagnosis not present

## 2023-10-21 DIAGNOSIS — N1831 Chronic kidney disease, stage 3a: Secondary | ICD-10-CM | POA: Diagnosis not present

## 2023-10-22 LAB — LIPID PANEL
Chol/HDL Ratio: 1.7 ratio (ref 0.0–5.0)
Cholesterol, Total: 147 mg/dL (ref 100–199)
HDL: 87 mg/dL (ref >39)
LDL Chol Calc (NIH): 47 mg/dL (ref 0–99)
Triglycerides: 64 mg/dL (ref 0–149)
VLDL Cholesterol Cal: 13 mg/dL (ref 5–40)

## 2023-10-22 LAB — COMPREHENSIVE METABOLIC PANEL WITH GFR
ALT: 23 IU/L (ref 0–44)
AST: 27 IU/L (ref 0–40)
Albumin: 4.6 g/dL (ref 3.8–4.8)
Alkaline Phosphatase: 168 IU/L — ABNORMAL HIGH (ref 44–121)
BUN/Creatinine Ratio: 11 (ref 10–24)
BUN: 16 mg/dL (ref 8–27)
Bilirubin Total: 0.4 mg/dL (ref 0.0–1.2)
CO2: 22 mmol/L (ref 20–29)
Calcium: 9.7 mg/dL (ref 8.6–10.2)
Chloride: 97 mmol/L (ref 96–106)
Creatinine, Ser: 1.48 mg/dL — ABNORMAL HIGH (ref 0.76–1.27)
Globulin, Total: 2.7 g/dL (ref 1.5–4.5)
Glucose: 104 mg/dL — ABNORMAL HIGH (ref 70–99)
Potassium: 5.1 mmol/L (ref 3.5–5.2)
Sodium: 138 mmol/L (ref 134–144)
Total Protein: 7.3 g/dL (ref 6.0–8.5)
eGFR: 50 mL/min/{1.73_m2} — ABNORMAL LOW (ref >59)

## 2023-10-22 LAB — PSA TOTAL (REFLEX TO FREE): Prostate Specific Ag, Serum: <0.1 ng/mL (ref 0.0–4.0)

## 2023-10-22 LAB — VITAMIN D 25 HYDROXY (VIT D DEFICIENCY, FRACTURES): Vit D, 25-Hydroxy: 19.3 ng/mL — ABNORMAL LOW (ref 30.0–100.0)

## 2023-10-22 LAB — VITAMIN B12: Vitamin B-12: 238 pg/mL (ref 232–1245)

## 2023-10-26 ENCOUNTER — Other Ambulatory Visit: Payer: Self-pay

## 2023-10-26 ENCOUNTER — Ambulatory Visit (INDEPENDENT_AMBULATORY_CARE_PROVIDER_SITE_OTHER)

## 2023-10-26 DIAGNOSIS — L209 Atopic dermatitis, unspecified: Secondary | ICD-10-CM

## 2023-10-26 MED ORDER — DUPIXENT 300 MG/2ML ~~LOC~~ SOAJ: 300.0000 mg | SUBCUTANEOUS | 5 refills | Status: DC

## 2023-10-26 NOTE — Progress Notes (Signed)
 Prescription never picked up from being sent to [facility] on 10/12/23. aw

## 2023-10-26 NOTE — Progress Notes (Signed)
 Patient here today for 2 week Dupixent injection for Atopic Dermatitis.    Patient injected with Dupixent 300mg  into the left upper arm. Patient tolerated procedure well.    Lot: [id number] Exp: 02/2025   Dorathy Daft, RMA

## 2023-11-09 ENCOUNTER — Ambulatory Visit (INDEPENDENT_AMBULATORY_CARE_PROVIDER_SITE_OTHER)

## 2023-11-09 DIAGNOSIS — L209 Atopic dermatitis, unspecified: Secondary | ICD-10-CM

## 2023-11-09 NOTE — Progress Notes (Signed)
 Patient here today for 2 week Dupixent injection for Atopic Dermatitis.    Patient injected with Dupixent 300mg  into the right upper arm. Patient tolerated procedure well.    Lot: [id number] Exp: 09/2025   Mara Seminole CMA, AAMA

## 2023-11-18 ENCOUNTER — Telehealth: Payer: Self-pay

## 2023-11-18 NOTE — Telephone Encounter (Signed)
 Arnetha Bhat is working on prior authorization and would like to know does the patient have a contraindication to, intolerability to access treatment, or have they failed treatment with narrowband UVB phototherapy? Please advise.

## 2023-11-22 ENCOUNTER — Other Ambulatory Visit: Payer: Self-pay

## 2023-11-22 MED ORDER — DUPIXENT 300 MG/2ML ~~LOC~~ SOAJ: 300.0000 mg | SUBCUTANEOUS | 5 refills | Status: DC

## 2023-11-22 NOTE — Progress Notes (Signed)
 Insurance requires patient to use [facility].

## 2023-11-23 ENCOUNTER — Ambulatory Visit (INDEPENDENT_AMBULATORY_CARE_PROVIDER_SITE_OTHER)

## 2023-11-23 DIAGNOSIS — L209 Atopic dermatitis, unspecified: Secondary | ICD-10-CM

## 2023-11-23 NOTE — Progress Notes (Deleted)
 BH MD/PA/NP OP Progress Note  11/23/2023 11:43 AM Richard Bean  MRN:  [id number]  Chief Complaint: No chief complaint on file.  HPI: *** - he is not seen since Nov 2024  Alcohol Managed by pcp   Daily routine: go to lake, shoot pool, plays with his dog Exercise: walk 3 miles every day Employment: retired in 2006, Sport and exercise psychologist Support: brothers Household: by himself Marital status: separated, married twice Number of children: 1 daughter in [place], 1 yo He grew up in a farm [place]. He had a "fine" childhood. he loved his parents. He has 2 living brother. He eats lunch with them weekly, occasionally with his cousin as well   Visit Diagnosis: No diagnosis found.  Past Psychiatric History: Please see initial evaluation for full details. I have reviewed the history. No updates at this time.     Past Medical History:  Past Medical History:  Diagnosis Date   Actinic keratosis    Anxiety    Cancer (HCC)    Depression    GERD (gastroesophageal reflux disease)    H/O prostate cancer    Heart murmur    Kidney disease    Liver disease    Panic disorder with agoraphobia     Past Surgical History:  Procedure Laterality Date   ANKLE SURGERY Right    COLONOSCOPY WITH PROPOFOL  N/A 09/25/2021   Procedure: COLONOSCOPY WITH PROPOFOL ;  Surgeon: Selena Daily, MD;  Location: [facility] ENDOSCOPY;  Service: Gastroenterology;  Laterality: N/A;   ESOPHAGOGASTRODUODENOSCOPY (EGD) WITH PROPOFOL  N/A 06/26/2020   Procedure: ESOPHAGOGASTRODUODENOSCOPY (EGD) WITH PROPOFOL ;  Surgeon: Selena Daily, MD;  Location: [facility] ENDOSCOPY;  Service: Gastroenterology;  Laterality: N/A;   ESOPHAGOGASTRODUODENOSCOPY (EGD) WITH PROPOFOL  N/A 10/02/2020   Procedure: ESOPHAGOGASTRODUODENOSCOPY (EGD) WITH PROPOFOL ;  Surgeon: Selena Daily, MD;  Location: [facility] ENDOSCOPY;  Service: Gastroenterology;  Laterality: N/A;   PROSTATE SURGERY      Family Psychiatric History: Please see initial evaluation for  full details. I have reviewed the history. No updates at this time.     Family History:  Family History  Problem Relation Age of Onset   COPD Mother    Prostate cancer Father    Cancer - Lung Father    Depression Sister    Stomach cancer Sister    Alcohol abuse Brother    Heart block Brother    Breast cancer Sister    Prostate cancer Brother    Skin cancer Brother     Social History:  Social History   Socioeconomic History   Marital status: Legally Separated    Spouse name: Not on file   Number of children: 1   Years of education: Not on file   Highest education level: Not on file  Occupational History   Not on file  Tobacco Use   Smoking status: Every Day    Current packs/day: 1.00    Average packs/day: 1 pack/day for 52.6 years (52.6 ttl pk-yrs)    Types: Cigarettes    Start date: 05/06/1971   Smokeless tobacco: Never  Vaping Use   Vaping status: Never Used  Substance and Sexual Activity   Alcohol use: Yes    Alcohol/week: 16.0 standard drinks of alcohol    Types: 6 Cans of beer, 10 Shots of liquor per week    Comment: 2-3 drinks 3 times per week   Drug use: No   Sexual activity: Yes  Other Topics Concern   Not on file  Social History Narrative  Not on file   Social Drivers of Health   Financial Resource Strain: Low Risk  (09/08/2023)   Overall Financial Resource Strain (CARDIA)    Difficulty of Paying Living Expenses: Not hard at all  Food Insecurity: No Food Insecurity (09/08/2023)   Hunger Vital Sign    Worried About Running Out of Food in the Last Year: Never true    Ran Out of Food in the Last Year: Never true  Transportation Needs: No Transportation Needs (09/08/2023)   PRAPARE - Administrator, Civil Service (Medical): No    Lack of Transportation (Non-Medical): No  Physical Activity: Inactive (09/08/2023)   Exercise Vital Sign    Days of Exercise per Week: 0 days    Minutes of Exercise per Session: 0 min  Stress: No Stress Concern  Present (09/08/2023)   [employer] of Occupational Health - Occupational Stress Questionnaire    Feeling of Stress : Not at all  Social Connections: Socially Isolated (09/08/2023)   Social Connection and Isolation Panel [NHANES]    Frequency of Communication with Friends and Family: Three times a week    Frequency of Social Gatherings with Friends and Family: Twice a week    Attends Religious Services: Never    Database administrator or Organizations: No    Attends Banker Meetings: Never    Marital Status: Separated    Allergies:  Allergies  Allergen Reactions   Chlorpheniramine-Phenylephrine Other (See Comments) and Nausea Only    Actifed Makes cold symptoms worse Other reaction(s): Unknown Actifed Makes cold symptoms worse Other reaction(s): Unknown     Metabolic Disorder Labs: Lab Results  Component Value Date   HGBA1C 5.4 09/16/2021   No results found for: "PROLACTIN" Lab Results  Component Value Date   CHOL 147 10/21/2023   TRIG 64 10/21/2023   HDL 87 10/21/2023   CHOLHDL 1.7 10/21/2023   LDLCALC 47 10/21/2023   LDLCALC 47 04/20/2023   Lab Results  Component Value Date   TSH 1.710 09/16/2021   TSH 1.270 03/09/2019    Therapeutic Level Labs: No results found for: "LITHIUM" No results found for: "VALPROATE" No results found for: "CBMZ"  Current Medications: Current Outpatient Medications  Medication Sig Dispense Refill   amLODipine  (NORVASC ) 5 MG tablet Take 1 tablet (5 mg total) by mouth daily. 90 tablet 3   atorvastatin  (LIPITOR) 20 MG tablet TAKE 1 TABLET DAILY 90 tablet 3   Dupilumab  (DUPIXENT ) 300 MG/2ML SOAJ Inject 300 mg into the skin every 14 (fourteen) days. Starting at day 15 for maintenance. 4 mL 5   enalapril  (VASOTEC ) 5 MG tablet Take 1 tablet (5 mg total) by mouth daily. 90 tablet 3   escitalopram  (LEXAPRO ) 20 MG tablet TAKE 1 TABLET DAILY 90 tablet 3   ezetimibe  (ZETIA ) 10 MG tablet TAKE 1 TABLET DAILY 90 tablet 1    imipramine  (TOFRANIL ) 25 MG tablet TAKE 1 TABLET AT BEDTIME 90 tablet 3   omeprazole  (PRILOSEC) 40 MG capsule Take 1 capsule (40 mg total) by mouth 2 (two) times daily before a meal. 180 capsule 1   propranolol  (INDERAL ) 10 MG tablet Take 1 tablet (10 mg total) by mouth daily as needed. 90 tablet 3   sildenafil  (REVATIO ) 20 MG tablet Take 1 tablet (20 mg total) by mouth as directed. 90 tablet 3   Current Facility-Administered Medications  Medication Dose Route Frequency Provider Last Rate Last Admin   dupilumab  (DUPIXENT ) prefilled syringe 300 mg  300 mg  Subcutaneous Q14 Days Elta Halter, MD   300 mg at 10/26/23 1619   Dupilumab  SOAJ 300 mg  300 mg Subcutaneous Q14 Days Elta Halter, MD   300 mg at 11/09/23 1338     Musculoskeletal: Strength & Muscle Tone: within normal limits Gait & Station: normal Patient leans: N/A  Psychiatric Specialty Exam: Review of Systems  There were no vitals taken for this visit.There is no height or weight on file to calculate BMI.  General Appearance: {Appearance:22683}  Eye Contact:  {BHH EYE CONTACT:22684}  Speech:  {Speech:22685}  Volume:  {Volume (PAA):22686}  Mood:  {BHH MOOD:22306}  Affect:  {Affect (PAA):22687}  Thought Process:  {Thought Process (PAA):22688}  Orientation:  {BHH ORIENTATION (PAA):22689}  Thought Content: {Thought Content:22690}   Suicidal Thoughts:  {ST/HT (PAA):22692}  Homicidal Thoughts:  {ST/HT (PAA):22692}  Memory:  {BHH MEMORY:22881}  Judgement:  {Judgement (PAA):22694}  Insight:  {Insight (PAA):22695}  Psychomotor Activity:  {Psychomotor (PAA):22696}  Concentration:  {Concentration:21399}  Recall:  {BHH GOOD/FAIR/POOR:22877}  Fund of Knowledge: {BHH GOOD/FAIR/POOR:22877}  Language: {BHH GOOD/FAIR/POOR:22877}  Akathisia:  {BHH YES OR NO:22294}  Handed:  {Handed:22697}  AIMS (if indicated): {Desc; done/not:10129}  Assets:  {Assets (PAA):22698}  ADL's:  {BHH UEA'V:40981}  Cognition: {chl bhh  cognition:304700322}  Sleep:  {BHH GOOD/FAIR/POOR:22877}   Screenings: AUDIT    Flowsheet Row Office Visit from 10/06/2023 in [facility] Clinical Support from 09/08/2023 in [facility] Clinical Support from 09/07/2022 in [facility] Clinical Support from 04/04/2020 in [facility]  Alcohol Use Disorder Identification Test Final Score (AUDIT) 4 5 4 4       GAD-7    Flowsheet Row Office Visit from 03/17/2023 in [facility] Office Visit from 06/08/2022 in [facility]  Total GAD-7 Score 0 0      PHQ2-9    Flowsheet Row Clinical Support from 09/08/2023 in [facility] Office Visit from 03/17/2023 in [facility] Office Visit from 09/10/2022 in [facility] Clinical Support from 09/07/2022 in [facility] Office Visit from 06/08/2022 in [facility]  PHQ-2 Total Score 0 0 0 0 2  PHQ-9 Total Score -- 2 0 -- 3      Flowsheet Row Admission (Discharged) from 09/25/2021 in [facility] Admission (Discharged) from 09/24/2021 in [facility] Video Visit from 05/20/2021 in [facility]  C-SSRS RISK CATEGORY No Risk No Risk No Risk        Assessment and Plan:  TIMATHY MIXELL is a 73 y.o. year old male with a history of  anxiety,CKD stage III B secondary to chronic over use of aspirin and NSAID, secondary hyperparathyroidism,  hypertension, cirrhosis of liver secondary to fatty liver, history of prostate cancer,, who presents for follow up appointment for below.    1. Panic disorder History: started to have panic attack on the way to [facility] in 2003, relapsed in panic attacks when tried to taper  off a few years ago. Originally on lexapro  20 mg daily, propranolol  10 mg prn    He denied any significant mood symptoms except self-limiting anxiety when he drives.  Noted that he has been consuming 24 oz of caffeine.  He agrees to try reduce the amount to mitigate risk of anxiety.  Will continue Lexapro  as maintenance treatment  for panic disorder. He expressed understanding that his care can be transferred to PCP if he prefers/his PCP agrees with this.   # Fatigue He reports non restorative sleep, and does snoring.  Although sleep evaluation is recommended, he is not interested at this time.   # Hypertension He has hypertension on today's evaluation.  He will see a new PCP.  He was advised to discuss this with his provider.      Plan Continue lexapro  20 mg daily (QTc 456 msec on 12/2021. IRBBB, HR 80) Next appointment: 5/5 at 11:30 - on imipramine  for stress induced incontinence - on metoprolol 20 mg daily as needed for anxiety [email or web address]     The patient demonstrates the following risk factors for suicide: Chronic risk factors for suicide include: psychiatric disorder of anxiety . Acute risk factors for suicide include: N/A. Protective factors for this patient include: positive social support, coping skills, and hope for the future. Considering these factors, the overall suicide risk at this point appears to be low. Patient is appropriate for outpatient follow up.     Collaboration of Care: Collaboration of Care: {BH OP Collaboration of Care:21014065}  Patient/Guardian was advised Release of Information must be obtained prior to any record release in order to collaborate their care with an outside provider. Patient/Guardian was advised if they have not already done so to contact the registration department to sign all necessary forms in order for us  to release information regarding their care.   Consent: Patient/Guardian gives verbal consent for treatment and assignment of benefits  for services provided during this visit. Patient/Guardian expressed understanding and agreed to proceed.    Todd Fossa, MD 11/23/2023, 11:43 AM

## 2023-11-23 NOTE — Progress Notes (Signed)
 Patient here today for 2 week Dupixent  injection for Atopic Dermatitis.    Patient injected with Dupixent  300mg  into the left upper arm. Patient tolerated procedure well.    Lot: [id number] Exp: 11/2025   Richard Bean, CMA

## 2023-11-29 ENCOUNTER — Ambulatory Visit: Payer: Self-pay | Admitting: Psychiatry

## 2023-12-06 NOTE — Progress Notes (Deleted)
 BH MD/PA/NP OP Progress Note  12/06/2023 4:55 PM Richard Bean  MRN:  [id number]  Chief Complaint: No chief complaint on file.  HPI: *** - he is not seen since Nov 2024  Alcohol Managed by pcp    Daily routine: go to lake, shoot pool, plays with his dog Exercise: walk 3 miles every day Employment: retired in 2006, Sport and exercise psychologist Support: brothers Household: by himself Marital status: separated, married twice Number of children: 1 daughter in [place], 70 yo He grew up in a farm [place]. He had a "fine" childhood. he loved his parents. He has 2 living brother. He eats lunch with them weekly, occasionally with his cousin as well   Visit Diagnosis: No diagnosis found.  Past Psychiatric History: Please see initial evaluation for full details. I have reviewed the history. No updates at this time.     Past Medical History:  Past Medical History:  Diagnosis Date   Actinic keratosis    Anxiety    Cancer (HCC)    Depression    GERD (gastroesophageal reflux disease)    H/O prostate cancer    Heart murmur    Kidney disease    Liver disease    Panic disorder with agoraphobia     Past Surgical History:  Procedure Laterality Date   ANKLE SURGERY Right    COLONOSCOPY WITH PROPOFOL  N/A 09/25/2021   Procedure: COLONOSCOPY WITH PROPOFOL ;  Surgeon: Selena Daily, MD;  Location: [facility] ENDOSCOPY;  Service: Gastroenterology;  Laterality: N/A;   ESOPHAGOGASTRODUODENOSCOPY (EGD) WITH PROPOFOL  N/A 06/26/2020   Procedure: ESOPHAGOGASTRODUODENOSCOPY (EGD) WITH PROPOFOL ;  Surgeon: Selena Daily, MD;  Location: [facility] ENDOSCOPY;  Service: Gastroenterology;  Laterality: N/A;   ESOPHAGOGASTRODUODENOSCOPY (EGD) WITH PROPOFOL  N/A 10/02/2020   Procedure: ESOPHAGOGASTRODUODENOSCOPY (EGD) WITH PROPOFOL ;  Surgeon: Selena Daily, MD;  Location: [facility] ENDOSCOPY;  Service: Gastroenterology;  Laterality: N/A;   PROSTATE SURGERY      Family Psychiatric History: Please see initial evaluation for  full details. I have reviewed the history. No updates at this time.     Family History:  Family History  Problem Relation Age of Onset   COPD Mother    Prostate cancer Father    Cancer - Lung Father    Depression Sister    Stomach cancer Sister    Alcohol abuse Brother    Heart block Brother    Breast cancer Sister    Prostate cancer Brother    Skin cancer Brother     Social History:  Social History   Socioeconomic History   Marital status: Legally Separated    Spouse name: Not on file   Number of children: 1   Years of education: Not on file   Highest education level: Not on file  Occupational History   Not on file  Tobacco Use   Smoking status: Every Day    Current packs/day: 1.00    Average packs/day: 1 pack/day for 52.6 years (52.6 ttl pk-yrs)    Types: Cigarettes    Start date: 05/06/1971   Smokeless tobacco: Never  Vaping Use   Vaping status: Never Used  Substance and Sexual Activity   Alcohol use: Yes    Alcohol/week: 16.0 standard drinks of alcohol    Types: 6 Cans of beer, 10 Shots of liquor per week    Comment: 2-3 drinks 3 times per week   Drug use: No   Sexual activity: Yes  Other Topics Concern   Not on file  Social History Narrative  Not on file   Social Drivers of Health   Financial Resource Strain: Low Risk  (09/08/2023)   Overall Financial Resource Strain (CARDIA)    Difficulty of Paying Living Expenses: Not hard at all  Food Insecurity: No Food Insecurity (09/08/2023)   Hunger Vital Sign    Worried About Running Out of Food in the Last Year: Never true    Ran Out of Food in the Last Year: Never true  Transportation Needs: No Transportation Needs (09/08/2023)   PRAPARE - Administrator, Civil Service (Medical): No    Lack of Transportation (Non-Medical): No  Physical Activity: Inactive (09/08/2023)   Exercise Vital Sign    Days of Exercise per Week: 0 days    Minutes of Exercise per Session: 0 min  Stress: No Stress Concern  Present (09/08/2023)   [employer] of Occupational Health - Occupational Stress Questionnaire    Feeling of Stress : Not at all  Social Connections: Socially Isolated (09/08/2023)   Social Connection and Isolation Panel [NHANES]    Frequency of Communication with Friends and Family: Three times a week    Frequency of Social Gatherings with Friends and Family: Twice a week    Attends Religious Services: Never    Database administrator or Organizations: No    Attends Banker Meetings: Never    Marital Status: Separated    Allergies:  Allergies  Allergen Reactions   Chlorpheniramine-Phenylephrine Other (See Comments) and Nausea Only    Actifed Makes cold symptoms worse Other reaction(s): Unknown Actifed Makes cold symptoms worse Other reaction(s): Unknown     Metabolic Disorder Labs: Lab Results  Component Value Date   HGBA1C 5.4 09/16/2021   No results found for: "PROLACTIN" Lab Results  Component Value Date   CHOL 147 10/21/2023   TRIG 64 10/21/2023   HDL 87 10/21/2023   CHOLHDL 1.7 10/21/2023   LDLCALC 47 10/21/2023   LDLCALC 47 04/20/2023   Lab Results  Component Value Date   TSH 1.710 09/16/2021   TSH 1.270 03/09/2019    Therapeutic Level Labs: No results found for: "LITHIUM" No results found for: "VALPROATE" No results found for: "CBMZ"  Current Medications: Current Outpatient Medications  Medication Sig Dispense Refill   amLODipine  (NORVASC ) 5 MG tablet Take 1 tablet (5 mg total) by mouth daily. 90 tablet 3   atorvastatin  (LIPITOR) 20 MG tablet TAKE 1 TABLET DAILY 90 tablet 3   Dupilumab  (DUPIXENT ) 300 MG/2ML SOAJ Inject 300 mg into the skin every 14 (fourteen) days. Starting at day 15 for maintenance. 4 mL 5   enalapril  (VASOTEC ) 5 MG tablet Take 1 tablet (5 mg total) by mouth daily. 90 tablet 3   escitalopram  (LEXAPRO ) 20 MG tablet TAKE 1 TABLET DAILY 90 tablet 3   ezetimibe  (ZETIA ) 10 MG tablet TAKE 1 TABLET DAILY 90 tablet 1    imipramine  (TOFRANIL ) 25 MG tablet TAKE 1 TABLET AT BEDTIME 90 tablet 3   omeprazole  (PRILOSEC) 40 MG capsule Take 1 capsule (40 mg total) by mouth 2 (two) times daily before a meal. 180 capsule 1   propranolol  (INDERAL ) 10 MG tablet Take 1 tablet (10 mg total) by mouth daily as needed. 90 tablet 3   sildenafil  (REVATIO ) 20 MG tablet Take 1 tablet (20 mg total) by mouth as directed. 90 tablet 3   Current Facility-Administered Medications  Medication Dose Route Frequency Provider Last Rate Last Admin   dupilumab  (DUPIXENT ) prefilled syringe 300 mg  300 mg  Subcutaneous Q14 Days Elta Halter, MD   300 mg at 11/23/23 1343   Dupilumab  SOAJ 300 mg  300 mg Subcutaneous Q14 Days Elta Halter, MD   300 mg at 11/09/23 1338     Musculoskeletal: Strength & Muscle Tone: within normal limits Gait & Station: normal Patient leans: N/A  Psychiatric Specialty Exam: Review of Systems  There were no vitals taken for this visit.There is no height or weight on file to calculate BMI.  General Appearance: Well Groomed  Eye Contact:  Good  Speech:  Clear and Coherent  Volume:  Normal  Mood:  {BHH MOOD:22306}  Affect:  {Affect (PAA):22687}  Thought Process:  Coherent  Orientation:  Full (Time, Place, and Person)  Thought Content: Logical   Suicidal Thoughts:  {ST/HT (PAA):22692}  Homicidal Thoughts:  {ST/HT (PAA):22692}  Memory:  Immediate;   Good  Judgement:  {Judgement (PAA):22694}  Insight:  {Insight (PAA):22695}  Psychomotor Activity:  Normal  Concentration:  Concentration: Good and Attention Span: Good  Recall:  Good  Fund of Knowledge: Good  Language: Good  Akathisia:  No  Handed:  Right  AIMS (if indicated): not done  Assets:  Social Support  ADL's:  Intact  Cognition: WNL  Sleep:  {BHH GOOD/FAIR/POOR:22877}   Screenings: AUDIT    Garment/textile technologist Visit from 10/06/2023 in [facility] Clinical Support from 09/08/2023 in [facility] Clinical Support from 09/07/2022 in [facility] Clinical Support from 04/04/2020 in [facility]  Alcohol Use Disorder Identification Test Final Score (AUDIT) 4 5 4 4       GAD-7    Flowsheet Row Office Visit from 03/17/2023 in [facility] Office Visit from 06/08/2022 in [facility]  Total GAD-7 Score 0 0      PHQ2-9    Flowsheet Row Clinical Support from 09/08/2023 in [facility] Office Visit from 03/17/2023 in [facility] Office Visit from 09/10/2022 in [facility] Clinical Support from 09/07/2022 in [facility] Office Visit from 06/08/2022 in [facility]  PHQ-2 Total Score 0 0 0 0 2  PHQ-9 Total Score -- 2 0 -- 3      Flowsheet Row Admission (Discharged) from 09/25/2021 in [facility] Admission (Discharged) from 09/24/2021 in [facility] Video Visit from 05/20/2021 in [facility]  C-SSRS RISK CATEGORY No Risk No Risk No Risk        Assessment and Plan:  Richard Bean is a 73 y.o. year old male with a history of  anxiety,CKD stage III B secondary to chronic over use of aspirin and NSAID, secondary hyperparathyroidism,  hypertension, cirrhosis of liver secondary to fatty liver, history of prostate cancer,, who presents for follow up appointment for below.    1. Panic disorder History: started to have panic attack on the way to [facility] in 2003, relapsed in panic attacks when tried to taper off a few years ago. Originally on lexapro  20 mg daily, propranolol  10 mg prn    He denied any significant mood symptoms except self-limiting anxiety when he drives.  Noted that he has been consuming 24 oz of caffeine.  He  agrees to try reduce the amount to mitigate risk of anxiety.  Will continue Lexapro  as maintenance treatment for panic disorder. He  expressed understanding that his care can be transferred to PCP if he prefers/his PCP agrees with this.   # Fatigue He reports non restorative sleep, and does snoring.  Although sleep evaluation is recommended, he is not interested at this time.   # Hypertension He has hypertension on today's evaluation.  He will see a new PCP.  He was advised to discuss this with his provider.      Plan Continue lexapro  20 mg daily (QTc 456 msec on 12/2021. IRBBB, HR 80) Next appointment: 5/5 at 11:30 - on imipramine  for stress induced incontinence - on metoprolol 20 mg daily as needed for anxiety [email or web address]     The patient demonstrates the following risk factors for suicide: Chronic risk factors for suicide include: psychiatric disorder of anxiety . Acute risk factors for suicide include: N/A. Protective factors for this patient include: positive social support, coping skills, and hope for the future. Considering these factors, the overall suicide risk at this point appears to be low. Patient is appropriate for outpatient follow up.     Collaboration of Care: Collaboration of Care: {BH OP Collaboration of Care:21014065}  Patient/Guardian was advised Release of Information must be obtained prior to any record release in order to collaborate their care with an outside provider. Patient/Guardian was advised if they have not already done so to contact the registration department to sign all necessary forms in order for us  to release information regarding their care.   Consent: Patient/Guardian gives verbal consent for treatment and assignment of benefits for services provided during this visit. Patient/Guardian expressed understanding and agreed to proceed.    Todd Fossa, MD 12/06/2023, 4:55 PM

## 2023-12-07 ENCOUNTER — Ambulatory Visit

## 2023-12-07 DIAGNOSIS — L209 Atopic dermatitis, unspecified: Secondary | ICD-10-CM | POA: Diagnosis not present

## 2023-12-07 NOTE — Progress Notes (Signed)
 Patient here today for 2 week Dupixent  injection for Atopic Dermatitis.    Patient injected with Dupixent  300mg  into the right upper arm. Patient tolerated procedure well.    Lot: [id number] Exp: 07/26/2025   Lisbeth Rides, RMA

## 2023-12-13 ENCOUNTER — Ambulatory Visit: Admitting: Psychiatry

## 2023-12-15 ENCOUNTER — Ambulatory Visit: Payer: Medicare Other | Admitting: Dermatology

## 2023-12-21 ENCOUNTER — Ambulatory Visit

## 2023-12-21 DIAGNOSIS — L209 Atopic dermatitis, unspecified: Secondary | ICD-10-CM

## 2023-12-21 NOTE — Progress Notes (Signed)
 Patient here today for 2 week Dupixent  injection for Atopic Dermatitis.    Patient injected with Dupixent  300mg  into the left upper arm. Patient tolerated procedure well.    Lot: [id number] Exp: 07/26/2025   Mara Seminole CMA, AAMA

## 2024-01-04 ENCOUNTER — Ambulatory Visit

## 2024-01-04 DIAGNOSIS — L209 Atopic dermatitis, unspecified: Secondary | ICD-10-CM

## 2024-01-04 NOTE — Progress Notes (Signed)
 Patient here today for 2 week Dupixent  injection for Atopic Dermatitis.    Patient injected with Dupixent  300mg  into the right upper arm. Patient tolerated procedure well.    Lot: [id number] Exp: 08/26/2025   Lisbeth Rides, RMA

## 2024-01-18 ENCOUNTER — Ambulatory Visit (INDEPENDENT_AMBULATORY_CARE_PROVIDER_SITE_OTHER)

## 2024-01-18 DIAGNOSIS — L209 Atopic dermatitis, unspecified: Secondary | ICD-10-CM | POA: Diagnosis not present

## 2024-01-18 NOTE — Progress Notes (Signed)
 Patient here today for 2 week Dupixent  injection for Atopic Dermatitis.    Patient injected with Dupixent  300mg  pen into the left upper arm. Patient tolerated procedure well.    Lot: [id number] Exp: 08/26/2025  Arfa Lamarca V. Wilfred, CMA

## 2024-01-31 ENCOUNTER — Other Ambulatory Visit: Payer: Self-pay | Admitting: Physician Assistant

## 2024-02-01 ENCOUNTER — Ambulatory Visit

## 2024-02-01 DIAGNOSIS — L209 Atopic dermatitis, unspecified: Secondary | ICD-10-CM | POA: Diagnosis not present

## 2024-02-01 NOTE — Progress Notes (Signed)
 Patient here today for 2 week Dupixent  injection for Atopic Dermatitis.    Patient injected with Dupixent  300mg  into the right upper arm. Patient tolerated procedure well.    Lot: [id number] Exp: 08/26/2025   Lisbeth Rides, RMA

## 2024-02-15 ENCOUNTER — Ambulatory Visit

## 2024-02-15 DIAGNOSIS — L209 Atopic dermatitis, unspecified: Secondary | ICD-10-CM | POA: Diagnosis not present

## 2024-02-15 NOTE — Progress Notes (Signed)
 Patient here today for 2 week Dupixent  injection for Atopic Dermatitis.    Patient injected with Dupixent  300mg  into the left upper arm. Patient tolerated procedure well.    Lot: [id number] Exp: 02/23/26   Alan Pizza, RMA

## 2024-02-22 ENCOUNTER — Telehealth: Payer: Self-pay | Admitting: Family Medicine

## 2024-02-22 MED ORDER — PROPRANOLOL HCL 10 MG PO TABS: 10.0000 mg | ORAL_TABLET | Freq: Every day | ORAL | 3 refills | Status: AC | PRN

## 2024-02-22 NOTE — Telephone Encounter (Signed)
 [facility] is requesting refills on Propranolol  10 mg. #90 with 3 RF.

## 2024-02-22 NOTE — Addendum Note (Signed)
 Addended by: LILIAN SEVERO RAMAN on: 02/22/2024 10:44 AM   Modules accepted: Orders

## 2024-02-29 ENCOUNTER — Ambulatory Visit

## 2024-02-29 DIAGNOSIS — N2581 Secondary hyperparathyroidism of renal origin: Secondary | ICD-10-CM | POA: Diagnosis not present

## 2024-02-29 DIAGNOSIS — E871 Hypo-osmolality and hyponatremia: Secondary | ICD-10-CM | POA: Diagnosis not present

## 2024-02-29 DIAGNOSIS — N1831 Chronic kidney disease, stage 3a: Secondary | ICD-10-CM | POA: Diagnosis not present

## 2024-02-29 DIAGNOSIS — I129 Hypertensive chronic kidney disease with stage 1 through stage 4 chronic kidney disease, or unspecified chronic kidney disease: Secondary | ICD-10-CM | POA: Diagnosis not present

## 2024-02-29 DIAGNOSIS — L209 Atopic dermatitis, unspecified: Secondary | ICD-10-CM | POA: Diagnosis not present

## 2024-02-29 NOTE — Progress Notes (Signed)
 Patient here today for 2 week Dupixent  injection for Atopic Dermatitis.    Patient injected with Dupixent  300mg  into the right upper arm. Patient tolerated procedure well.    Lot: [id number] Exp: 02/23/26   Alan Pizza, RMA

## 2024-03-07 ENCOUNTER — Other Ambulatory Visit: Payer: Self-pay | Admitting: Acute Care

## 2024-03-07 DIAGNOSIS — Z87891 Personal history of nicotine dependence: Secondary | ICD-10-CM

## 2024-03-07 DIAGNOSIS — F17210 Nicotine dependence, cigarettes, uncomplicated: Secondary | ICD-10-CM

## 2024-03-07 DIAGNOSIS — Z122 Encounter for screening for malignant neoplasm of respiratory organs: Secondary | ICD-10-CM

## 2024-03-09 DIAGNOSIS — N1831 Chronic kidney disease, stage 3a: Secondary | ICD-10-CM | POA: Diagnosis not present

## 2024-03-09 DIAGNOSIS — I129 Hypertensive chronic kidney disease with stage 1 through stage 4 chronic kidney disease, or unspecified chronic kidney disease: Secondary | ICD-10-CM | POA: Diagnosis not present

## 2024-03-09 DIAGNOSIS — N2581 Secondary hyperparathyroidism of renal origin: Secondary | ICD-10-CM | POA: Diagnosis not present

## 2024-03-09 DIAGNOSIS — E875 Hyperkalemia: Secondary | ICD-10-CM | POA: Diagnosis not present

## 2024-03-09 DIAGNOSIS — E871 Hypo-osmolality and hyponatremia: Secondary | ICD-10-CM | POA: Diagnosis not present

## 2024-03-14 ENCOUNTER — Ambulatory Visit

## 2024-03-14 DIAGNOSIS — L209 Atopic dermatitis, unspecified: Secondary | ICD-10-CM

## 2024-03-14 NOTE — Progress Notes (Signed)
 Patient here today for 2 week Dupixent  injection for Atopic Dermatitis.    Patient injected with Dupixent  300mg  into the left upper arm. Patient tolerated procedure well.    Lot: [id number] Exp: 02/23/2026   Alan Pizza, RMA

## 2024-03-23 ENCOUNTER — Ambulatory Visit
Admission: RE | Admit: 2024-03-23 | Discharge: 2024-03-23 | Disposition: A | Discharge status: Home / Self Care | Source: Ambulatory Visit | Attending: Acute Care | Admitting: Acute Care

## 2024-03-23 DIAGNOSIS — Z87891 Personal history of nicotine dependence: Secondary | ICD-10-CM | POA: Diagnosis not present

## 2024-03-23 DIAGNOSIS — Z122 Encounter for screening for malignant neoplasm of respiratory organs: Secondary | ICD-10-CM | POA: Insufficient documentation

## 2024-03-23 DIAGNOSIS — F17210 Nicotine dependence, cigarettes, uncomplicated: Secondary | ICD-10-CM | POA: Insufficient documentation

## 2024-03-28 ENCOUNTER — Ambulatory Visit (INDEPENDENT_AMBULATORY_CARE_PROVIDER_SITE_OTHER)

## 2024-03-28 DIAGNOSIS — L209 Atopic dermatitis, unspecified: Secondary | ICD-10-CM | POA: Diagnosis not present

## 2024-03-28 NOTE — Progress Notes (Signed)
 Patient here today for 2 week Dupixent  injection for Atopic Dermatitis.    Patient injected with Dupixent  300mg  into the right upper arm. Patient tolerated procedure well.    Lot: [id number] Exp: 02/23/2026   Alan Pizza, RMA

## 2024-04-04 ENCOUNTER — Other Ambulatory Visit: Payer: Self-pay | Admitting: Acute Care

## 2024-04-04 DIAGNOSIS — F17210 Nicotine dependence, cigarettes, uncomplicated: Secondary | ICD-10-CM

## 2024-04-04 DIAGNOSIS — Z122 Encounter for screening for malignant neoplasm of respiratory organs: Secondary | ICD-10-CM

## 2024-04-04 DIAGNOSIS — Z87891 Personal history of nicotine dependence: Secondary | ICD-10-CM

## 2024-04-05 ENCOUNTER — Ambulatory Visit: Admitting: Family Medicine

## 2024-04-05 ENCOUNTER — Encounter: Payer: Self-pay | Admitting: Family Medicine

## 2024-04-05 VITALS — BP 138/82 | HR 91 | Ht 72.0 in | Wt 208.0 lb

## 2024-04-05 DIAGNOSIS — I10 Essential (primary) hypertension: Secondary | ICD-10-CM | POA: Diagnosis not present

## 2024-04-05 DIAGNOSIS — E538 Deficiency of other specified B group vitamins: Secondary | ICD-10-CM

## 2024-04-05 DIAGNOSIS — K7460 Unspecified cirrhosis of liver: Secondary | ICD-10-CM

## 2024-04-05 DIAGNOSIS — N1831 Chronic kidney disease, stage 3a: Secondary | ICD-10-CM

## 2024-04-05 DIAGNOSIS — F3342 Major depressive disorder, recurrent, in full remission: Secondary | ICD-10-CM

## 2024-04-05 DIAGNOSIS — Z716 Tobacco abuse counseling: Secondary | ICD-10-CM

## 2024-04-05 DIAGNOSIS — F331 Major depressive disorder, recurrent, moderate: Secondary | ICD-10-CM

## 2024-04-05 DIAGNOSIS — Z23 Encounter for immunization: Secondary | ICD-10-CM

## 2024-04-05 DIAGNOSIS — J432 Centrilobular emphysema: Secondary | ICD-10-CM

## 2024-04-05 DIAGNOSIS — F17200 Nicotine dependence, unspecified, uncomplicated: Secondary | ICD-10-CM

## 2024-04-05 DIAGNOSIS — F4001 Agoraphobia with panic disorder: Secondary | ICD-10-CM | POA: Diagnosis not present

## 2024-04-05 DIAGNOSIS — E782 Mixed hyperlipidemia: Secondary | ICD-10-CM

## 2024-04-05 DIAGNOSIS — N2581 Secondary hyperparathyroidism of renal origin: Secondary | ICD-10-CM | POA: Diagnosis not present

## 2024-04-05 DIAGNOSIS — K2270 Barrett's esophagus without dysplasia: Secondary | ICD-10-CM | POA: Diagnosis not present

## 2024-04-05 DIAGNOSIS — E559 Vitamin D deficiency, unspecified: Secondary | ICD-10-CM

## 2024-04-05 DIAGNOSIS — R1310 Dysphagia, unspecified: Secondary | ICD-10-CM

## 2024-04-05 MED ORDER — NICOTINE 21-14-7 MG/24HR TD KIT: 1.0000 | PACK | TRANSDERMAL | 0 refills | Status: AC

## 2024-04-05 NOTE — Assessment & Plan Note (Addendum)
 GERD managed with omeprazole  BID by gastroenterology. No current symptoms.  Patient's provider left the clinic he went to; will send referral for patient to re-establish care for this and dysphagia.  Defer to specialist management

## 2024-04-05 NOTE — Progress Notes (Signed)
 Established patient visit   Patient: Richard Bean   DOB: 10-03-1950   73 y.o. Male  MRN: [id number] Visit Date: 04/05/2024  Today's healthcare provider: LAURAINE LOISE BUOY, DO   Chief Complaint  Patient presents with   Hypertension   Subjective    HPI Richard Bean is a 73 year old male who presents for a regular follow-up visit.  He has been consistently taking his medications, including omeprazole  twice daily for reflux, imipramine  for urinary incontinence, and escitalopram  for anxiety. He also takes propranolol  daily, initially prescribed for a writing tremor and possibly for panic attacks. His tremor persists but occasionally shows improvement.  He has a history of hypertension and is on multiple medications including amlodipine , enalapril , and propranolol . He does not monitor his blood pressure at home. He also takes ezetimibe  and atorvastatin  for cholesterol management.  He has a history of Barrett's esophagus and was under the care of a gastroenterologist until his provider left the clinic. He experiences occasional difficulty swallowing, particularly with saliva, but denies any issues while eating. He is unsure if this is a persistent problem.  He smokes a pack of cigarettes daily and has attempted to quit in the past using patches. He wants to quit again and plans to start using nicotine  patches soon. He drinks alcohol daily, typically three to four drinks, and occasionally more at social events. No legal or personal issues related to alcohol consumption.  He has a history of fatty liver disease, previously documented as cirrhosis, and is taking milk thistle as a supplement. No recent changes in his condition.  He takes vitamin D  and B12 supplements as recommended. No current depressive symptoms, and he reports his imipramine  and escitalopram  are used for anxiety management.   From GI Note: Barrett's esophagus without evidence of dysplasia. He also had erosive  esophagitis.        Medications: Outpatient Medications Prior to Visit  Medication Sig   amLODipine  (NORVASC ) 5 MG tablet Take 1 tablet (5 mg total) by mouth daily.   atorvastatin  (LIPITOR) 20 MG tablet TAKE 1 TABLET DAILY   escitalopram  (LEXAPRO ) 20 MG tablet TAKE 1 TABLET DAILY   imipramine  (TOFRANIL ) 25 MG tablet TAKE 1 TABLET AT BEDTIME   omeprazole  (PRILOSEC) 40 MG capsule Take 1 capsule (40 mg total) by mouth 2 (two) times daily before a meal.   propranolol  (INDERAL ) 10 MG tablet Take 1 tablet (10 mg total) by mouth daily as needed.   sildenafil  (REVATIO ) 20 MG tablet Take 1 tablet (20 mg total) by mouth as directed.   [DISCONTINUED] Dupilumab  (DUPIXENT ) 300 MG/2ML SOAJ Inject 300 mg into the skin every 14 (fourteen) days. Starting at day 15 for maintenance.   [DISCONTINUED] enalapril  (VASOTEC ) 5 MG tablet Take 1 tablet (5 mg total) by mouth daily.   [DISCONTINUED] ezetimibe  (ZETIA ) 10 MG tablet TAKE 1 TABLET DAILY   No facility-administered medications prior to visit.        Objective    BP 138/82 (BP Location: Right Arm, Patient Position: Sitting, Cuff Size: Normal)   Pulse 91   Ht 6' (1.829 m)   Wt 208 lb (94.3 kg)   SpO2 97%   BMI 28.21 kg/m     Physical Exam Vitals and nursing note reviewed.  Constitutional:      General: He is not in acute distress.    Appearance: Normal appearance.  HENT:     Head: Normocephalic and atraumatic.  Eyes:  General: No scleral icterus.    Conjunctiva/sclera: Conjunctivae normal.  Cardiovascular:     Rate and Rhythm: Normal rate.  Pulmonary:     Effort: Pulmonary effort is normal.  Neurological:     Mental Status: He is alert and oriented to person, place, and time. Mental status is at baseline.  Psychiatric:        Mood and Affect: Mood normal.        Behavior: Behavior normal.      No results found for any visits on 04/05/24.  Assessment & Plan    Centrilobular emphysema (HCC)  Nicotine  dependence with  current use -     Nicotine ; Place 1 kit onto the skin as directed. Use as directed  Dispense: 1 kit; Refill: 0  Encounter for smoking cessation counseling -     Nicotine ; Place 1 kit onto the skin as directed. Use as directed  Dispense: 1 kit; Refill: 0  Primary hypertension  Mixed hyperlipidemia  Moderate recurrent major depression (HCC)  Panic disorder with agoraphobia and mild panic attacks  Stage 3a chronic kidney disease (HCC)  Secondary hyperparathyroidism of renal origin  Vitamin D  deficiency -     VITAMIN D  25 Hydroxy (Vit-D Deficiency, Fractures)  Vitamin B12 deficiency -     Vitamin B12  Barrett's esophagus without dysplasia Assessment & Plan: GERD managed with omeprazole  BID by gastroenterology. No current symptoms.  Patient's provider left the clinic he went to; will send referral for patient to re-establish care for this and dysphagia.  Defer to specialist management  Orders: -     Ambulatory referral to Gastroenterology  Dysphagia, unspecified type -     Ambulatory referral to Gastroenterology  Cirrhosis, non-alcoholic (HCC) -     Ambulatory referral to Gastroenterology  Flu vaccine need -     Flu vaccine HIGH DOSE PF(Fluzone Trivalent)      Centrilobular emphysema No dyspnea or inhaler use reported.  Nicotine  dependence with current use; smoking cessation counseling Smoking one pack per day. Interested in quitting. Plans to start nicotine  patches. - Send prescription for nicotine  patches to [facility].  Primary hypertension Slightly elevated blood pressure. Consistent medication use. Propranolol  used as needed for panic attacks and possible blood pressure control.  Mixed hyperlipidemia Continue ezetimibe  and Lipitor for cholesterol management.  LDL 47 on 10/21/2023.  No changes today.  Urinary incontinence Worsening symptoms. Taking imipramine  as prescribed by urologist.  Moderate recurrent major depression; panic disorder with  agoraphobia and mild panic attacks Chronic, stable.  Well-controlled on imipramine  and escitalopram .  No current depression symptoms.  No changes today.  Stage 3a chronic kidney disease; secondary hyperparathyroidism of renal origin Chronic, stable.  Follows with nephrology, Dr. Douglas.  Defer to specialist management.  Vitamin D  deficiency; vitamin B12 deficiency - Order recheck of vitamin D  B12 and vitamin D .  General Health Maintenance - Order flu vaccine.    Follow-Up - Follow up with gastroenterologist for liver evaluation and dysphagia. - Send referral to [facility] for gastroenterology follow-up.  Return in about 6 months (around 10/03/2024) for Chronic f/u.      I discussed the assessment and treatment plan with the patient  The patient was provided an opportunity to ask questions and all were answered. The patient agreed with the plan and demonstrated an understanding of the instructions.   The patient was advised to call back or seek an in-person evaluation if the symptoms worsen or if the condition fails to improve as anticipated.  LAURAINE LOISE BUOY, DO  [facility], [facility] [phone] (phone) [phone] (fax)  [facility]

## 2024-04-05 NOTE — Patient Instructions (Signed)
 Call GI doctor to schedule a follow-up appointment

## 2024-04-10 ENCOUNTER — Ambulatory Visit

## 2024-04-10 DIAGNOSIS — L209 Atopic dermatitis, unspecified: Secondary | ICD-10-CM | POA: Diagnosis not present

## 2024-04-10 MED ORDER — DUPILUMAB 300 MG/2ML ~~LOC~~ SOAJ
300.0000 mg | Freq: Once | SUBCUTANEOUS | Status: AC
  Administered 2024-04-10: 300 mg via SUBCUTANEOUS

## 2024-04-10 NOTE — Progress Notes (Signed)
 Patient here today for 2 week Dupixent  injection for Atopic Dermatitis.    Patient injected with Dupixent  300mg  into the left upper arm. Patient tolerated procedure well.    Lot: [id number] Exp: 04/25/2026   Alan Pizza, RMA

## 2024-04-11 ENCOUNTER — Telehealth: Payer: Self-pay | Admitting: Family Medicine

## 2024-04-11 ENCOUNTER — Other Ambulatory Visit: Payer: Self-pay

## 2024-04-11 MED ORDER — ENALAPRIL MALEATE 5 MG PO TABS: 5.0000 mg | ORAL_TABLET | Freq: Every day | ORAL | 3 refills | Status: AC

## 2024-04-11 NOTE — Telephone Encounter (Signed)
 Rx sent.

## 2024-04-11 NOTE — Telephone Encounter (Signed)
 [facility] is asking for refills on Enalapril  Maleate Tabs 5 mg. #90 with 3 RF

## 2024-04-17 ENCOUNTER — Ambulatory Visit (INDEPENDENT_AMBULATORY_CARE_PROVIDER_SITE_OTHER): Admitting: Dermatology

## 2024-04-17 ENCOUNTER — Encounter: Payer: Self-pay | Admitting: Dermatology

## 2024-04-17 DIAGNOSIS — L2089 Other atopic dermatitis: Secondary | ICD-10-CM | POA: Diagnosis not present

## 2024-04-17 DIAGNOSIS — Z79899 Other long term (current) drug therapy: Secondary | ICD-10-CM

## 2024-04-17 DIAGNOSIS — D692 Other nonthrombocytopenic purpura: Secondary | ICD-10-CM | POA: Diagnosis not present

## 2024-04-17 DIAGNOSIS — W908XXA Exposure to other nonionizing radiation, initial encounter: Secondary | ICD-10-CM

## 2024-04-17 DIAGNOSIS — Z7189 Other specified counseling: Secondary | ICD-10-CM

## 2024-04-17 DIAGNOSIS — L578 Other skin changes due to chronic exposure to nonionizing radiation: Secondary | ICD-10-CM

## 2024-04-17 MED ORDER — DUPIXENT 300 MG/2ML ~~LOC~~ SOAJ: 300.0000 mg | SUBCUTANEOUS | 5 refills | Status: AC

## 2024-04-17 NOTE — Progress Notes (Signed)
   Follow-Up Visit   Subjective  Richard Bean is a 73 y.o. male who presents for the following: Atopic Dermatitis. On Dupixent  since 07/15/2023. Reports he does not have any active rash today. States itching has resolved.   The following portions of the chart were reviewed this encounter and updated as appropriate: medications, allergies, medical history  Review of Systems:  No other skin or systemic complaints except as noted in HPI or Assessment and Plan.  Objective  Well appearing patient in no apparent distress; mood and affect are within normal limits.  Areas Examined: Face, arms, hands, torso  Relevant physical exam findings are noted in the Assessment and Plan.   Assessment & Plan   Purpura - Chronic; persistent and recurrent.  Treatable, but not curable. - Violaceous macules and patches - Benign - Related to trauma, age, sun damage and/or use of blood thinners, chronic use of topical and/or oral steroids - Observe - Can use OTC arnica containing moisturizer such as Dermend Bruise Formula if desired - Call for worsening or other concerns  ACTINIC DAMAGE - chronic, secondary to cumulative UV radiation exposure/sun exposure over time - diffuse scaly erythematous macules with underlying dyspigmentation - Recommend daily broad spectrum sunscreen SPF 30+ to sun-exposed areas, reapply every 2 hours as needed.  - Recommend staying in the shade or wearing long sleeves, sun glasses (UVA+UVB protection) and wide brim hats (4-inch brim around the entire circumference of the hat). - Call for new or changing lesions.    ATOPIC DERMATITIS Exam: xerosis at torso and extremities  5% BSA Chronic and persistent condition with duration or expected duration over one year. Condition is improving with treatment but not currently at goal. Atopic dermatitis - Severe, on Dupixent  (biologic medication).  Atopic dermatitis (eczema) is a chronic, relapsing, pruritic condition that can  significantly affect quality of life. It is often associated with allergic rhinitis and/or asthma and can require treatment with topical medications, phototherapy, or in severe cases biologic medications, which require long term medication management.   Treatment Plan: Continue Dupixent  300 mg/2mL SQ QOW. Patient denies side effects. Can decrease to once a month or D/C to see if remains controlled. If flares resume every 2 weeks.    Potential side effects include allergic reaction, herpes infections, injection site reactions and conjunctivitis (inflammation of the eyes).  The use of Dupixent  requires long term medication management, including periodic office visits.    Long term medication management.  Patient is using long term (months to years) prescription medication  to control their dermatologic condition.  These medications require periodic monitoring to evaluate for efficacy and side effects and may require periodic laboratory monitoring.   Recommend gentle skin care.   Return in about 6 months (around 10/15/2024) for Atopic Dermatitis Follow Up, TBSE.  I, Kate Fought, CMA, am acting as scribe for Alm Rhyme, MD.   Documentation: I have reviewed the above documentation for accuracy and completeness, and I agree with the above.  Alm Rhyme, MD

## 2024-04-17 NOTE — Patient Instructions (Signed)
 Continue Dupixent  300 mg/2mL SQ QOW. Patient denies side effects. Can decrease to once a month or D/C to see if remains controlled. If flares resume every 2 weeks.      Potential side effects include allergic reaction, herpes infections, injection site reactions and conjunctivitis (inflammation of the eyes).  The use of Dupixent  requires long term medication management, including periodic office visits.      Due to recent changes in healthcare laws, you may see results of your pathology and/or laboratory studies on MyChart before the doctors have had a chance to review them. We understand that in some cases there may be results that are confusing or concerning to you. Please understand that not all results are received at the same time and often the doctors may need to interpret multiple results in order to provide you with the best plan of care or course of treatment. Therefore, we ask that you please give us  2 business days to thoroughly review all your results before contacting the office for clarification. Should we see a critical lab result, you will be contacted sooner.   If You Need Anything After Your Visit  If you have any questions or concerns for your doctor, please call our main line at [phone] and press option 4 to reach your doctor's medical assistant. If no one answers, please leave a voicemail as directed and we will return your call as soon as possible. Messages left after 4 pm will be answered the following business day.   You may also send us  a message via MyChart. We typically respond to MyChart messages within 1-2 business days.  For prescription refills, please ask your pharmacy to contact our office. Our fax number is [phone].  If you have an urgent issue when the clinic is closed that cannot wait until the next business day, you can page your doctor at the number below.    Please note that while we do our best to be available for urgent issues outside of office  hours, we are not available 24/7.   If you have an urgent issue and are unable to reach us , you may choose to seek medical care at your doctor's office, retail clinic, urgent care center, or emergency room.  If you have a medical emergency, please immediately call 911 or go to the emergency department.  Pager Numbers  - Dr. Hester: [phone]  - Dr. Jackquline: [phone]  - Dr. Claudene: [phone]   - Dr. Raymund: [phone]  In the event of inclement weather, please call our main line at [phone] for an update on the status of any delays or closures.  Dermatology Medication Tips: Please keep the boxes that topical medications come in in order to help keep track of the instructions about where and how to use these. Pharmacies typically print the medication instructions only on the boxes and not directly on the medication tubes.   If your medication is too expensive, please contact our office at [phone] option 4 or send us  a message through MyChart.   We are unable to tell what your co-pay for medications will be in advance as this is different depending on your insurance coverage. However, we may be able to find a substitute medication at lower cost or fill out paperwork to get insurance to cover a needed medication.   If a prior authorization is required to get your medication covered by your insurance company, please allow us  1-2 business days to complete this process.  Drug prices often  vary depending on where the prescription is filled and some pharmacies may offer cheaper prices.  The website www.goodrx.com contains coupons for medications through different pharmacies. The prices here do not account for what the cost may be with help from insurance (it may be cheaper with your insurance), but the website can give you the price if you did not use any insurance.  - You can print the associated coupon and take it with your prescription to the pharmacy.  - You may also  stop by our office during regular business hours and pick up a GoodRx coupon card.  - If you need your prescription sent electronically to a different pharmacy, notify our office through [facility] or by phone at [phone] option 4.     Si Usted Necesita Algo Despus de Su Visita  Tambin puede enviarnos un mensaje a travs de Clinical cytogeneticist. Por lo general respondemos a los mensajes de MyChart en el transcurso de 1 a 2 das hbiles.  Para renovar recetas, por favor pida a su farmacia que se ponga en contacto con nuestra oficina. Randi lakes de fax es [place] [phone].  Si tiene un asunto urgente cuando la clnica est cerrada y que no puede esperar hasta el siguiente da hbil, puede llamar/localizar a su doctor(a) al nmero que aparece a continuacin.   Por favor, tenga en cuenta que aunque hacemos todo lo posible para estar disponibles para asuntos urgentes fuera del horario de [place], no estamos disponibles las 24 horas del da, los 7 [address]  [address] de la [place].   Si tiene un problema urgente y no puede comunicarse con nosotros, puede optar por buscar atencin mdica  en el consultorio de su doctor(a), en una clnica privada, en un centro de atencin urgente o en una sala de emergencias.  Si tiene Engineer, drilling, por favor llame inmediatamente al 911 o vaya a la sala de emergencias.  Nmeros de bper  - Dr. Hester: [phone]  - Dra. Jackquline: [phone]  - Dr. Claudene: [phone]  - Dra. Kitts: [phone]  En caso de inclemencias del [place], por favor llame a nuestra lnea principal al [phone] para una actualizacin sobre el estado de cualquier retraso o cierre.  Consejos para la medicacin en dermatologa: Por favor, guarde las cajas en las que vienen los medicamentos de uso tpico para ayudarle a seguir las instrucciones sobre dnde y cmo usarlos. Las farmacias generalmente imprimen las instrucciones del medicamento slo en las cajas y no directamente en los  tubos del [place].   Si su medicamento es muy caro, por favor, pngase en contacto con landry rieger llamando al [phone] y presione la opcin 4 o envenos un mensaje a travs de Clinical cytogeneticist.   No podemos decirle cul ser su copago por los medicamentos por adelantado ya que esto es diferente dependiendo de la cobertura de su seguro. Sin embargo, es posible que podamos encontrar un medicamento sustituto a Audiological scientist un formulario para que el seguro cubra el medicamento que se considera necesario.   Si se requiere una autorizacin previa para que su compaa de seguros malta su medicamento, por favor permtanos de 1 a 2 das hbiles para completar este proceso.  Los precios de los medicamentos varan con frecuencia dependiendo del Environmental consultant de dnde se surte la receta y alguna farmacias pueden ofrecer precios ms baratos.  El sitio web www.goodrx.com tiene cupones para medicamentos de Health and safety inspector. Los precios aqu no tienen en cuenta lo que podra costar con la ayuda del seguro (puede  ser ms barato con su seguro), pero el sitio web puede darle el precio si no Visual merchandiser.  - Puede imprimir el cupn correspondiente y llevarlo con su receta a la farmacia.  - Tambin puede pasar por nuestra oficina durante el horario de atencin regular y Education officer, museum una tarjeta de cupones de GoodRx.  - Si necesita que su receta se enve electrnicamente a una farmacia diferente, informe a nuestra oficina a travs de MyChart de [place] o por telfono llamando al [phone] y presione la opcin 4.

## 2024-04-19 ENCOUNTER — Other Ambulatory Visit: Payer: Self-pay | Admitting: Cardiovascular Disease

## 2024-04-24 ENCOUNTER — Ambulatory Visit

## 2024-04-24 DIAGNOSIS — L2089 Other atopic dermatitis: Secondary | ICD-10-CM | POA: Diagnosis not present

## 2024-04-24 MED ORDER — DUPILUMAB 300 MG/2ML ~~LOC~~ SOAJ
300.0000 mg | SUBCUTANEOUS | Status: AC
  Administered 2024-04-24 – 2024-08-29 (×10): 300 mg via SUBCUTANEOUS

## 2024-04-24 NOTE — Addendum Note (Signed)
 Addended by: DONZELLA DOMINO on: 04/24/2024 07:39 PM   Modules accepted: Level of Service

## 2024-04-24 NOTE — Progress Notes (Signed)
 Patient here today for 2 week Dupixent  injection for Atopic Dermatitis.    Patient injected with Dupixent  300mg  into the right upper arm. Patient tolerated procedure well.    Lot: [id number] Exp: 04/25/2026   Alan Pizza, RMA

## 2024-04-28 ENCOUNTER — Other Ambulatory Visit: Payer: Self-pay | Admitting: Dermatology

## 2024-05-08 ENCOUNTER — Ambulatory Visit

## 2024-05-08 DIAGNOSIS — L2089 Other atopic dermatitis: Secondary | ICD-10-CM

## 2024-05-08 NOTE — Progress Notes (Signed)
 Patient here today for 2 week Dupixent  injection for Atopic Dermatitis.    Patient injected with Dupixent  300mg  into the left upper arm. Patient tolerated procedure well.    Lot: [id number] Exp: 04/25/2026   Alan Pizza, RMA

## 2024-05-12 ENCOUNTER — Other Ambulatory Visit: Payer: Self-pay | Admitting: Cardiovascular Disease

## 2024-05-12 ENCOUNTER — Other Ambulatory Visit: Payer: Self-pay | Admitting: Family Medicine

## 2024-05-12 ENCOUNTER — Telehealth: Payer: Self-pay | Admitting: Family Medicine

## 2024-05-12 DIAGNOSIS — F3342 Major depressive disorder, recurrent, in full remission: Secondary | ICD-10-CM

## 2024-05-12 MED ORDER — AMLODIPINE BESYLATE 5 MG PO TABS: 5.0000 mg | ORAL_TABLET | Freq: Every day | ORAL | 1 refills | Status: AC

## 2024-05-12 NOTE — Telephone Encounter (Signed)
[facility] faxed refill request for the following medications:  amLODipine (NORVASC) 5 MG tablet   Please advise.

## 2024-05-15 ENCOUNTER — Other Ambulatory Visit: Payer: Self-pay

## 2024-05-15 MED ORDER — TRIAMCINOLONE ACETONIDE 0.1 % EX CREA: TOPICAL_CREAM | CUTANEOUS | 3 refills | Status: DC

## 2024-05-22 ENCOUNTER — Ambulatory Visit

## 2024-05-22 DIAGNOSIS — L2089 Other atopic dermatitis: Secondary | ICD-10-CM | POA: Diagnosis not present

## 2024-05-22 NOTE — Progress Notes (Signed)
 Patient here today for 2 week Dupixent  injection for Atopic Dermatitis.    Patient injected with Dupixent  300mg  into the right upper arm. Patient tolerated procedure well.    Lot: [id number] Exp: 04/25/2026   Alan Pizza, RMA

## 2024-06-05 ENCOUNTER — Other Ambulatory Visit: Payer: Self-pay

## 2024-06-05 ENCOUNTER — Ambulatory Visit

## 2024-06-05 DIAGNOSIS — L2089 Other atopic dermatitis: Secondary | ICD-10-CM

## 2024-06-05 NOTE — Progress Notes (Signed)
 Patient wants RX d/c. aw

## 2024-06-05 NOTE — Progress Notes (Signed)
 Patient here today for 2 week Dupixent  injection for Atopic Dermatitis.    Patient injected with Dupixent  300mg  into the left upper arm. Patient tolerated procedure well.    Lot: [id number] Exp: 07/26/2026   Alan Pizza, RMA

## 2024-06-19 ENCOUNTER — Ambulatory Visit

## 2024-06-19 DIAGNOSIS — L2089 Other atopic dermatitis: Secondary | ICD-10-CM

## 2024-06-19 NOTE — Progress Notes (Signed)
 Patient here today for 2 week Dupixent  injection for Atopic Dermatitis.    Patient injected with Dupixent  300mg  into the right upper arm. Patient tolerated procedure well.    Lot: [id number] Exp: 07/26/2026   Alan Pizza, RMA

## 2024-06-29 ENCOUNTER — Other Ambulatory Visit: Payer: Self-pay | Admitting: Urology

## 2024-06-29 DIAGNOSIS — F331 Major depressive disorder, recurrent, moderate: Secondary | ICD-10-CM

## 2024-07-04 ENCOUNTER — Ambulatory Visit

## 2024-07-04 DIAGNOSIS — L2089 Other atopic dermatitis: Secondary | ICD-10-CM | POA: Diagnosis not present

## 2024-07-04 NOTE — Progress Notes (Signed)
 Patient here today for 2 week Dupixent  injection for Atopic Dermatitis.    Patient injected with Dupixent  300mg  into the left upper arm. Patient tolerated procedure well.    Lot: [id number] Exp: 07/26/2026   Alan Pizza, RMA

## 2024-07-18 ENCOUNTER — Ambulatory Visit (INDEPENDENT_AMBULATORY_CARE_PROVIDER_SITE_OTHER)

## 2024-07-18 DIAGNOSIS — L2089 Other atopic dermatitis: Secondary | ICD-10-CM | POA: Diagnosis not present

## 2024-07-18 NOTE — Progress Notes (Signed)
 Patient here today for 2 week Dupixent  injection for Atopic Dermatitis.    Patient injected with Dupixent  300mg  into the right upper arm. Patient tolerated procedure well.    Lot: [id number] Exp: 12/25/2025   Alan Pizza, RMA

## 2024-08-01 ENCOUNTER — Ambulatory Visit

## 2024-08-01 DIAGNOSIS — L2089 Other atopic dermatitis: Secondary | ICD-10-CM

## 2024-08-01 NOTE — Progress Notes (Signed)
 Patient here today for 2 week Dupixent  injection for Atopic Dermatitis.    Patient injected with Dupixent  300mg  into the left upper arm. Patient tolerated procedure well.    Lot: [id number] Exp: 07/26/26   Alan Pizza, RMA

## 2024-08-15 ENCOUNTER — Ambulatory Visit

## 2024-08-15 DIAGNOSIS — L2089 Other atopic dermatitis: Secondary | ICD-10-CM

## 2024-08-15 NOTE — Progress Notes (Signed)
 Patient here today for 2 week Dupixent  injection for Atopic Dermatitis.    Patient injected with Dupixent  300mg  into the right upper arm. Patient tolerated procedure well.    Lot: [id number] Exp: 09/24/2026   Alan Pizza, RMA

## 2024-08-29 ENCOUNTER — Ambulatory Visit

## 2024-08-29 ENCOUNTER — Other Ambulatory Visit: Payer: Self-pay | Admitting: Gastroenterology

## 2024-08-29 DIAGNOSIS — K7581 Nonalcoholic steatohepatitis (NASH): Secondary | ICD-10-CM

## 2024-08-29 DIAGNOSIS — L2089 Other atopic dermatitis: Secondary | ICD-10-CM

## 2024-09-12 ENCOUNTER — Ambulatory Visit: Admit: 2024-09-12 | Admitting: Gastroenterology

## 2024-09-13 ENCOUNTER — Ambulatory Visit: Payer: Medicare Other

## 2024-09-14 ENCOUNTER — Ambulatory Visit

## 2024-10-04 ENCOUNTER — Ambulatory Visit: Admitting: Family Medicine

## 2024-10-23 ENCOUNTER — Ambulatory Visit: Admitting: Dermatology
